# Patient Record
Sex: Female | Born: 1935 | Race: Black or African American | Hispanic: No | Smoking: Never smoker
Health system: Southern US, Community
[De-identification: ages and names within clinical notes are randomized; demographics above are authoritative.]

## PROBLEM LIST (undated history)

## (undated) DIAGNOSIS — E119 Type 2 diabetes mellitus without complications: Secondary | ICD-10-CM

## (undated) DIAGNOSIS — F0390 Unspecified dementia without behavioral disturbance: Secondary | ICD-10-CM

## (undated) DIAGNOSIS — I10 Essential (primary) hypertension: Secondary | ICD-10-CM

## (undated) DIAGNOSIS — E785 Hyperlipidemia, unspecified: Secondary | ICD-10-CM

## (undated) DIAGNOSIS — I509 Heart failure, unspecified: Secondary | ICD-10-CM

## (undated) DIAGNOSIS — J449 Chronic obstructive pulmonary disease, unspecified: Secondary | ICD-10-CM

## (undated) DIAGNOSIS — I2510 Atherosclerotic heart disease of native coronary artery without angina pectoris: Secondary | ICD-10-CM

## (undated) HISTORY — PX: AORTIC VALVE REPLACEMENT (AVR)/CORONARY ARTERY BYPASS GRAFTING (CABG): SHX5725

## (undated) HISTORY — DX: Hyperlipidemia, unspecified: E78.5

## (undated) HISTORY — PX: TONSILLECTOMY: SUR1361

## (undated) HISTORY — DX: Unspecified dementia, unspecified severity, without behavioral disturbance, psychotic disturbance, mood disturbance, and anxiety: F03.90

---

## 2004-05-01 ENCOUNTER — Ambulatory Visit: Payer: Self-pay | Admitting: Internal Medicine

## 2005-05-30 ENCOUNTER — Ambulatory Visit: Payer: Self-pay | Admitting: Internal Medicine

## 2007-02-21 ENCOUNTER — Emergency Department: Payer: Self-pay | Admitting: Emergency Medicine

## 2007-06-11 ENCOUNTER — Ambulatory Visit: Payer: Self-pay | Admitting: Internal Medicine

## 2007-08-04 ENCOUNTER — Ambulatory Visit: Payer: Self-pay | Admitting: Family Medicine

## 2008-06-14 ENCOUNTER — Ambulatory Visit: Payer: Self-pay | Admitting: Internal Medicine

## 2010-03-21 ENCOUNTER — Ambulatory Visit: Payer: Self-pay | Admitting: Internal Medicine

## 2011-06-01 ENCOUNTER — Emergency Department: Payer: Self-pay | Admitting: Emergency Medicine

## 2011-11-26 DIAGNOSIS — I2510 Atherosclerotic heart disease of native coronary artery without angina pectoris: Secondary | ICD-10-CM | POA: Insufficient documentation

## 2011-11-26 DIAGNOSIS — Z952 Presence of prosthetic heart valve: Secondary | ICD-10-CM | POA: Insufficient documentation

## 2011-11-26 DIAGNOSIS — E785 Hyperlipidemia, unspecified: Secondary | ICD-10-CM | POA: Insufficient documentation

## 2011-11-26 DIAGNOSIS — H9191 Unspecified hearing loss, right ear: Secondary | ICD-10-CM | POA: Insufficient documentation

## 2011-11-26 DIAGNOSIS — I10 Essential (primary) hypertension: Secondary | ICD-10-CM | POA: Insufficient documentation

## 2012-04-22 DIAGNOSIS — Z7901 Long term (current) use of anticoagulants: Secondary | ICD-10-CM | POA: Insufficient documentation

## 2012-06-16 ENCOUNTER — Ambulatory Visit: Payer: Self-pay

## 2012-12-19 ENCOUNTER — Emergency Department: Payer: Self-pay | Admitting: Emergency Medicine

## 2014-04-21 DIAGNOSIS — I5032 Chronic diastolic (congestive) heart failure: Secondary | ICD-10-CM | POA: Insufficient documentation

## 2015-10-06 DIAGNOSIS — M545 Low back pain, unspecified: Secondary | ICD-10-CM | POA: Insufficient documentation

## 2015-10-06 DIAGNOSIS — E119 Type 2 diabetes mellitus without complications: Secondary | ICD-10-CM | POA: Insufficient documentation

## 2015-10-06 DIAGNOSIS — G8929 Other chronic pain: Secondary | ICD-10-CM | POA: Insufficient documentation

## 2016-01-04 DIAGNOSIS — Z6835 Body mass index (BMI) 35.0-35.9, adult: Secondary | ICD-10-CM | POA: Insufficient documentation

## 2016-01-04 DIAGNOSIS — Z6841 Body Mass Index (BMI) 40.0 and over, adult: Secondary | ICD-10-CM | POA: Insufficient documentation

## 2016-11-16 ENCOUNTER — Emergency Department: Payer: Medicare Other

## 2016-11-16 ENCOUNTER — Emergency Department
Admission: EM | Admit: 2016-11-16 | Discharge: 2016-11-17 | Disposition: A | Discharge status: Home / Self Care | Payer: Medicare Other | Attending: Emergency Medicine | Admitting: Emergency Medicine

## 2016-11-16 ENCOUNTER — Encounter: Payer: Self-pay | Admitting: Emergency Medicine

## 2016-11-16 DIAGNOSIS — Y9301 Activity, walking, marching and hiking: Secondary | ICD-10-CM | POA: Insufficient documentation

## 2016-11-16 DIAGNOSIS — W01198A Fall on same level from slipping, tripping and stumbling with subsequent striking against other object, initial encounter: Secondary | ICD-10-CM | POA: Diagnosis not present

## 2016-11-16 DIAGNOSIS — I110 Hypertensive heart disease with heart failure: Secondary | ICD-10-CM | POA: Insufficient documentation

## 2016-11-16 DIAGNOSIS — J449 Chronic obstructive pulmonary disease, unspecified: Secondary | ICD-10-CM | POA: Insufficient documentation

## 2016-11-16 DIAGNOSIS — S52501A Unspecified fracture of the lower end of right radius, initial encounter for closed fracture: Secondary | ICD-10-CM | POA: Diagnosis not present

## 2016-11-16 DIAGNOSIS — Z7901 Long term (current) use of anticoagulants: Secondary | ICD-10-CM | POA: Diagnosis not present

## 2016-11-16 DIAGNOSIS — Y92002 Bathroom of unspecified non-institutional (private) residence single-family (private) house as the place of occurrence of the external cause: Secondary | ICD-10-CM | POA: Insufficient documentation

## 2016-11-16 DIAGNOSIS — I2510 Atherosclerotic heart disease of native coronary artery without angina pectoris: Secondary | ICD-10-CM | POA: Diagnosis not present

## 2016-11-16 DIAGNOSIS — Z79899 Other long term (current) drug therapy: Secondary | ICD-10-CM | POA: Insufficient documentation

## 2016-11-16 DIAGNOSIS — I509 Heart failure, unspecified: Secondary | ICD-10-CM | POA: Diagnosis not present

## 2016-11-16 DIAGNOSIS — S40021A Contusion of right upper arm, initial encounter: Secondary | ICD-10-CM | POA: Diagnosis not present

## 2016-11-16 DIAGNOSIS — S8011XA Contusion of right lower leg, initial encounter: Secondary | ICD-10-CM | POA: Diagnosis not present

## 2016-11-16 DIAGNOSIS — Y999 Unspecified external cause status: Secondary | ICD-10-CM | POA: Insufficient documentation

## 2016-11-16 DIAGNOSIS — Z7982 Long term (current) use of aspirin: Secondary | ICD-10-CM | POA: Insufficient documentation

## 2016-11-16 DIAGNOSIS — S0003XA Contusion of scalp, initial encounter: Secondary | ICD-10-CM | POA: Diagnosis not present

## 2016-11-16 DIAGNOSIS — S59911A Unspecified injury of right forearm, initial encounter: Secondary | ICD-10-CM | POA: Diagnosis present

## 2016-11-16 DIAGNOSIS — R5381 Other malaise: Secondary | ICD-10-CM

## 2016-11-16 DIAGNOSIS — S60211A Contusion of right wrist, initial encounter: Secondary | ICD-10-CM

## 2016-11-16 DIAGNOSIS — E119 Type 2 diabetes mellitus without complications: Secondary | ICD-10-CM | POA: Insufficient documentation

## 2016-11-16 DIAGNOSIS — R262 Difficulty in walking, not elsewhere classified: Secondary | ICD-10-CM

## 2016-11-16 HISTORY — DX: Essential (primary) hypertension: I10

## 2016-11-16 HISTORY — DX: Chronic obstructive pulmonary disease, unspecified: J44.9

## 2016-11-16 HISTORY — DX: Type 2 diabetes mellitus without complications: E11.9

## 2016-11-16 HISTORY — DX: Heart failure, unspecified: I50.9

## 2016-11-16 HISTORY — DX: Atherosclerotic heart disease of native coronary artery without angina pectoris: I25.10

## 2016-11-16 LAB — CBC WITH DIFFERENTIAL/PLATELET
BASOS ABS: 0.1 10*3/uL (ref 0–0.1)
Basophils Relative: 1 %
Eosinophils Absolute: 0.7 10*3/uL (ref 0–0.7)
Eosinophils Relative: 6 %
HEMATOCRIT: 33.6 % — AB (ref 35.0–47.0)
Hemoglobin: 11 g/dL — ABNORMAL LOW (ref 12.0–16.0)
LYMPHS ABS: 1.1 10*3/uL (ref 1.0–3.6)
Lymphocytes Relative: 9 %
MCH: 29.3 pg (ref 26.0–34.0)
MCHC: 32.8 g/dL (ref 32.0–36.0)
MCV: 89.3 fL (ref 80.0–100.0)
MONO ABS: 0.7 10*3/uL (ref 0.2–0.9)
Monocytes Relative: 6 %
Neutro Abs: 9.3 10*3/uL — ABNORMAL HIGH (ref 1.4–6.5)
Neutrophils Relative %: 78 %
Platelets: 173 10*3/uL (ref 150–440)
RBC: 3.77 MIL/uL — AB (ref 3.80–5.20)
RDW: 18.8 % — AB (ref 11.5–14.5)
WBC: 11.9 10*3/uL — ABNORMAL HIGH (ref 3.6–11.0)

## 2016-11-16 LAB — BASIC METABOLIC PANEL
Anion gap: 6 (ref 5–15)
BUN: 27 mg/dL — AB (ref 6–20)
CHLORIDE: 108 mmol/L (ref 101–111)
CO2: 25 mmol/L (ref 22–32)
CREATININE: 1.01 mg/dL — AB (ref 0.44–1.00)
Calcium: 8.5 mg/dL — ABNORMAL LOW (ref 8.9–10.3)
GFR calc Af Amer: 58 mL/min — ABNORMAL LOW (ref >60)
GFR calc non Af Amer: 50 mL/min — ABNORMAL LOW (ref >60)
Glucose, Bld: 122 mg/dL — ABNORMAL HIGH (ref 65–99)
Potassium: 4.6 mmol/L (ref 3.5–5.1)
Sodium: 139 mmol/L (ref 135–145)

## 2016-11-16 LAB — PROTIME-INR
INR: 3.13
Prothrombin Time: 32.9 seconds — ABNORMAL HIGH (ref 11.4–15.2)

## 2016-11-16 NOTE — ED Triage Notes (Signed)
Pt presents to ED via [facility] from home c/o mechanical fall with head involvement. Pt states she was walking in the bathroom with her knee high stockings on, slipped, and struck head against wall. Denies LOC. Abrasion noted to top of head, bleeding controlled. Hematomas noted to R upper arm and R lower leg. On coumadin.

## 2016-11-16 NOTE — Discharge Instructions (Signed)
Your wrist xray shows a small area of possible fracture at the end of the forearm.  Keep the splint on to protect this area.  Your xrays of the upper arm, hip, and lower leg are unremarkable. Your CT scan of the head is unremarkable.

## 2016-11-16 NOTE — ED Provider Notes (Signed)
[facility] Emergency Department Provider Note  ____________________________________________  Time seen: Approximately 9:49 PM  I have reviewed the triage vital signs and the nursing notes.   HISTORY  Chief Complaint Fall    HPI Rhonda Saunders is a 83 y.o. female reports that about 7 PM tonight she was getting up from the toilet when she slipped on a bathroom rug. She fell onto her right side and hit the top of her head on the wall. Denies loss of consciousness or vomiting. Denies neck pain. She has pain in the right arm hip and shin. Also pain in the right wrist. She is on Coumadin for mechanical heart valve.  Pain has been constant since the fall, worse with movement no alleviating factors. Moderate intensity. Described as Aching.    Past Medical History:  Diagnosis Date  . CHF (congestive heart failure) ([place])   . COPD (chronic obstructive pulmonary disease) ([place])   . Coronary artery disease   . Diabetes mellitus without complication ([place])   . Hypertension      There are no active problems to display for this patient.    Past Surgical History:  Procedure Laterality Date  . AORTIC VALVE REPLACEMENT (AVR)/CORONARY ARTERY BYPASS GRAFTING (CABG)    . TONSILLECTOMY       Prior to Admission medications   Not on File   Coumadin  Allergies Pentazocine lactate [pentazocine]   History reviewed. No pertinent family history.  Social History Social History  Substance Use Topics  . Smoking status: Never Smoker  . Smokeless tobacco: Never Used  . Alcohol use No    Review of Systems  Constitutional:   No fever or chills.  ENT:   No sore throat. No rhinorrhea. Lymphatic: No swollen glands, No extremity swelling Endocrine: No hot/cold flashes. No significant weight change. No neck swelling. Cardiovascular:   No chest pain or syncope. Respiratory:   No dyspnea or cough. Gastrointestinal:   Negative for abdominal pain, vomiting and diarrhea.   Genitourinary:   Negative for dysuria or difficulty urinating. Musculoskeletal:  Diffuse right-sided pain as above. No neck pain. Neurological:   Generalized headache. No numbness tingling weakness or paresthesia.. All other systems reviewed and are negative except as documented above in ROS and HPI.  ____________________________________________   PHYSICAL EXAM:  VITAL SIGNS: ED Triage Vitals  Enc Vitals Group     BP 11/16/16 2100 (!) 148/40     Pulse Rate 11/16/16 2100 66     Resp 11/16/16 2100 18     Temp 11/16/16 2100 97.5 F (36.4 C)     Temp Source 11/16/16 2100 Oral     SpO2 11/16/16 2100 99 %     Weight 11/16/16 2100 260 lb (117.9 kg)     Height 11/16/16 2100 5\' 2"  (1.575 m)     Head Circumference --      Peak Flow --      Pain Score 11/16/16 2059 8     Pain Loc --      Pain Edu? --      Excl. in [place]? --     Vital signs reviewed, nursing assessments reviewed.   Constitutional:   Alert and oriented. Well appearing and in no distress. Eyes:   No scleral icterus. No conjunctival pallor. PERRL. EOMI.  No nystagmus. ENT   Head:   Normocephalic With contusion and skin abrasion at the apex. No laceration. No hemotympanum.. .   Nose:   No congestion/rhinnorhea. No septal hematoma  Mouth/Throat:   MMM, no pharyngeal erythema. No peritonsillar mass.    Neck:   No stridor. No SubQ emphysema. No meningismus. Hematological/Lymphatic/Immunilogical:   No cervical lymphadenopathy. Cardiovascular:   RRR. Symmetric bilateral radial and DP pulses.  No murmurs.  Respiratory:   Normal respiratory effort without tachypnea nor retractions. Breath sounds are clear and equal bilaterally. No wheezes/rales/rhonchi. Gastrointestinal:   Soft and nontender. Non distended. There is no CVA tenderness.  No rebound, rigidity, or guarding. Genitourinary:   deferred Musculoskeletal:   No midline spinal tenderness. Ecchymosis and tenderness diffusely along the humerus. Ecchymosis  tenderness and swelling at the right wrist. Ecchymosis and tenderness along the right tibia. Tenderness at the right hip as well without ecchymosis.. Neurologic:   Normal speech and language.  CN 2-10 normal. Motor grossly intact. No gross focal neurologic deficits are appreciated.  Skin:    Skin is warm, dry and intact. Multiple areas of ecchymosis as above. No rash noted.  No petechiae, purpura, or bullae.  ____________________________________________    LABS (pertinent positives/negatives) (all labs ordered are listed, but only abnormal results are displayed) Labs Reviewed  PROTIME-INR - Abnormal; Notable for the following:       Result Value   Prothrombin Time 32.9 (*)    All other components within normal limits  BASIC METABOLIC PANEL - Abnormal; Notable for the following:    Glucose, Bld 122 (*)    BUN 27 (*)    Creatinine, Ser 1.01 (*)    Calcium 8.5 (*)    GFR calc non Af Amer 50 (*)    GFR calc Af Amer 58 (*)    All other components within normal limits  CBC WITH DIFFERENTIAL/PLATELET - Abnormal; Notable for the following:    WBC 11.9 (*)    RBC 3.77 (*)    Hemoglobin 11.0 (*)    HCT 33.6 (*)    RDW 18.8 (*)    Neutro Abs 9.3 (*)    All other components within normal limits   ____________________________________________   EKG    ____________________________________________    RADIOLOGY  Dg Wrist Complete Right  Result Date: 11/16/2016 CLINICAL DATA:  Pain after fall. EXAM: RIGHT WRIST - COMPLETE 3+ VIEW COMPARISON:  None. FINDINGS: Vascular calcifications are identified. Soft tissue swelling is identified along the radial aspect of the wrist. Mild irregularity of the distal radius. No carpal bone fractures identified. IMPRESSION: Mild irregularity along the radial aspect of the distal radius with overlying soft tissue swelling could represent a very subtle nondisplaced fracture. Electronically Signed   By: Dorise Bullion III M.D   On: 11/16/2016 22:28   Dg  Tibia/fibula Right  Result Date: 11/16/2016 CLINICAL DATA:  Pain after fall EXAM: RIGHT TIBIA AND FIBULA - 2 VIEW COMPARISON:  None. FINDINGS: There is no evidence of fracture or other focal bone lesions. Soft tissues are unremarkable. IMPRESSION: Negative. Electronically Signed   By: Dorise Bullion III M.D   On: 11/16/2016 22:30   Ct Head Wo Contrast  Result Date: 11/16/2016 CLINICAL DATA:  83 year old female with fall and head injury. Patient is on Coumadin. EXAM: CT HEAD WITHOUT CONTRAST TECHNIQUE: Contiguous axial images were obtained from the base of the skull through the vertex without intravenous contrast. COMPARISON:  None. FINDINGS: Brain: The ventricles and sulci appropriate size for patient's age. Mild periventricular and deep white matter chronic microvascular ischemic changes noted. There is no acute intracranial hemorrhage. No mass effect or midline shift noted. No extra-axial fluid collections. Vascular: No hyperdense  vessel or unexpected calcification. Skull: Osteopenia with the mineralization.  No acute fracture. Sinuses/Orbits: Mild mucoperiosteal thickening of paranasal sinuses. No air-fluid levels. The mastoid air cells are clear. Other: None IMPRESSION: 1. No acute intracranial hemorrhage. 2. Mild age-related atrophy and chronic microvascular ischemic changes. Electronically Signed   By: Anner Crete M.D.   On: 11/16/2016 21:50   Dg Humerus Right  Result Date: 11/16/2016 CLINICAL DATA:  Mechanical fall at home. EXAM: RIGHT HUMERUS - 2+ VIEW COMPARISON:  None. FINDINGS: There is no evidence of fracture or other focal bone lesions. Osteopenia. Soft tissues are nonsuspicious. Coarse calcifications project in RIGHT breast, previously described on mammogram March 21, 2010 though images are not available for direct comparison. IMPRESSION: Negative. Electronically Signed   By: Elon Alas M.D.   On: 11/16/2016 22:25   Dg Hip Unilat W Or Wo Pelvis 2-3 Views Right  Result Date:  11/16/2016 CLINICAL DATA:  Pain after fall EXAM: DG HIP (WITH OR WITHOUT PELVIS) 2-3V RIGHT COMPARISON:  None. FINDINGS: There is no evidence of hip fracture or dislocation. There is no evidence of arthropathy or other focal bone abnormality. IMPRESSION: Negative. Electronically Signed   By: Dorise Bullion III M.D   On: 11/16/2016 22:29    ____________________________________________   PROCEDURES Procedures SPLINT APPLICATION Date/Time: 32:44 PM Authorized by: Carrie Mew Consent: Verbal consent obtained. Risks and benefits: risks, benefits and alternatives were discussed Consent given by: patient Splint applied by: orthopedic technician Location details: Right wrist  Splint type: Thumb spica  Supplies used: Ortho-Glass  Post-procedure: The splinted body part was neurovascularly unchanged following the procedure. Patient tolerance: Patient tolerated the procedure well with no immediate complications.    ____________________________________________   INITIAL IMPRESSION / ASSESSMENT AND PLAN / ED COURSE  Pertinent labs & imaging results that were available during my care of the patient were reviewed by me and considered in my medical decision making (see chart for details).    Clinical Course as of Nov 17 2338  Sat Nov 16, 2016  2111 Pt p/w mech. Fall. On coumadin. Multiple areas of pain, ecchymosis. Will get XR. Check CT head. No lacerations.   [PS]    Clinical Course User Index [PS] Carrie Mew, MD    ----------------------------------------- 11:40 PM on 11/16/2016 -----------------------------------------  Workup negative except for a small possible fracture at the distal radius. We'll splint this area with a thumb spica for protection, follow up with primary care and orthopedics. Otherwise medically stable without any significant traumatic injuries. INR is at goal and not supratherapeutic. Suitable for outpatient follow-up. Low suspicion of significant  hemorrhage into the soft tissues or occult traumatic injury.   ____________________________________________   FINAL CLINICAL IMPRESSION(S) / ED DIAGNOSES  Final diagnoses:  Contusion of scalp, initial encounter  Closed fracture of distal end of right radius, unspecified fracture morphology, initial encounter  Contusion of right wrist, initial encounter  Contusion of right upper arm, initial encounter  Contusion of right lower leg, initial encounter      New Prescriptions   No medications on file     Portions of this note were generated with dragon dictation software. Dictation errors may occur despite best attempts at proofreading.    Carrie Mew, MD 11/16/16 2340

## 2016-11-17 DIAGNOSIS — S52501A Unspecified fracture of the lower end of right radius, initial encounter for closed fracture: Secondary | ICD-10-CM | POA: Diagnosis not present

## 2016-11-17 MED ORDER — WARFARIN SODIUM 5 MG PO TABS
5.0000 mg | ORAL_TABLET | Freq: Every day | ORAL | Status: DC
  Filled 2016-11-17: qty 1

## 2016-11-17 MED ORDER — METOPROLOL SUCCINATE ER 50 MG PO TB24
50.0000 mg | ORAL_TABLET | Freq: Every day | ORAL | Status: DC
  Filled 2016-11-17: qty 1

## 2016-11-17 MED ORDER — GABAPENTIN 600 MG PO TABS
600.0000 mg | ORAL_TABLET | Freq: Two times a day (BID) | ORAL | Status: DC
  Filled 2016-11-17: qty 1

## 2016-11-17 MED ORDER — WARFARIN - PHARMACIST DOSING INPATIENT
Freq: Every day | Status: DC
Start: 2016-11-17 — End: 2016-11-17
  Filled 2016-11-17 (×4): qty 1

## 2016-11-17 MED ORDER — OXYCODONE HCL ER 10 MG PO T12A
10.0000 mg | EXTENDED_RELEASE_TABLET | Freq: Two times a day (BID) | ORAL | Status: DC
  Filled 2016-11-17: qty 1

## 2016-11-17 MED ORDER — POTASSIUM CHLORIDE CRYS ER 20 MEQ PO TBCR: 20.0000 meq | EXTENDED_RELEASE_TABLET | Freq: Two times a day (BID) | ORAL | Status: DC

## 2016-11-17 MED ORDER — WARFARIN - PHARMACIST DOSING INPATIENT: Freq: Every day | Status: DC

## 2016-11-17 MED ORDER — ATORVASTATIN CALCIUM 20 MG PO TABS
80.0000 mg | ORAL_TABLET | Freq: Every day | ORAL | Status: DC
  Filled 2016-11-17: qty 4

## 2016-11-17 MED ORDER — RAMIPRIL 10 MG PO CAPS
10.0000 mg | ORAL_CAPSULE | Freq: Every day | ORAL | Status: DC
  Filled 2016-11-17: qty 1

## 2016-11-17 MED ORDER — FUROSEMIDE 40 MG PO TABS
80.0000 mg | ORAL_TABLET | Freq: Every day | ORAL | Status: DC
  Filled 2016-11-17: qty 2

## 2016-11-17 MED ORDER — ASPIRIN EC 81 MG PO TBEC
81.0000 mg | DELAYED_RELEASE_TABLET | Freq: Every day | ORAL | Status: DC
  Filled 2016-11-17: qty 1

## 2016-11-17 MED ORDER — MOMETASONE FURO-FORMOTEROL FUM 200-5 MCG/ACT IN AERO
2.0000 | INHALATION_SPRAY | Freq: Two times a day (BID) | RESPIRATORY_TRACT | Status: DC
  Filled 2016-11-17: qty 8.8

## 2016-11-17 NOTE — ED Notes (Signed)
Ambulated in room with PT using walker with arm shelf.  Patient tolerated well.   See PT note for details.  Patient's daughter at bedside.

## 2016-11-17 NOTE — ED Notes (Signed)
[place] daughter in [employer]

## 2016-11-17 NOTE — ED Notes (Signed)
Verified home medications with Pt's Daughter who had brought in the medications with her. Upon trying to update patients profile, the computer glitched and thinks that Dr. Karma Greaser is in the profile. We attempted several times to fix that but it still is the same. I added the medications the best I could a separate way from the outside reconcile tab and gave the list of meds to Dr Karma Greaser so he could place the orders. The med reconciliation is complete, it just cant be marked complete due to the glitch in the system.

## 2016-11-17 NOTE — ED Provider Notes (Signed)
-----------------------------------------   1:15 AM on 11/17/2016 -----------------------------------------  I have had 2 separate conversations with the patient and her son and daughter who are both present at bedside.  The son agrees with the plan to take the patient home.  The daughter is extremely agitated and angry with our plan for discharge.  She explains that she has been caring for the patient for years and she is the only adult child who has been able and willing to care for her.  She says the patient has a hard enough time getting up and around now, and after her fall and with a splint on her right hand she cannot care for herself.  Nursing confirms that the patient was not able to get up to her feet even with the assistance of a person on either side of her.  The daughter is extremely focal at the nursing station about how we cannot kick her mother out, how she is "done", she cannot provide any more care, and she is going to [place] on Tuesday for her daughter's wedding and she has "no more to give."  She wants her mother placed in a facility that can provide the level of care she believes her mother requires.  Again, nursing confirms that the patient cannot stand on her own and it seems that the patient will have tremendous difficulties conducting her activities of daily living.  We have no choice at this point but to keep her in boarding status in the emergency department.  I have ordered physical therapy/occupational therapy, case management, and social work consults.  I have ordered a hospital bed for her.  Once her medication regimen has been verified, I will order her home meds.  The son is not completely in agreement with this plan but at this point this is most likely the safest solution particularly given the daughter's vociferous objections to discharge.  The patient and family understand that she will likely be in the emergency department for well more than 24 hours until placement can  be found.   Hinda Kehr, MD 11/17/16 [phone]

## 2016-11-17 NOTE — ED Provider Notes (Signed)
Patient signed out to me by Dr. Karma Greaser who is pending evaluation by social work as well as physical therapy for placement. Patient was recommended for rehabilitation by physical therapy. However, the family says that this is cost prohibitive and is therefore asking that the patient be sent home.  Physical Exam  BP 134/64 (BP Location: Left Arm)   Pulse 68   Temp 97.5 F (36.4 C) (Oral)   Resp 16   Ht 5\' 2"  (1.575 m)   Wt 260 lb (117.9 kg)   SpO2 97%   BMI 47.55 kg/m  ----------------------------------------- 1:47 PM on 11/17/2016 -----------------------------------------   Physical Exam No objective signs of distress this time. Wearing the right upper extremity splint. ED Course  Procedures  MDM Patient evaluated by physical therapy and recommended for a dilatation but the family is wishing to go home. Patient will go home with a rolling walker which is supposed to be delivered in several hours. Patient will also have PT and OT at home as well as a home health aide. The patient is accompanied by her son at this time. They're understanding of this plan and willing to comply.       Rhonda Pyo, MD 11/17/16 [phone]

## 2016-11-17 NOTE — Care Management Note (Signed)
Case Management Note  Patient Details  Name: Rhonda Saunders MRN: [id number] Date of Birth: [birth year]  Subjective/Objective:                    Action/Plan:   Expected Discharge Date:                  Expected Discharge Plan:     In-House Referral:     Discharge planning Services     Post Acute Care Choice:    Choice offered to:     DME Arranged:    DME Agency:     HH Arranged:    HH Agency:     Status of Service:     If discussed at [employer] of Stay Meetings, dates discussed:    Additional Comments:Spoke with Tobin Chad [phone] at [place] will deliver walker today to patient's residence. Family imformed. Case closed  Ival Bible, RN 11/17/2016, 2:10 PM

## 2016-11-17 NOTE — Clinical Social Work Note (Signed)
Clinical Social Work Assessment  Patient Details  Name: Rhonda Saunders MRN: [id number] Date of Birth: Jan 13, 1932  Date of referral:  11/17/16               Reason for consult:  Facility Placement                Permission sought to share information with:  Family Supports, Customer service manager Permission granted to share information::  Yes, Verbal Permission Granted  Name::     Rhonda Saunders [phone]  Agency::  All facilities  Relationship::     Contact Information:     Housing/Transportation Living arrangements for the past 2 months:  [place] of Information:  Adult Children Patient Interpreter Needed:  None Criminal Activity/Legal Involvement Pertinent to Current Situation/Hospitalization:  No - Comment as needed Significant Relationships:  Adult Children Lives with:  Self Do you feel safe going back to the place where you live?  No Need for family participation in patient care:  Yes (Comment)  Care giving concerns: Family reports ( daughter) unable to provide any further care ( son) not in agreement   Social Worker assessment / plan:   LCSW introduced myself to patient and she agreed I could call her family to help with her assessments. Pt was encouraged to return to rest.  Rhonda Saunders is a 83 y.o. female reports  she slipped on a bathroom rug. She fell onto her right side and hit the top of her head on the wall. Denies loss of consciousness or vomiting. Denies neck pain. She has pain in the right arm hip and shin. Also pain in the right wrist. She is on Coumadin for mechanical heart valve.Daughter reports she is a diabetic however all medications do not reflect this diagnosis. Family was asked and they reported she was diagnosed diabetic but has never received any medication for this. Patient up to 3 days lives on her own and very independent/ambulatory and continent. She does have some family members follow up and help her. Patient is alert  and oriented x4. She has good vision, wears glasses, speaks well but is hard of hearing. It was explained at length to patient son that his mother does not meet in patient 3 night hospital stay as per medicare therefore if they would like her in a SNF- they will have to have private pay plan. LCSW will provide several resources for this family ( private pay and homemakers list.) Family is looking for short term re-hab. LCSW awaiting PT and OT consult and will advise family of results. Currently patient is unable to ambulate or dress or feed herself because of bruising and sprain on arm.  Employment status:  Retired Forensic scientist:  [employer]) PT Recommendations:   (PT and OT consult Pending) Information / Referral to community resources:  [place], Other (Comment Required) (Elder Editor, commissioning created for family)  Patient/Family's Response to care:  Patient and family understand she needs more help at this time  Patient/Family's Understanding of and Emotional Response to Diagnosis, Current Treatment, and Prognosis: All parties have a good understanding of process and supports to assist their mother.  Emotional Assessment Appearance:  Appears stated age Attitude/Demeanor/Rapport:   (Polite) Affect (typically observed):  Accepting, Calm, Adaptable Orientation:  Oriented to Self, Oriented to Place, Oriented to  Time, Oriented to Situation Alcohol / Substance use:  Not Applicable Psych involvement (Current and /or in the community):  No (Comment)  Discharge Needs  Concerns to be addressed:  Discharge Planning Concerns Readmission within the last 30 days:    Current discharge risk:    Barriers to Discharge:  Family Issues, Continued Medical Work up   [place], [place] 11/17/2016, 8:12 AM

## 2016-11-17 NOTE — Progress Notes (Signed)
LCSW consulted with EDP and OT and PT consult pending.  LCSW consulted with ED RN and obtained information on patient and family issues. Patient was sleeping soundly so LCSW called family to complete patients assessment. She does not have a HCPOA.LCSW spoke to [facility] and Mickie Hillier and collected information to complete assessment Fl2 and passr number request.  It was explained to family that patient does not have a 3 night qualifying stay as required by Medicare/BCBS therefore they could have a SNF- Private pay for rehab, it was explained most facilities require 30 up front. LCSW agreed to put together resources that can assist family with future care concerns.  Completed assessment, Fl2 and Passr and awaiting PT OT consults.   [employer] LCSW [phone]

## 2016-11-17 NOTE — NC FL2 (Cosign Needed)
[place] LEVEL OF CARE SCREENING TOOL     IDENTIFICATION  Patient Name: Rhonda Saunders Birthdate: May 26, 1932 Sex: female Admission Date (Current Location): 11/16/2016  [place] and [place] Number:  Engineering geologist and Address:  [facility], [address] [place], [place] [address]      Provider Number: [id number]  Attending Physician Name and Address:  No att. providers found  Relative Name and Phone Number:       Current Level of Care: Hospital Recommended Level of Care: [place] Prior Approval Number:    Date Approved/Denied:   PASRR Number: [id number]  Discharge Plan: SNF    Current Diagnoses: There are no active problems to display for this patient.   Orientation RESPIRATION BLADDER Height & Weight     Self, Time, Situation, Place  Normal Continent Weight: 260 lb (117.9 kg) Height:  5\' 2"  (157.5 cm)  BEHAVIORAL SYMPTOMS/MOOD NEUROLOGICAL BOWEL NUTRITION STATUS      Continent Diet (Normal/diebetic)  AMBULATORY STATUS COMMUNICATION OF NEEDS Skin   Limited Assist Verbally Bruising                       Personal Care Assistance Level of Assistance  Bathing, Feeding, Dressing, Total care Bathing Assistance: Limited assistance Feeding assistance: Limited assistance Dressing Assistance: Limited assistance Total Care Assistance: Limited assistance   Functional Limitations Info  Sight, Hearing, Speech Sight Info: Adequate Hearing Info: Impaired Speech Info: Adequate    SPECIAL CARE FACTORS FREQUENCY  PT (By licensed PT), OT (By licensed OT)     PT Frequency: x5 OT Frequency: x5            Contractures Contractures Info: Not present    Additional Factors Info  Code Status, Allergies Code Status Info: full code Allergies Info: pentazocine lactate           Current Medications (11/17/2016):  This is the current hospital active medication list Current Facility-Administered  Medications  Medication Dose Route Frequency Provider Last Rate Last Dose  . aspirin EC tablet 81 mg  81 mg Oral Daily Hinda Kehr, MD      . atorvastatin (LIPITOR) tablet 80 mg  80 mg Oral Daily Hinda Kehr, MD      . furosemide (LASIX) tablet 80 mg  80 mg Oral Daily Hinda Kehr, MD      . gabapentin (NEURONTIN) tablet 600 mg  600 mg Oral BID Hinda Kehr, MD      . metoprolol succinate (TOPROL-XL) 24 hr tablet 50 mg  50 mg Oral Daily Hinda Kehr, MD      . mometasone-formoterol [employer]) 200-5 MCG/ACT inhaler 2 puff  2 puff Inhalation BID Hinda Kehr, MD      . oxyCODONE (OXYCONTIN) 12 hr tablet 10 mg  10 mg Oral Q12H Hinda Kehr, MD      . potassium chloride SA (K-DUR,KLOR-CON) CR tablet 20 mEq  20 mEq Oral BID Hinda Kehr, MD      . ramipril (ALTACE) capsule 10 mg  10 mg Oral Daily Hinda Kehr, MD      . warfarin (COUMADIN) tablet 5 mg  5 mg Oral q1800 Hinda Kehr, MD       Current Outpatient Prescriptions  Medication Sig Dispense Refill  . aspirin 81 MG tablet Take by mouth.    Marland Kitchen atorvastatin (LIPITOR) 80 MG tablet Take 1 tablet by mouth daily.    . Fluticasone-Salmeterol (ADVAIR DISKUS) 250-50 MCG/DOSE AEPB INHALE 1 PUFF INTO  THE LUNGS ONCE DAILY AS DIRECTED    . furosemide (LASIX) 40 MG tablet TAKE 2 TABLETS BY MOUTH EVERY MORNING AND TAKE 1 TABLET BY MOUTH EVERY EVENING    . gabapentin (NEURONTIN) 600 MG tablet Take by mouth.    . metoprolol succinate (TOPROL XL) 50 MG 24 hr tablet TAKE 1 TABLET BY MOUTH EVERY DAY.    Marland Kitchen potassium chloride SA (K-DUR,KLOR-CON) 20 MEQ tablet TAKE 1 TABLET BY MOUTH TWICE A DAY.    . ramipril (ALTACE) 10 MG capsule TAKE 1 CAPSULE BY MOUTH EVERY DAY.    Marland Kitchen warfarin (COUMADIN) 5 MG tablet Take by mouth.    . oxyCODONE (OXYCONTIN) 10 mg 12 hr tablet Take by mouth.       Discharge Medications: Please see discharge summary for a list of discharge medications.  Relevant Imaging Results:  Relevant Lab Results:   Additional Information SSN  [id number]  Joana Reamer, [place]

## 2016-11-17 NOTE — Evaluation (Signed)
Physical Therapy Evaluation Patient Details Name: Rhonda Saunders MRN: [id number] DOB: 05-May-1932 Today's Date: 11/17/2016   History of Present Illness  Pt is an 83 y.o. female s/p fall (getting up from toilet and slipped on bathroom rug) onto R side and hit head on wall sustaining pain to R arm, hip, shin, and wrist.  Imaging of R wrist showing mild irregularity along radial aspect of distal radius with overlying soft tissue swelling could represent a very subtle nondisplaced fx.  PMH includes CHF, COPD, CAD, DM, htn, AVR, CABG.  Clinical Impression  Prior to hospital admission, pt was modified independent ambulating with SPC.  Pt lives alone but has support from her granddaughter (that lives 3 minutes away) and her aunt; pt's granddaughter reporting they could provide 24/7 assist if needed/pt doesn't go to STR.  Pt has 12-15 steps to enter home to main floor with railing.  Currently pt is CGA supine to/from sit; varying between mod assist, min assist, and CGA with transfers during session; and CGA to min assist with ambulation 20 feet x2 (to bathroom and back) utilizing bariatric youth sized RW with R platform (pt required bariatric youth sized d/t pt's height and hip width).  Pt would benefit from skilled PT to address noted impairments and functional limitations.  Pt would benefit from discharge to STR when medically appropriate.    Follow Up Recommendations SNF    Equipment Recommendations  3in1 (PT) (Youth sized bariatric rolling walker with R platform)    Recommendations for Other Services OT consult     Precautions / Restrictions Precautions Precautions: Fall Required Braces or Orthoses: Other Brace/Splint Other Brace/Splint: R wrist thumb spica splint Restrictions Weight Bearing Restrictions:  (None specified in chart) Other Position/Activity Restrictions: Nursing reporting Dr. Clearnce Hasten clearing pt to use R platform walker.      Mobility  Bed Mobility Overal bed mobility: Needs  Assistance Bed Mobility: Supine to Sit; Sit to supine     Supine to sit: Min guard;HOB elevated  Sit to supine: Min guard; HOB elevated   General bed mobility comments: increased effort and time to perform (sat up on L side of bed); vc's to use side rail with L UE; vc's for R UE precautions  Transfers Overall transfer level: Needs assistance Equipment used: Right platform walker Transfers: Sit to/from Stand;Stand Pivot Transfers Sit to Stand: Mod assist;Min assist;Min guard Stand pivot transfers: Min assist (transfer to toilet)       General transfer comment: initially mod assist to stand from bed with vc's for UE positioning for safety; 2nd trial standing from bed pt required 4 attempts but eventually able to stand with CGA; pt requiring use of R grab bar next to toilet (using L UE reaching across her body) for sit to/from stand from low toilet (min assist to control descent and min assist to stand)  Ambulation/Gait Ambulation/Gait assistance: Min guard;Min assist Ambulation Distance (Feet):  (20 feet x2) Assistive device: Right platform walker   Gait velocity: decreased   General Gait Details: decreased B step length; vc's to stay closer to walker; mildly antalgic on R LE  Stairs            Wheelchair Mobility    Modified Rankin (Stroke Patients Only)       Balance Overall balance assessment: Needs assistance Sitting-balance support: No upper extremity supported;Feet supported Sitting balance-Leahy Scale: Good Sitting balance - Comments: reaching within BOS   Standing balance support: Single extremity supported Standing balance-Leahy Scale: Fair Standing balance comment:  standing for clothing management                             Pertinent Vitals/Pain Pain Assessment: 0-10 Pain Score: 4  Pain Location: R wrist; just distal to R knee Pain Descriptors / Indicators: Sore;Tender;Grimacing;Guarding Pain Intervention(s): Limited activity within  patient's tolerance;Monitored during session;Repositioned  Vitals (HR and O2 on room air) stable and WFL throughout treatment session.    Home Living Family/patient expects to be discharged to:: Private residence Living Arrangements: Alone Available Help at Discharge: Family;Available 24 hours/day Type of Home: House Home Access: Stairs to enter   [employer] of Steps: 12-15 (L rail from front of house and R rail from basement to main floor) Home Layout: Two level;Able to live on main level with bedroom/bathroom Home Equipment: Kasandra Knudsen - single point;Bedside commode (Unsure if has grab bars by toilet)      Prior Function Level of Independence: Needs assistance   Gait / Transfers Assistance Needed: Modified independent with ambulation using SPC.  Denies any other falls in past 6 months.  ADL's / Homemaking Assistance Needed: Assist getting in/out of tub occasionally for a "good cleaning"; (otherwise does sponge baths on her own).  Assist for cleaning and cooking.  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     Hand Dominance        Extremity/Trunk Assessment   Upper Extremity Assessment Upper Extremity Assessment:  (L UE generalized weakness; R UE at least 3/5 shoulder flexion and elbow flexion/extension (deferred R wrist/hand d/t splint and injury))    Lower Extremity Assessment Lower Extremity Assessment: Generalized weakness (at least 3/5 R knee flexion/extension (deferred MMT d/t lower leg pain where bruising was noted))       Communication   Communication: No difficulties  Cognition Arousal/Alertness: Awake/alert Behavior During Therapy: WFL for tasks assessed/performed Overall Cognitive Status: Within Functional Limits for tasks assessed                                        General Comments General comments (skin integrity, edema, etc.): Bruising noted superior R head and R lower leg; R wrist thumb spica splint in place.  Pt's  granddaughter present with pt end of session and discussed assist levels if pt did not discharge to STR (discussed R UE precautions;  24/7 assist needs; toileting/ADL and functional mobility needs/assist levels; DME needs including BSC, bariatric youth sized RW with R platform; and how to enter pt's home safely d/t significant number of stairs: pt's granddaughter said she is a CNA and would look into finding a w/c in order to bump pt up/down stairs for home accessibility).    Exercises  Transfer training   Assessment/Plan    PT Assessment Patient needs continued PT services  PT Problem List Decreased strength;Decreased activity tolerance;Decreased balance;Decreased mobility;Decreased knowledge of use of DME;Pain       PT Treatment Interventions DME instruction;Gait training;Stair training;Functional mobility training;Therapeutic activities;Therapeutic exercise;Balance training;Patient/family education    PT Goals (Current goals can be found in the Care Plan section)  Acute Rehab PT Goals Patient Stated Goal: to regain independence PT Goal Formulation: With patient Time For Goal Achievement: 12/01/16 Potential to Achieve Goals: Good    Frequency 7X/week   Barriers to discharge        Co-evaluation  End of Session Equipment Utilized During Treatment: Gait belt Activity Tolerance: Patient tolerated treatment well Patient left: in bed;with call bell/phone within reach;with bed alarm set;with family/visitor present Nurse Communication: Mobility status;Precautions;Weight bearing status PT Visit Diagnosis: Muscle weakness (generalized) (M62.81);History of falling (Z91.81);Difficulty in walking, not elsewhere classified (R26.2);Pain Pain - Right/Left: Right Pain - part of body: Leg;Hand    Time: 2707-8675 PT Time Calculation (min) (ACUTE ONLY): 50 min   Charges:   PT Evaluation $PT Eval Low Complexity: 1 Procedure PT Treatments $Therapeutic Activity: 23-37  mins   PT G Codes:   PT G-Codes **NOT FOR INPATIENT CLASS** Functional Assessment Tool Used: AM-PAC 6 Clicks Basic Mobility Functional Limitation: Mobility: Walking and moving around Mobility: Walking and Moving Around Current Status (Q4920): At least 40 percent but less than 60 percent impaired, limited or restricted Mobility: Walking and Moving Around Goal Status [id number]): At least 1 percent but less than 20 percent impaired, limited or restricted     Leitha Bleak, PT 11/17/16, 12:34 PM [phone]

## 2016-11-17 NOTE — ED Notes (Signed)
AAOx3.  Skin warm and dry.  NAD 

## 2016-11-17 NOTE — Progress Notes (Signed)
LCSW completed assessment started Fl2 and sent out information via the [facility] awaiting PT OT consults. Resource information placed on patient chart  [employer] LCSW [phone]

## 2016-11-17 NOTE — Care Management Note (Signed)
Case Management Note  Patient Details  Name: Rhonda Saunders MRN: [id number] Date of Birth: 1931/09/30  Subjective/Objective:  Home health services                   Action/Plan:PHysical therapy/ Occupational Therapy/ Home health aide / DME walker   Expected Discharge Date:                  Expected Discharge Plan:     In-House Referral:     Discharge planning Services     Post Acute Care Choice:    Choice offered to: [name] and daughter in law Rhonda Saunders [phone] choose Rhonda Saunders    DME Arranged: yes   DME [place] Arranged:yes    [place] [facility]:   [facility]  Status of Service:Delivery today Bariatric youth sized rolling walker with right platform     If discussed at Long Length of Stay Meetings, dates discussed:    Additional Comments:  Ival Bible, RN 11/17/2016, 1:21 PM

## 2016-11-22 NOTE — Progress Notes (Signed)
[facility]  Patient Status: Not taken under care, unable to locate patient for [facility] services. Notified Marshell Garfinkel, [place] 11/22/2016, 3:50 PM

## 2016-11-29 ENCOUNTER — Encounter: Payer: Self-pay | Admitting: Emergency Medicine

## 2016-11-29 ENCOUNTER — Emergency Department: Payer: Medicare Other

## 2016-11-29 ENCOUNTER — Emergency Department
Admission: EM | Admit: 2016-11-29 | Discharge: 2016-11-29 | Disposition: A | Discharge status: Home / Self Care | Payer: Medicare Other | Attending: Emergency Medicine | Admitting: Emergency Medicine

## 2016-11-29 DIAGNOSIS — Z7982 Long term (current) use of aspirin: Secondary | ICD-10-CM | POA: Insufficient documentation

## 2016-11-29 DIAGNOSIS — Z7951 Long term (current) use of inhaled steroids: Secondary | ICD-10-CM | POA: Diagnosis not present

## 2016-11-29 DIAGNOSIS — J449 Chronic obstructive pulmonary disease, unspecified: Secondary | ICD-10-CM | POA: Insufficient documentation

## 2016-11-29 DIAGNOSIS — Z79899 Other long term (current) drug therapy: Secondary | ICD-10-CM | POA: Diagnosis not present

## 2016-11-29 DIAGNOSIS — Z951 Presence of aortocoronary bypass graft: Secondary | ICD-10-CM | POA: Insufficient documentation

## 2016-11-29 DIAGNOSIS — R2231 Localized swelling, mass and lump, right upper limb: Secondary | ICD-10-CM | POA: Diagnosis present

## 2016-11-29 DIAGNOSIS — R609 Edema, unspecified: Secondary | ICD-10-CM

## 2016-11-29 DIAGNOSIS — I110 Hypertensive heart disease with heart failure: Secondary | ICD-10-CM | POA: Diagnosis not present

## 2016-11-29 DIAGNOSIS — E119 Type 2 diabetes mellitus without complications: Secondary | ICD-10-CM | POA: Insufficient documentation

## 2016-11-29 DIAGNOSIS — Z7901 Long term (current) use of anticoagulants: Secondary | ICD-10-CM | POA: Insufficient documentation

## 2016-11-29 DIAGNOSIS — M7989 Other specified soft tissue disorders: Secondary | ICD-10-CM

## 2016-11-29 DIAGNOSIS — I509 Heart failure, unspecified: Secondary | ICD-10-CM | POA: Insufficient documentation

## 2016-11-29 NOTE — ED Provider Notes (Signed)
[facility] Emergency Department Provider Note  ____________________________________________  Time seen: Approximately 3:15 PM  I have reviewed the triage vital signs and the nursing notes.   HISTORY  Chief Complaint Fall    HPI Rhonda Saunders is a 83 y.o. female who had a slip and fall at home about 2 weeks ago sustaining a nondisplaced fracture to the distal right radius. Patient was eventually discharged home from the emergency Department after appropriate care and evaluations had been performed. Today she was following up at the [facility] clinic walk-in clinic, and they noted that she has swelling of the right arm, so sent her to the ED for evaluation for possible DVT.  In discussing with the patient and her daughter at bedside, they report that the swelling has been there since the injury and is getting better day by day. They report they're not concerned about her symptoms but orally following up because they were scheduled to do so today. Denies any chest pain shortness of breath or worsening musculoskeletal pain. She followed up with orthopedics who removed the Ortho-Glass splint and gave her a prefabricated wrist brace..Overall she feels like all her symptoms are improving. The swelling in the arm and leg improved when she elevates them. She has chronic lower extremity edema bilaterally.     Past Medical History:  Diagnosis Date  . CHF (congestive heart failure) ([place])   . COPD (chronic obstructive pulmonary disease) ([place])   . Coronary artery disease   . Diabetes mellitus without complication ([place])   . Hypertension      There are no active problems to display for this patient.    Past Surgical History:  Procedure Laterality Date  . AORTIC VALVE REPLACEMENT (AVR)/CORONARY ARTERY BYPASS GRAFTING (CABG)    . TONSILLECTOMY       Prior to Admission medications   Medication Sig Start Date End Date Taking? Authorizing Provider  aspirin 81 MG tablet  Take by mouth. 05/19/08   [provider]  atorvastatin (LIPITOR) 80 MG tablet Take 1 tablet by mouth daily. 09/11/16   [provider]  Fluticasone-Salmeterol (ADVAIR DISKUS) 250-50 MCG/DOSE AEPB INHALE 1 PUFF INTO THE LUNGS ONCE DAILY AS DIRECTED 11/13/15   [provider]  furosemide (LASIX) 40 MG tablet TAKE 2 TABLETS BY MOUTH EVERY MORNING AND TAKE 1 TABLET BY MOUTH EVERY EVENING 11/05/16   [provider]  gabapentin (NEURONTIN) 600 MG tablet Take by mouth. 10/06/15   [provider]  metoprolol succinate (TOPROL XL) 50 MG 24 hr tablet TAKE 1 TABLET BY MOUTH EVERY DAY. 06/03/16   [provider]  oxyCODONE (OXYCONTIN) 10 mg 12 hr tablet Take by mouth.    [provider]  potassium chloride SA (K-DUR,KLOR-CON) 20 MEQ tablet TAKE 1 TABLET BY MOUTH TWICE A DAY. 03/11/16   [provider]  ramipril (ALTACE) 10 MG capsule TAKE 1 CAPSULE BY MOUTH EVERY DAY. 09/10/16   [provider]  warfarin (COUMADIN) 5 MG tablet Take by mouth. 10/10/16   [provider]     Allergies Pentazocine lactate [pentazocine]   History reviewed. No pertinent family history.  Social History Social History  Substance Use Topics  . Smoking status: Never Smoker  . Smokeless tobacco: Never Used  . Alcohol use No    Review of Systems  Constitutional:   No fever or chills.  ENT:   No sore throat. No rhinorrhea. Lymphatic: No swollen glands, No extremity swelling Endocrine: No hot/cold flashes. No significant weight  change. No neck swelling. Cardiovascular:   No chest pain or syncope. Respiratory:   No dyspnea or cough. Gastrointestinal:   Negative for abdominal pain, vomiting and diarrhea.  Genitourinary:   Negative for dysuria or difficulty urinating. Musculoskeletal:   Subacute right wrist pain. Chronic peripheral edema Neurological:   Negative for headaches or weakness. All other systems reviewed and are negative except  as documented above in ROS and HPI.  ____________________________________________   PHYSICAL EXAM:  VITAL SIGNS: ED Triage Vitals  Enc Vitals Group     BP 11/29/16 1219 (!) 124/54     Pulse Rate 11/29/16 1219 64     Resp 11/29/16 1219 20     Temp 11/29/16 1221 98.3 F (36.8 C)     Temp Source 11/29/16 1221 Oral     SpO2 11/29/16 1219 98 %     Weight 11/29/16 1219 234 lb (106.1 kg)     Height 11/29/16 1219 5\' 2"  (1.575 m)     Head Circumference --      Peak Flow --      Pain Score 11/29/16 1421 0     Pain Loc --      Pain Edu? --      Excl. in [place]? --     Vital signs reviewed, nursing assessments reviewed.   Constitutional:   Alert and oriented. Well appearing and in no distress. Eyes:   No scleral icterus. No conjunctival pallor. PERRL. EOMI.  No nystagmus. ENT   Head:   Normocephalic and atraumatic.   Nose:   No congestion/rhinnorhea. No septal hematoma   Mouth/Throat:   MMM, no pharyngeal erythema. No peritonsillar mass.    Neck:   No stridor. No SubQ emphysema. No meningismus. Hematological/Lymphatic/Immunilogical:   No cervical lymphadenopathy. Cardiovascular:   RRR. Symmetric bilateral radial and DP pulses.  No murmurs.  Respiratory:   Normal respiratory effort without tachypnea nor retractions. Breath sounds are clear and equal bilaterally. No wheezes/rales/rhonchi. Gastrointestinal:   Soft and nontender. Non distended. There is no CVA tenderness.  No rebound, rigidity, or guarding. Genitourinary:   deferred Musculoskeletal:   Slight tenderness at distal radius without deformity. There is soft swelling of the right forearm and hand without ecchymosis or inflammatory changes. Nontender except at the area of identified injury in the past. Both legs are edematous, 2+ pitting edema, symmetric. Some color changes consistent with chronic venous stasis. Extremities are warm with good pulses. No acute findings.. Neurologic:   Normal speech and language.  CN 2-10  normal. Motor grossly intact. No gross focal neurologic deficits are appreciated.  Skin:    Skin is warm, dry with swelling and skin findings as above..  ____________________________________________    LABS (pertinent positives/negatives) (all labs ordered are listed, but only abnormal results are displayed) Labs Reviewed - No data to display ____________________________________________   EKG    ____________________________________________    RADIOLOGY  US Venous Img Lower Unilateral Right  Result Date: 11/29/2016 CLINICAL DATA:  Right leg swelling for 2 days. EXAM: RIGHT LOWER EXTREMITY VENOUS DOPPLER ULTRASOUND TECHNIQUE: Gray-scale sonography with graded compression, as well as color Doppler and duplex ultrasound were performed to evaluate the lower extremity deep venous systems from the level of the common femoral vein and including the common femoral, femoral, profunda femoral, popliteal and calf veins including the posterior tibial, peroneal and gastrocnemius veins when visible. The superficial great saphenous vein was also interrogated. Spectral Doppler was utilized to evaluate flow at rest and with distal augmentation maneuvers in  the common femoral, femoral and popliteal veins. COMPARISON:  None. FINDINGS: Contralateral Common Femoral Vein: Respiratory phasicity is normal and symmetric with the symptomatic side. No evidence of thrombus. Normal compressibility. Common Femoral Vein: No evidence of thrombus. Normal compressibility, respiratory phasicity and response to augmentation. Saphenofemoral Junction: No evidence of thrombus. Normal compressibility and flow on color Doppler imaging. Profunda Femoral Vein: No evidence of thrombus. Normal compressibility and flow on color Doppler imaging. Femoral Vein: No evidence of thrombus. Normal compressibility, respiratory phasicity and response to augmentation. Popliteal Vein: No evidence of thrombus. Normal compressibility, respiratory  phasicity and response to augmentation. Calf Veins: No evidence of thrombus. Normal compressibility and flow on color Doppler imaging. Superficial Great Saphenous Vein: No evidence of thrombus. Normal compressibility and flow on color Doppler imaging. Venous Reflux:  None. Other Findings:  None. IMPRESSION: No evidence of DVT within the right lower extremity. Electronically Signed   By: Lajean Manes M.D.   On: 11/29/2016 13:50   US Venous Img Upper Uni Right  Result Date: 11/29/2016 CLINICAL DATA:  Right arm swelling for 2 days EXAM: RIGHT UPPER EXTREMITY VENOUS DOPPLER ULTRASOUND TECHNIQUE: Gray-scale sonography with graded compression, as well as color Doppler and duplex ultrasound were performed to evaluate the upper extremity deep venous system from the level of the subclavian vein and including the jugular, axillary, basilic, radial, ulnar and upper cephalic vein. Spectral Doppler was utilized to evaluate flow at rest and with distal augmentation maneuvers. COMPARISON:  None. FINDINGS: Contralateral Subclavian Vein: Respiratory phasicity is normal and symmetric with the symptomatic side. No evidence of thrombus. Normal compressibility. Internal Jugular Vein: No evidence of thrombus. Normal compressibility, respiratory phasicity and response to augmentation. Subclavian Vein: No evidence of thrombus. Normal compressibility, respiratory phasicity and response to augmentation. Axillary Vein: No evidence of thrombus. Normal compressibility, respiratory phasicity and response to augmentation. Cephalic Vein: No evidence of thrombus. Normal compressibility, respiratory phasicity and response to augmentation. Basilic Vein: No evidence of thrombus. Normal compressibility, respiratory phasicity and response to augmentation. Brachial Veins: No evidence of thrombus. Normal compressibility, respiratory phasicity and response to augmentation. Radial Veins: No evidence of thrombus. Normal compressibility, respiratory  phasicity and response to augmentation. Ulnar Veins: No evidence of thrombus. Normal compressibility, respiratory phasicity and response to augmentation. Venous Reflux:  None visualized. Other Findings: Small fluid collections in the area of bruising in the mid upper arm, likely hematoma. IMPRESSION: No evidence of DVT within the right upper extremity. Probable hematoma of within the mid upper arm in the area of bruising. Electronically Signed   By: Rolm Baptise M.D.   On: 11/29/2016 13:48    ____________________________________________   PROCEDURES Procedures  ____________________________________________   INITIAL IMPRESSION / ASSESSMENT AND PLAN / ED COURSE  Pertinent labs & imaging results that were available during my care of the patient were reviewed by me and considered in my medical decision making (see chart for details).  Patient well appearing no acute distress, presents for routine follow-up of prior injuries without acute complaints. She feels like her symptoms overall are improving and it seems like this is consistent with routine healing as expected. Ultrasounds are negative for DVT. I encouraged her to continue following up with her primary care doctor and orthopedics and follow their recommendations. Return precautions given.         ____________________________________________   FINAL CLINICAL IMPRESSION(S) / ED DIAGNOSES  Final diagnoses:  Arm swelling  Peripheral edema      New Prescriptions   No medications on file  Portions of this note were generated with dragon dictation software. Dictation errors may occur despite best attempts at proofreading.    Carrie Mew, MD 11/29/16 1520

## 2016-11-29 NOTE — ED Triage Notes (Signed)
Pt fell 4/27 and had hairline fracture per family.  Has seen ortho but starting having swelling to right arm and leg over last 2 days. Denies SHOB and CP. Sent from [facility] walk in for r/o DVT.  Ok to order [place] per dr Jacqualine Code

## 2016-12-17 DIAGNOSIS — E7800 Pure hypercholesterolemia, unspecified: Secondary | ICD-10-CM | POA: Insufficient documentation

## 2016-12-17 DIAGNOSIS — J4540 Moderate persistent asthma, uncomplicated: Secondary | ICD-10-CM | POA: Insufficient documentation

## 2017-07-11 ENCOUNTER — Other Ambulatory Visit: Payer: Self-pay | Admitting: Physician Assistant

## 2017-07-11 ENCOUNTER — Ambulatory Visit
Admission: RE | Admit: 2017-07-11 | Discharge: 2017-07-11 | Disposition: A | Discharge status: Home / Self Care | Payer: Medicare Other | Source: Ambulatory Visit | Attending: Physician Assistant | Admitting: Physician Assistant

## 2017-07-11 DIAGNOSIS — R220 Localized swelling, mass and lump, head: Secondary | ICD-10-CM | POA: Diagnosis not present

## 2017-07-11 DIAGNOSIS — T148XXA Other injury of unspecified body region, initial encounter: Secondary | ICD-10-CM

## 2017-07-11 DIAGNOSIS — W19XXXA Unspecified fall, initial encounter: Secondary | ICD-10-CM | POA: Diagnosis not present

## 2017-07-11 DIAGNOSIS — R4182 Altered mental status, unspecified: Secondary | ICD-10-CM | POA: Diagnosis present

## 2018-01-29 DIAGNOSIS — F0390 Unspecified dementia without behavioral disturbance: Secondary | ICD-10-CM | POA: Insufficient documentation

## 2018-02-20 DIAGNOSIS — D497 Neoplasm of unspecified behavior of endocrine glands and other parts of nervous system: Secondary | ICD-10-CM | POA: Insufficient documentation

## 2018-02-24 ENCOUNTER — Other Ambulatory Visit: Payer: Self-pay | Admitting: Internal Medicine

## 2018-02-24 DIAGNOSIS — D497 Neoplasm of unspecified behavior of endocrine glands and other parts of nervous system: Secondary | ICD-10-CM

## 2018-03-30 ENCOUNTER — Ambulatory Visit
Admission: RE | Admit: 2018-03-30 | Discharge: 2018-03-30 | Disposition: A | Discharge status: Home / Self Care | Payer: Medicare Other | Source: Ambulatory Visit | Attending: Internal Medicine | Admitting: Internal Medicine

## 2018-03-30 DIAGNOSIS — I6782 Cerebral ischemia: Secondary | ICD-10-CM | POA: Diagnosis not present

## 2018-03-30 DIAGNOSIS — D497 Neoplasm of unspecified behavior of endocrine glands and other parts of nervous system: Secondary | ICD-10-CM | POA: Diagnosis present

## 2018-03-30 DIAGNOSIS — D352 Benign neoplasm of pituitary gland: Secondary | ICD-10-CM | POA: Diagnosis not present

## 2018-03-30 DIAGNOSIS — G319 Degenerative disease of nervous system, unspecified: Secondary | ICD-10-CM | POA: Diagnosis not present

## 2018-03-30 MED ORDER — GADOBENATE DIMEGLUMINE 529 MG/ML IV SOLN
10.0000 mL | Freq: Once | INTRAVENOUS | Status: AC | PRN
  Administered 2018-03-30: 10 mL via INTRAVENOUS

## 2018-05-13 ENCOUNTER — Emergency Department: Payer: Medicare Other

## 2018-05-13 ENCOUNTER — Other Ambulatory Visit: Payer: Self-pay

## 2018-05-13 ENCOUNTER — Encounter: Payer: Self-pay | Admitting: Emergency Medicine

## 2018-05-13 ENCOUNTER — Inpatient Hospital Stay
Admission: EM | Admit: 2018-05-13 | Discharge: 2018-05-19 | DRG: 683 | Disposition: A | Discharge status: Skilled Nursing Facility | Payer: Medicare Other | Attending: Internal Medicine | Admitting: Internal Medicine

## 2018-05-13 DIAGNOSIS — Z7951 Long term (current) use of inhaled steroids: Secondary | ICD-10-CM

## 2018-05-13 DIAGNOSIS — I2510 Atherosclerotic heart disease of native coronary artery without angina pectoris: Secondary | ICD-10-CM | POA: Diagnosis present

## 2018-05-13 DIAGNOSIS — E1122 Type 2 diabetes mellitus with diabetic chronic kidney disease: Secondary | ICD-10-CM | POA: Diagnosis present

## 2018-05-13 DIAGNOSIS — B958 Unspecified staphylococcus as the cause of diseases classified elsewhere: Secondary | ICD-10-CM | POA: Diagnosis present

## 2018-05-13 DIAGNOSIS — R7881 Bacteremia: Secondary | ICD-10-CM | POA: Diagnosis present

## 2018-05-13 DIAGNOSIS — R262 Difficulty in walking, not elsewhere classified: Secondary | ICD-10-CM

## 2018-05-13 DIAGNOSIS — Z515 Encounter for palliative care: Secondary | ICD-10-CM | POA: Diagnosis not present

## 2018-05-13 DIAGNOSIS — Z79899 Other long term (current) drug therapy: Secondary | ICD-10-CM

## 2018-05-13 DIAGNOSIS — J449 Chronic obstructive pulmonary disease, unspecified: Secondary | ICD-10-CM | POA: Diagnosis present

## 2018-05-13 DIAGNOSIS — F0390 Unspecified dementia without behavioral disturbance: Secondary | ICD-10-CM | POA: Diagnosis present

## 2018-05-13 DIAGNOSIS — R531 Weakness: Secondary | ICD-10-CM | POA: Diagnosis present

## 2018-05-13 DIAGNOSIS — Z888 Allergy status to other drugs, medicaments and biological substances status: Secondary | ICD-10-CM | POA: Diagnosis not present

## 2018-05-13 DIAGNOSIS — Z951 Presence of aortocoronary bypass graft: Secondary | ICD-10-CM

## 2018-05-13 DIAGNOSIS — R4182 Altered mental status, unspecified: Secondary | ICD-10-CM

## 2018-05-13 DIAGNOSIS — I130 Hypertensive heart and chronic kidney disease with heart failure and stage 1 through stage 4 chronic kidney disease, or unspecified chronic kidney disease: Secondary | ICD-10-CM | POA: Diagnosis present

## 2018-05-13 DIAGNOSIS — Z23 Encounter for immunization: Secondary | ICD-10-CM

## 2018-05-13 DIAGNOSIS — R001 Bradycardia, unspecified: Secondary | ICD-10-CM | POA: Diagnosis present

## 2018-05-13 DIAGNOSIS — I5032 Chronic diastolic (congestive) heart failure: Secondary | ICD-10-CM | POA: Diagnosis present

## 2018-05-13 DIAGNOSIS — L89302 Pressure ulcer of unspecified buttock, stage 2: Secondary | ICD-10-CM

## 2018-05-13 DIAGNOSIS — N183 Chronic kidney disease, stage 3 (moderate): Secondary | ICD-10-CM | POA: Diagnosis present

## 2018-05-13 DIAGNOSIS — Z7901 Long term (current) use of anticoagulants: Secondary | ICD-10-CM

## 2018-05-13 DIAGNOSIS — D352 Benign neoplasm of pituitary gland: Secondary | ICD-10-CM | POA: Diagnosis present

## 2018-05-13 DIAGNOSIS — Z79891 Long term (current) use of opiate analgesic: Secondary | ICD-10-CM

## 2018-05-13 DIAGNOSIS — R05 Cough: Secondary | ICD-10-CM

## 2018-05-13 DIAGNOSIS — N179 Acute kidney failure, unspecified: Secondary | ICD-10-CM | POA: Diagnosis present

## 2018-05-13 DIAGNOSIS — Z952 Presence of prosthetic heart valve: Secondary | ICD-10-CM | POA: Diagnosis not present

## 2018-05-13 DIAGNOSIS — L8990 Pressure ulcer of unspecified site, unspecified stage: Secondary | ICD-10-CM

## 2018-05-13 DIAGNOSIS — Z7189 Other specified counseling: Secondary | ICD-10-CM | POA: Diagnosis not present

## 2018-05-13 DIAGNOSIS — R059 Cough, unspecified: Secondary | ICD-10-CM

## 2018-05-13 LAB — COMPREHENSIVE METABOLIC PANEL
ALBUMIN: 3.2 g/dL — AB (ref 3.5–5.0)
ALT: 17 U/L (ref 0–44)
AST: 18 U/L (ref 15–41)
Alkaline Phosphatase: 59 U/L (ref 38–126)
Anion gap: 9 (ref 5–15)
BILIRUBIN TOTAL: 1.1 mg/dL (ref 0.3–1.2)
BUN: 73 mg/dL — AB (ref 8–23)
CO2: 30 mmol/L (ref 22–32)
Calcium: 8.6 mg/dL — ABNORMAL LOW (ref 8.9–10.3)
Chloride: 105 mmol/L (ref 98–111)
Creatinine, Ser: 2.18 mg/dL — ABNORMAL HIGH (ref 0.44–1.00)
GFR calc Af Amer: 23 mL/min — ABNORMAL LOW (ref >60)
GFR calc non Af Amer: 20 mL/min — ABNORMAL LOW (ref >60)
GLUCOSE: 156 mg/dL — AB (ref 70–99)
POTASSIUM: 4.4 mmol/L (ref 3.5–5.1)
Sodium: 144 mmol/L (ref 135–145)
TOTAL PROTEIN: 7.4 g/dL (ref 6.5–8.1)

## 2018-05-13 LAB — CBC WITH DIFFERENTIAL/PLATELET
ABS IMMATURE GRANULOCYTES: 0.04 10*3/uL (ref 0.00–0.07)
BASOS PCT: 0 %
Basophils Absolute: 0 10*3/uL (ref 0.0–0.1)
Eosinophils Absolute: 0.4 10*3/uL (ref 0.0–0.5)
Eosinophils Relative: 4 %
HCT: 30.6 % — ABNORMAL LOW (ref 36.0–46.0)
Hemoglobin: 9.6 g/dL — ABNORMAL LOW (ref 12.0–15.0)
IMMATURE GRANULOCYTES: 0 %
Lymphocytes Relative: 11 %
Lymphs Abs: 1.1 10*3/uL (ref 0.7–4.0)
MCH: 29.3 pg (ref 26.0–34.0)
MCHC: 31.4 g/dL (ref 30.0–36.0)
MCV: 93.3 fL (ref 80.0–100.0)
MONOS PCT: 7 %
Monocytes Absolute: 0.8 10*3/uL (ref 0.1–1.0)
Neutro Abs: 8.3 10*3/uL — ABNORMAL HIGH (ref 1.7–7.7)
Neutrophils Relative %: 78 %
PLATELETS: 165 10*3/uL (ref 150–400)
RBC: 3.28 MIL/uL — AB (ref 3.87–5.11)
RDW: 18.5 % — ABNORMAL HIGH (ref 11.5–15.5)
WBC: 10.7 10*3/uL — ABNORMAL HIGH (ref 4.0–10.5)
nRBC: 0 % (ref 0.0–0.2)

## 2018-05-13 LAB — TROPONIN I: Troponin I: <0.03 ng/mL (ref <0.03)

## 2018-05-13 LAB — PROTIME-INR
INR: 2.68
Prothrombin Time: 28.1 seconds — ABNORMAL HIGH (ref 11.4–15.2)

## 2018-05-13 LAB — LACTIC ACID, PLASMA
LACTIC ACID, VENOUS: 1 mmol/L (ref 0.5–1.9)
Lactic Acid, Venous: 1.2 mmol/L (ref 0.5–1.9)

## 2018-05-13 LAB — GLUCOSE, CAPILLARY: GLUCOSE-CAPILLARY: 112 mg/dL — AB (ref 70–99)

## 2018-05-13 LAB — BRAIN NATRIURETIC PEPTIDE: B Natriuretic Peptide: 100 pg/mL (ref 0.0–100.0)

## 2018-05-13 MED ORDER — ACETAMINOPHEN 325 MG PO TABS
650.0000 mg | ORAL_TABLET | Freq: Four times a day (QID) | ORAL | Status: DC | PRN
  Administered 2018-05-17 – 2018-05-18 (×3): 650 mg via ORAL
  Filled 2018-05-13 (×4): qty 2

## 2018-05-13 MED ORDER — SODIUM CHLORIDE 0.45 % IV SOLN
INTRAVENOUS | Status: DC
  Administered 2018-05-13 – 2018-05-14 (×2): via INTRAVENOUS

## 2018-05-13 MED ORDER — TIZANIDINE HCL 2 MG PO TABS
2.0000 mg | ORAL_TABLET | Freq: Every day | ORAL | Status: DC
  Administered 2018-05-14 – 2018-05-18 (×5): 2 mg via ORAL
  Filled 2018-05-13 (×7): qty 1

## 2018-05-13 MED ORDER — GABAPENTIN 600 MG PO TABS
600.0000 mg | ORAL_TABLET | Freq: Two times a day (BID) | ORAL | Status: DC
  Administered 2018-05-13 – 2018-05-14 (×2): 600 mg via ORAL
  Filled 2018-05-13 (×2): qty 1

## 2018-05-13 MED ORDER — ONDANSETRON HCL 4 MG PO TABS: 4.0000 mg | ORAL_TABLET | Freq: Four times a day (QID) | ORAL | Status: DC | PRN

## 2018-05-13 MED ORDER — POLYETHYLENE GLYCOL 3350 17 G PO PACK: 17.0000 g | PACK | Freq: Every day | ORAL | Status: DC | PRN

## 2018-05-13 MED ORDER — SODIUM CHLORIDE 0.9 % IV BOLUS
200.0000 mL | Freq: Once | INTRAVENOUS | Status: AC
  Administered 2018-05-13: 200 mL via INTRAVENOUS

## 2018-05-13 MED ORDER — MOMETASONE FURO-FORMOTEROL FUM 200-5 MCG/ACT IN AERO
2.0000 | INHALATION_SPRAY | Freq: Two times a day (BID) | RESPIRATORY_TRACT | Status: DC
  Administered 2018-05-13 – 2018-05-19 (×12): 2 via RESPIRATORY_TRACT
  Filled 2018-05-13: qty 8.8

## 2018-05-13 MED ORDER — ATORVASTATIN CALCIUM 20 MG PO TABS
80.0000 mg | ORAL_TABLET | Freq: Every day | ORAL | Status: DC
  Administered 2018-05-14 – 2018-05-19 (×6): 80 mg via ORAL
  Filled 2018-05-13 (×6): qty 4

## 2018-05-13 MED ORDER — OXYCODONE HCL ER 10 MG PO T12A
10.0000 mg | EXTENDED_RELEASE_TABLET | Freq: Two times a day (BID) | ORAL | Status: DC
  Administered 2018-05-13 – 2018-05-19 (×12): 10 mg via ORAL
  Filled 2018-05-13 (×12): qty 1

## 2018-05-13 MED ORDER — INFLUENZA VAC SPLIT HIGH-DOSE 0.5 ML IM SUSY
0.5000 mL | PREFILLED_SYRINGE | INTRAMUSCULAR | Status: AC
  Administered 2018-05-15: 0.5 mL via INTRAMUSCULAR
  Filled 2018-05-13 (×2): qty 0.5

## 2018-05-13 MED ORDER — ACETAMINOPHEN 650 MG RE SUPP: 650.0000 mg | Freq: Four times a day (QID) | RECTAL | Status: DC | PRN

## 2018-05-13 MED ORDER — ONDANSETRON HCL 4 MG/2ML IJ SOLN: 4.0000 mg | Freq: Four times a day (QID) | INTRAMUSCULAR | Status: DC | PRN

## 2018-05-13 MED ORDER — INSULIN ASPART 100 UNIT/ML ~~LOC~~ SOLN: 0.0000 [IU] | Freq: Every day | SUBCUTANEOUS | Status: DC

## 2018-05-13 MED ORDER — WARFARIN SODIUM 2.5 MG PO TABS: 2.5000 mg | ORAL_TABLET | Freq: Once | ORAL | Status: DC

## 2018-05-13 MED ORDER — POTASSIUM CHLORIDE CRYS ER 20 MEQ PO TBCR
20.0000 meq | EXTENDED_RELEASE_TABLET | Freq: Every day | ORAL | Status: DC
  Administered 2018-05-14 – 2018-05-19 (×6): 20 meq via ORAL
  Filled 2018-05-13 (×6): qty 1

## 2018-05-13 MED ORDER — DONEPEZIL HCL 5 MG PO TABS
5.0000 mg | ORAL_TABLET | Freq: Every day | ORAL | Status: DC
  Administered 2018-05-13 – 2018-05-18 (×6): 5 mg via ORAL
  Filled 2018-05-13 (×7): qty 1

## 2018-05-13 MED ORDER — INSULIN ASPART 100 UNIT/ML ~~LOC~~ SOLN
0.0000 [IU] | Freq: Three times a day (TID) | SUBCUTANEOUS | Status: DC
  Administered 2018-05-14: 1 [IU] via SUBCUTANEOUS
  Administered 2018-05-14: 2 [IU] via SUBCUTANEOUS
  Administered 2018-05-15: 3 [IU] via SUBCUTANEOUS
  Administered 2018-05-15: 1 [IU] via SUBCUTANEOUS
  Administered 2018-05-16: 2 [IU] via SUBCUTANEOUS
  Administered 2018-05-16: 1 [IU] via SUBCUTANEOUS
  Administered 2018-05-16 – 2018-05-17 (×2): 2 [IU] via SUBCUTANEOUS
  Administered 2018-05-17 – 2018-05-18 (×2): 1 [IU] via SUBCUTANEOUS
  Filled 2018-05-13 (×10): qty 1

## 2018-05-13 MED ORDER — ALBUTEROL SULFATE (2.5 MG/3ML) 0.083% IN NEBU: 2.5000 mg | INHALATION_SOLUTION | RESPIRATORY_TRACT | Status: DC | PRN

## 2018-05-13 NOTE — ED Notes (Signed)
MD Malinda notified that pts BP is 98/42 after 200 mL of NS

## 2018-05-13 NOTE — Progress Notes (Signed)
ANTICOAGULATION CONSULT NOTE - Initial Consult  Pharmacy Consult for warfarin Indication: atrial fibrillation  Allergies  Allergen Reactions  . Pentazocine Lactate [Pentazocine]     Patient Measurements: Weight: 107 lb 6.4 oz (48.7 kg)  Vital Signs: Temp: 97.4 F (36.3 C) (10/23 2218) Temp Source: Oral (10/23 2218) BP: 136/87 (10/23 2218) Pulse Rate: 60 (10/23 2218)  Labs: Recent Labs    05/13/18 1816 05/13/18 2257  HGB 9.6*  --   HCT 30.6*  --   PLT 165  --   LABPROT  --  28.1*  INR  --  2.68  CREATININE 2.18*  --   TROPONINI <0.03  --     CrCl cannot be calculated (Unknown ideal weight.).   Medical History: Past Medical History:  Diagnosis Date  . CHF (congestive heart failure) ([place])   . COPD (chronic obstructive pulmonary disease) ([place])   . Coronary artery disease   . Diabetes mellitus without complication ([place])   . Hypertension     Medications:  Scheduled:  . [START ON 05/14/2018] atorvastatin  80 mg Oral Daily  . donepezil  5 mg Oral QHS  . gabapentin  600 mg Oral BID  . [START ON 05/14/2018] Influenza vac split quadrivalent PF  0.5 mL Intramuscular Tomorrow-1000  . insulin aspart  0-5 Units Subcutaneous QHS  . [START ON 05/14/2018] insulin aspart  0-9 Units Subcutaneous TID WC  . mometasone-formoterol  2 puff Inhalation BID  . oxyCODONE  10 mg Oral Q12H  . [START ON 05/14/2018] potassium chloride SA  20 mEq Oral Daily  . tiZANidine  2 mg Oral QHS  . [START ON 05/14/2018] warfarin  2.5 mg Oral ONCE-1800    Assessment: Patient admitted for leg pain w/ h/o dementia and mechanical aortic valve replacement being anticoagulated w/ warfarin PTA. Warfarin PTA regimen: Warfarin 5 mg Tues, Wed, Thurs, Sat, Sun Warfarin 2.5 mg Mon, Fri  10/23 INR 2.68 currently therapeutic. Goal range for mechanical aortic (2 - 3)  Goal of Therapy:  INR 2-3 Monitor platelets by anticoagulation protocol: Yes   Plan:  Will give patient warfarin 2.5 mg PO x 1 Will  monitor daily INR's and adjust dose based on INR trend. Will monitor daily CBC's and monitor for s/sx of bleeding.  Tobie Lords, PharmD, BCPS Clinical Pharmacist 05/13/2018

## 2018-05-13 NOTE — Progress Notes (Signed)
Advance care planning  Purpose of Encounter CHF, acute kidney injury, dementia  Parties in Attendance Patient's [name] and daughter-in-law  Patients Decisional capacity Patient with dementia and unable to make medical decisions.  Has documented healthcare power of attorney her son.  [name].  Discussed with son regarding patient's congestive heart failure and lower committee edema but at this point needing admission for acute kidney injury and stopping Lasix and starting on IV fluids.  We also discussed about chronic lower committee edema not improving and will try and manage symptoms.  I explained the dementia as it worsens would also cause problems with appetite and nutrition.  Discussed regarding CODE STATUS and son wants patient to be a full code with aggressive treatment.  Orders entered  Time spent - 17 minutes

## 2018-05-13 NOTE — H&P (Addendum)
[facility] [place] NAME: Rhonda Saunders    MR#:  [id number]  DATE OF BIRTH:  02/13/1932  DATE OF ADMISSION:  05/13/2018  PRIMARY CARE PHYSICIAN: Katheren Shams   REQUESTING/REFERRING PHYSICIAN: Dr. Cinda Quest  CHIEF COMPLAINT:   Chief Complaint  Patient presents with  . Leg Pain    HISTORY OF PRESENT ILLNESS:  Rhonda Saunders  is a 84 y.o. female with a known history of dementia, mechanical aortic valve on Coumadin, diastolic CHF chronic lower extremity edema, hypertension, diabetes mellitus, CKD stage III presents to the emergency room due to weakness, poor oral intake.  She was recently put on increased dose of Lasix due to worsening lower committee swelling.  Her swelling has improved some but patient has grown weaker.  Poor oral intake.  Today in the emergency room she was found to have acute kidney injury with creatinine at 2 with baseline being 1.4-1.  No signs of infection.  PAST MEDICAL HISTORY:   Past Medical History:  Diagnosis Date  . CHF (congestive heart failure) ([place])   . COPD (chronic obstructive pulmonary disease) ([place])   . Coronary artery disease   . Diabetes mellitus without complication ([place])   . Hypertension     PAST SURGICAL HISTORY:   Past Surgical History:  Procedure Laterality Date  . AORTIC VALVE REPLACEMENT (AVR)/CORONARY ARTERY BYPASS GRAFTING (CABG)    . TONSILLECTOMY      SOCIAL HISTORY:   Social History   Tobacco Use  . Smoking status: Never Smoker  . Smokeless tobacco: Never Used  Substance Use Topics  . Alcohol use: No    FAMILY HISTORY:  History reviewed. No pertinent family history.  DRUG ALLERGIES:   Allergies  Allergen Reactions  . Pentazocine Lactate [Pentazocine]     REVIEW OF SYSTEMS:   Review of Systems  Unable to perform ROS: Dementia    MEDICATIONS AT HOME:   Prior to Admission medications   Medication Sig Start Date End Date Taking? Authorizing Provider  albuterol  (PROVENTIL HFA;VENTOLIN HFA) 108 (90 Base) MCG/ACT inhaler Inhale 2 puffs into the lungs every 6 (six) hours as needed. 01/02/18 07/01/18 Yes [provider]  atorvastatin (LIPITOR) 80 MG tablet Take 1 tablet by mouth daily. 09/11/16  Yes [provider]  donepezil (ARICEPT) 5 MG tablet Take 5 mg by mouth at bedtime. 03/31/18  Yes [provider]  Fluticasone-Salmeterol (ADVAIR DISKUS) 250-50 MCG/DOSE AEPB INHALE 1 PUFF INTO THE LUNGS ONCE DAILY AS DIRECTED 11/13/15  Yes [provider]  furosemide (LASIX) 40 MG tablet TAKE 2 TABLETS BY MOUTH EVERY MORNING AND TAKE 1 TABLET BY MOUTH EVERY EVENING 11/05/16  Yes [provider]  gabapentin (NEURONTIN) 600 MG tablet Take 600 mg by mouth 2 (two) times daily.  10/06/15  Yes [provider]  metoprolol succinate (TOPROL XL) 50 MG 24 hr tablet TAKE 1 TABLET BY MOUTH EVERY DAY. 06/03/16  Yes [provider]  oxyCODONE (OXYCONTIN) 10 mg 12 hr tablet Take 10 mg by mouth every 12 (twelve) hours.    Yes [provider]  potassium chloride SA (K-DUR,KLOR-CON) 20 MEQ tablet TAKE 1 TABLET BY MOUTH TWICE A DAY. 03/11/16  Yes [provider]  ramipril (ALTACE) 10 MG capsule TAKE 1 CAPSULE BY MOUTH EVERY DAY. 09/10/16  Yes [provider]  tiZANidine (ZANAFLEX) 2 MG tablet Take 2 mg by mouth at bedtime. 01/20/18  Yes [provider]  warfarin (COUMADIN) 5 MG tablet Take 2.5-5  mg by mouth.  10/10/16  Yes [provider]     VITAL SIGNS:  Blood pressure (!) 117/47, pulse (!) 53, temperature (!) 97.3 F (36.3 C), temperature source Oral, resp. rate 13, weight 48.7 kg, SpO2 94 %.  PHYSICAL EXAMINATION:  Physical Exam  GENERAL:  84 y.o.-year-old patient lying in the bed with no acute distress.  EYES: Pupils equal, round, reactive to light and accommodation. No scleral icterus. Extraocular muscles intact.  HEENT: Head atraumatic, normocephalic. Oropharynx and nasopharynx  clear. No oropharyngeal erythema, moist oral mucosa  NECK:  Supple, no jugular venous distention. No thyroid enlargement, no tenderness.  LUNGS: Normal breath sounds bilaterally, no wheezing, rales, rhonchi. No use of accessory muscles of respiration.  CARDIOVASCULAR: S1, S2 normal. No murmurs, rubs, or gallops.  ABDOMEN: Soft, nontender, nondistended. Bowel sounds present. No organomegaly or mass.  EXTREMITIES: No cyanosis, or clubbing. + 2 pedal & radial pulses b/l.  Bilateral lower extremity edema  EUROLOGIC: Cranial nerves II through XII are intact. No focal Motor or sensory deficits appreciated b/l PSYCHIATRIC: The patient is alert and awake. SKIN: Chronic lower committee changes from edema  LABORATORY PANEL:   CBC Recent Labs  Lab 05/13/18 1816  WBC 10.7*  HGB 9.6*  HCT 30.6*  PLT 165   ------------------------------------------------------------------------------------------------------------------  Chemistries  Recent Labs  Lab 05/13/18 1816  NA 144  K 4.4  CL 105  CO2 30  GLUCOSE 156*  BUN 73*  CREATININE 2.18*  CALCIUM 8.6*  AST 18  ALT 17  ALKPHOS 59  BILITOT 1.1   ------------------------------------------------------------------------------------------------------------------  Cardiac Enzymes Recent Labs  Lab 05/13/18 1816  TROPONINI <0.03   ------------------------------------------------------------------------------------------------------------------  RADIOLOGY:  Ct Head Wo Contrast  Result Date: 05/13/2018 CLINICAL DATA:  Altered level of consciousness, leg pain and swelling for 2 weeks EXAM: CT HEAD WITHOUT CONTRAST TECHNIQUE: Contiguous axial images were obtained from the base of the skull through the vertex without intravenous contrast. COMPARISON:  05/11/2017 FINDINGS: Brain: Similar brain atrophy pattern and chronic white matter microvascular changes throughout both cerebral hemispheres. No acute hemorrhage, definite new infarction, mass  lesion, midline shift, herniation, hydrocephalus or extra-axial fluid collection. No focal mass effect or edema. Cisterns are patent. Cerebellar atrophy as well. 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 Vascular: Intracranial atherosclerosis.  No hyperdense vessel. Skull: No acute osseous finding or fracture. Sinuses/Orbits: No acute finding. Other: None. IMPRESSION: Stable atrophy and chronic white matter microvascular changes No acute intracranial abnormality by noncontrast CT. 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 Electronically Signed   By: Jerilynn Mages.  Shick M.D.   On: 05/13/2018 19:47   Dg Chest Portable 1 View  Result Date: 05/13/2018 CLINICAL DATA:  Altered mental status EXAM: PORTABLE CHEST 1 VIEW COMPARISON:  None. FINDINGS: Prior CABG. Cardiomegaly. No confluent opacities, effusions or edema. No acute bony abnormality. IMPRESSION: Cardiomegaly.  No active disease. Electronically Signed   By: Rolm Baptise M.D.   On: 05/13/2018 19:35     IMPRESSION AND PLAN:   *Acute kidney injury over CKD stage III secondary to poor oral intake and being on Lasix.  At this point hold Lasix.  Start gentle IV hydration with half-normal saline at 50 mL/h.  Monitor input and output.  Repeat labs in the morning.  Hold ACE inhibitor.  *Hypertension. Blood pressure in low normal range.  Hold metoprolol and ramipril  *Sinus bradycardia in the 50s.  Hold metoprolol  *Chronic diastolic CHF with chronic lower committee edema.  At this point  due to worsening creatinine hold Lasix and start IV fluids.  No signs of pulmonary edema or shortness of breath.  *Mechanical aortic valve with anticoagulation with Coumadin.  Pharmacy to dose.  *Dementia.  Monitor for inpatient delirium.  *Pituitary adenoma Patient had MRI of the brain September 2019.  Per family this is being monitored as outpatient  All the records are reviewed and case discussed with ED  provider. Management plans discussed with the patient, family and they are in agreement.  CODE STATUS: FULL CODE  TOTAL TIME TAKING CARE OF THIS PATIENT: 40 minutes.   Rhonda Saunders M.D on 05/13/2018 at 8:52 PM  Between 7am to 6pm - Pager - [phone]  After 6pm go to [email or web address] - password [email or web address] [facility]  Office  [phone]  CC: Primary care physician; Katheren Shams  Note: This dictation was prepared with Dragon dictation along with smaller phrase technology. Any transcriptional errors that result from this process are unintentional.

## 2018-05-13 NOTE — ED Notes (Signed)
MD Malinda notified that pts BP is 84/60

## 2018-05-13 NOTE — ED Notes (Signed)
Healing wound to left lower leg with small amount of clear drainage

## 2018-05-13 NOTE — ED Provider Notes (Addendum)
[facility] Emergency Department Provider Note   ____________________________________________   First MD Initiated Contact with Patient 05/13/18 1805     (approximate)  I have reviewed the triage vital signs and the nursing notes.   HISTORY  Chief Complaint Leg Pain   HPI GOWRI SUCHAN is a 84 y.o. female patient says she is been feeling tired and her legs been swelling.  She seems a little bit slow in her responses.  She says nothing else is really bothering her much.  Family comes in slightly later and says it for the last several days she is been very slow in her responses having some trouble walking.  Try to get her to the doctor for follow-up appointment but were unable to do so because she ate with had a lot of trouble walking down the stairs and then be could not get into the car.  After that she is been sleeping most of the time.  She had seen primary care initially for the leg swelling and some blisters that began draining.  They have been dressed.  Family reports she had a pituitary tumor.  Repeat view of the MRI done early last month shows that there is a pituitary macroadenoma lifting the optic chiasm and invading the venous sinuses.  We will repeat CT of the head to check and see what is going on with that now.  Family also reports she has a ulcer between her buttocks cheeks.  Her BUN and creatinine are significantly elevated compared to the last one available which is from last year.  His blood pressure is low.  We will give her small fluid boluses and see if we can get it up above 100.   Past Medical History:  Diagnosis Date  . CHF (congestive heart failure) ([place])   . COPD (chronic obstructive pulmonary disease) ([place])   . Coronary artery disease   . Diabetes mellitus without complication ([place])   . Hypertension     There are no active problems to display for this patient.   Past Surgical History:  Procedure Laterality Date  . AORTIC VALVE  REPLACEMENT (AVR)/CORONARY ARTERY BYPASS GRAFTING (CABG)    . TONSILLECTOMY      Prior to Admission medications   Medication Sig Start Date End Date Taking? Authorizing Provider  albuterol (PROVENTIL HFA;VENTOLIN HFA) 108 (90 Base) MCG/ACT inhaler Inhale 2 puffs into the lungs every 6 (six) hours as needed. 01/02/18 07/01/18 Yes [provider]  atorvastatin (LIPITOR) 80 MG tablet Take 1 tablet by mouth daily. 09/11/16  Yes [provider]  donepezil (ARICEPT) 5 MG tablet Take 5 mg by mouth at bedtime. 03/31/18  Yes [provider]  Fluticasone-Salmeterol (ADVAIR DISKUS) 250-50 MCG/DOSE AEPB INHALE 1 PUFF INTO THE LUNGS ONCE DAILY AS DIRECTED 11/13/15  Yes [provider]  furosemide (LASIX) 40 MG tablet TAKE 2 TABLETS BY MOUTH EVERY MORNING AND TAKE 1 TABLET BY MOUTH EVERY EVENING 11/05/16  Yes [provider]  gabapentin (NEURONTIN) 600 MG tablet Take 600 mg by mouth 2 (two) times daily.  10/06/15  Yes [provider]  metoprolol succinate (TOPROL XL) 50 MG 24 hr tablet TAKE 1 TABLET BY MOUTH EVERY DAY. 06/03/16  Yes [provider]  oxyCODONE (OXYCONTIN) 10 mg 12 hr tablet Take 10 mg by mouth every 12 (twelve) hours.    Yes [provider]  potassium chloride SA (K-DUR,KLOR-CON) 20 MEQ tablet TAKE 1 TABLET BY MOUTH TWICE A DAY. 03/11/16  Yes  [provider]  ramipril (ALTACE) 10 MG capsule TAKE 1 CAPSULE BY MOUTH EVERY DAY. 09/10/16  Yes [provider]  tiZANidine (ZANAFLEX) 2 MG tablet Take 2 mg by mouth at bedtime. 01/20/18  Yes [provider]  warfarin (COUMADIN) 5 MG tablet Take 2.5-5 mg by mouth.  10/10/16  Yes [provider]    Allergies Pentazocine lactate [pentazocine]  History reviewed. No pertinent family history.  Social History Social History   Tobacco Use  . Smoking status: Never Smoker  . Smokeless tobacco: Never Used  Substance Use Topics  . Alcohol use: No  . Drug  use: Not on file    Review of Systems  Constitutional: No fever/chills ENT: No sore throat.\ Eyes: Patient denies any visual problems Cardiovascular: Denies chest pain. Respiratory: Denies shortness of breath. Gastrointestinal: No abdominal pain.  No nausea, no vomiting.  No diarrhea.  No constipation. Genitourinary: Negative for dysuria. Musculoskeletal: Negative for back pain. Skin: Negative for rash. Neurological: Negative for headaches, focal weakness    ____________________________________________   PHYSICAL EXAM:  VITAL SIGNS: ED Triage Vitals  Enc Vitals Group     BP 05/13/18 1808 (!) 84/60     Pulse Rate 05/13/18 1808 62     Resp 05/13/18 1808 16     Temp 05/13/18 1808 (!) 97.3 F (36.3 C)     Temp Source 05/13/18 1808 Oral     SpO2 05/13/18 1805 99 %     Weight 05/13/18 1812 107 lb 6.4 oz (48.7 kg)     Height --      Head Circumference --      Peak Flow --      Pain Score 05/13/18 1811 0     Pain Loc --      Pain Edu? --      Excl. in [place]? --     Constitutional: Awake but somewhat slow in her responses. Eyes: Conjunctivae are normal. PERRL. EOMI. Head: Atraumatic. Nose: No congestion/rhinnorhea. Mouth/Throat: Mucous membranes are moist.  Oropharynx non-erythematous. Neck: No stridor.   Cardiovascular: Normal rate, regular rhythm. Grossly normal heart sounds.  Good peripheral circulation. Respiratory: Normal respiratory effort.  No retractions. Lungs CTAB. Gastrointestinal: Soft and nontender. No distention. No abdominal bruits. No CVA tenderness. Musculoskeletal: No lower extremity tenderness 1+ bilateral edema.  Left ankle has a dressing on it this is removed there is a very small area that looks like it is a healing blister. Neurologic:  Normal speech and language. No gross focal neurologic deficits are appreciated.  Although he did not try to walk the patient Skin:  Skin is warm, dry and intact. No rash noted. Psychiatric: Mood and affect are normal.   But the patient overall appears to be slightly slower than I would expect  ____________________________________________   LABS (all labs ordered are listed, but only abnormal results are displayed)  Labs Reviewed  COMPREHENSIVE METABOLIC PANEL - Abnormal; Notable for the following components:      Result Value   Glucose, Bld 156 (*)    BUN 73 (*)    Creatinine, Ser 2.18 (*)    Calcium 8.6 (*)    Albumin 3.2 (*)    GFR calc non Af Amer 20 (*)    GFR calc Af Amer 23 (*)    All other components within normal limits  CBC WITH DIFFERENTIAL/PLATELET - Abnormal; Notable for the following components:   WBC 10.7 (*)    RBC 3.28 (*)    Hemoglobin 9.6 (*)  HCT 30.6 (*)    RDW 18.5 (*)    Neutro Abs 8.3 (*)    All other components within normal limits  TROPONIN I  BRAIN NATRIURETIC PEPTIDE  LACTIC ACID, PLASMA  LACTIC ACID, PLASMA  URINALYSIS, COMPLETE (UACMP) WITH MICROSCOPIC   ____________________________________________  EKG  EKG read and interpreted by me shows sinus bradycardia rate of 56 normal axis left bundle branch block no acute ST-T wave changes left bundle branch block is new since 2012 ____________________________________________  RADIOLOGY  ED MD interpretation:    Official radiology report(s): Ct Head Wo Contrast  Result Date: 05/13/2018 CLINICAL DATA:  Altered level of consciousness, leg pain and swelling for 2 weeks EXAM: CT HEAD WITHOUT CONTRAST TECHNIQUE: Contiguous axial images were obtained from the base of the skull through the vertex without intravenous contrast. COMPARISON:  05/11/2017 FINDINGS: Brain: Similar brain atrophy pattern and chronic white matter microvascular changes throughout both cerebral hemispheres. No acute hemorrhage, definite new infarction, mass lesion, midline shift, herniation, hydrocephalus or extra-axial fluid collection. No focal mass effect or edema. Cisterns are patent. Cerebellar atrophy as well. Pituitary heterogeneous density  mass appears stable compatible with known macroadenoma. Vascular: Intracranial atherosclerosis.  No hyperdense vessel. Skull: No acute osseous finding or fracture. Sinuses/Orbits: No acute finding. Other: None. IMPRESSION: Stable atrophy and chronic white matter microvascular changes No acute intracranial abnormality by noncontrast CT. 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 Electronically Signed   By: Jerilynn Mages.  Shick M.D.   On: 05/13/2018 19:47   Dg Chest Portable 1 View  Result Date: 05/13/2018 CLINICAL DATA:  Altered mental status EXAM: PORTABLE CHEST 1 VIEW COMPARISON:  None. FINDINGS: Prior CABG. Cardiomegaly. No confluent opacities, effusions or edema. No acute bony abnormality. IMPRESSION: Cardiomegaly.  No active disease. Electronically Signed   By: Rolm Baptise M.D.   On: 05/13/2018 19:35    ____________________________________________   PROCEDURES  Procedure(s) performed:   Procedures  Critical Care performed:   ____________________________________________   INITIAL IMPRESSION / ASSESSMENT AND PLAN / ED COURSE   CT the patient's head is stable.  There is no hyperdense lesions reported.  Patient is however altered in that she is much more sleepy than usual.  She has acute kidney injury compared to last year.  And she cannot walk at all when we try to get her up. Additionally patient has hypotension which is improved with some fluids although she also has a lot of edema.  And she has what appears to be stage II bedsore which is probably new on her left buttocks with some surrounding bruising.       ____________________________________________   FINAL CLINICAL IMPRESSION(S) / ED DIAGNOSES  Final diagnoses:  AKI (acute kidney injury) ([place])  Altered mental status, unspecified altered mental status type  Unable to walk     ED Discharge Orders    None       Note:  This document was prepared using Dragon voice recognition software and may include  unintentional dictation errors.    Nena Polio, MD 05/13/18 Patrecia Pour    Nena Polio, MD 05/13/18 2004

## 2018-05-13 NOTE — ED Triage Notes (Signed)
Pt arrived via EMS from home c/o leg swelling/pain x 2 weeks. Family states that she has been unable to ambulate.

## 2018-05-14 DIAGNOSIS — Z515 Encounter for palliative care: Secondary | ICD-10-CM

## 2018-05-14 DIAGNOSIS — N179 Acute kidney failure, unspecified: Principal | ICD-10-CM

## 2018-05-14 DIAGNOSIS — Z7189 Other specified counseling: Secondary | ICD-10-CM

## 2018-05-14 DIAGNOSIS — R4182 Altered mental status, unspecified: Secondary | ICD-10-CM

## 2018-05-14 LAB — BASIC METABOLIC PANEL
Anion gap: 7 (ref 5–15)
BUN: 65 mg/dL — ABNORMAL HIGH (ref 8–23)
CALCIUM: 8.7 mg/dL — AB (ref 8.9–10.3)
CO2: 33 mmol/L — ABNORMAL HIGH (ref 22–32)
CREATININE: 1.87 mg/dL — AB (ref 0.44–1.00)
Chloride: 106 mmol/L (ref 98–111)
GFR calc non Af Amer: 24 mL/min — ABNORMAL LOW (ref >60)
GFR, EST AFRICAN AMERICAN: 27 mL/min — AB (ref >60)
Glucose, Bld: 103 mg/dL — ABNORMAL HIGH (ref 70–99)
Potassium: 4 mmol/L (ref 3.5–5.1)
SODIUM: 146 mmol/L — AB (ref 135–145)

## 2018-05-14 LAB — URINALYSIS, COMPLETE (UACMP) WITH MICROSCOPIC
BILIRUBIN URINE: NEGATIVE
GLUCOSE, UA: NEGATIVE mg/dL
HGB URINE DIPSTICK: NEGATIVE
Ketones, ur: NEGATIVE mg/dL
LEUKOCYTES UA: NEGATIVE
NITRITE: NEGATIVE
PROTEIN: NEGATIVE mg/dL
Specific Gravity, Urine: 1.01 (ref 1.005–1.030)
pH: 7 (ref 5.0–8.0)

## 2018-05-14 LAB — GLUCOSE, CAPILLARY
GLUCOSE-CAPILLARY: 86 mg/dL (ref 70–99)
GLUCOSE-CAPILLARY: 176 mg/dL — AB (ref 70–99)
Glucose-Capillary: 157 mg/dL — ABNORMAL HIGH (ref 70–99)
Glucose-Capillary: 148 mg/dL — ABNORMAL HIGH (ref 70–99)

## 2018-05-14 LAB — CBC
HCT: 30.5 % — ABNORMAL LOW (ref 36.0–46.0)
Hemoglobin: 9.3 g/dL — ABNORMAL LOW (ref 12.0–15.0)
MCH: 28.5 pg (ref 26.0–34.0)
MCHC: 30.5 g/dL (ref 30.0–36.0)
MCV: 93.6 fL (ref 80.0–100.0)
NRBC: 0 % (ref 0.0–0.2)
PLATELETS: 155 10*3/uL (ref 150–400)
RBC: 3.26 MIL/uL — ABNORMAL LOW (ref 3.87–5.11)
RDW: 18.2 % — AB (ref 11.5–15.5)
WBC: 8.2 10*3/uL (ref 4.0–10.5)

## 2018-05-14 LAB — HEMOGLOBIN A1C
HEMOGLOBIN A1C: 6.5 % — AB (ref 4.8–5.6)
Mean Plasma Glucose: 139.85 mg/dL

## 2018-05-14 LAB — PROTIME-INR
INR: 2.85
Prothrombin Time: 29.5 seconds — ABNORMAL HIGH (ref 11.4–15.2)

## 2018-05-14 MED ORDER — WARFARIN SODIUM 2.5 MG PO TABS
2.5000 mg | ORAL_TABLET | ORAL | Status: DC
  Administered 2018-05-15 – 2018-05-18 (×2): 2.5 mg via ORAL
  Filled 2018-05-14 (×3): qty 1

## 2018-05-14 MED ORDER — WARFARIN - PHARMACIST DOSING INPATIENT
Freq: Every day | Status: DC
  Administered 2018-05-18: 21:00:00

## 2018-05-14 MED ORDER — SENNOSIDES-DOCUSATE SODIUM 8.6-50 MG PO TABS
2.0000 | ORAL_TABLET | Freq: Every day | ORAL | Status: DC
  Administered 2018-05-14 – 2018-05-18 (×5): 2 via ORAL
  Filled 2018-05-14 (×5): qty 2

## 2018-05-14 MED ORDER — WARFARIN SODIUM 5 MG PO TABS
5.0000 mg | ORAL_TABLET | ORAL | Status: DC
  Administered 2018-05-14 – 2018-05-17 (×3): 5 mg via ORAL
  Filled 2018-05-14 (×3): qty 1

## 2018-05-14 MED ORDER — GABAPENTIN 600 MG PO TABS
300.0000 mg | ORAL_TABLET | Freq: Two times a day (BID) | ORAL | Status: DC
  Administered 2018-05-14 – 2018-05-19 (×10): 300 mg via ORAL
  Filled 2018-05-14 (×10): qty 1

## 2018-05-14 MED ORDER — BISACODYL 5 MG PO TBEC: 5.0000 mg | DELAYED_RELEASE_TABLET | Freq: Every day | ORAL | Status: DC | PRN

## 2018-05-14 MED ORDER — SENNOSIDES-DOCUSATE SODIUM 8.6-50 MG PO TABS: 1.0000 | ORAL_TABLET | Freq: Every evening | ORAL | Status: DC | PRN

## 2018-05-14 NOTE — Consult Note (Signed)
Consultation Note Date: 05/14/2018   Patient Name: Rhonda Saunders  DOB: [birth year]  MRN: [id number]  Age / Sex: 84 y.o., female  PCP: Katheren Shams Referring Physician: Demetrios Loll, MD  Reason for Consultation: Establishing goals of care  HPI/Patient Profile: 84 y.o. female  with past medical history of dementia, mechanical aortic valve on coumadin, diastolic HF, chronic lower extremity edema, HTN, T2DM, COPD, pituitary adenoma, and CKD 3 admitted on 05/13/2018 with weakness and poor oral intake. Patient has become progressively weaker and is no longer able to ambulate. She lives at home with a caregiver at night and family that checks on her throughout the day. In ED, she was found to have AKI with creatinine at 2 (baseline 1-1.4). CXR clear. Lasix has been held d/t rising creatinine. PMT consulted by Dr. Darvin Neighbours d/t declining health and dementia.   Clinical Assessment and Goals of Care: I have reviewed medical records including EPIC notes, labs and imaging, received report from RN, assessed the patient and then met with patient's son, Rhonda Saunders,  to discuss diagnosis prognosis, [place], EOL wishes, disposition and options.  I introduced Palliative Medicine as specialized medical care for people living with serious illness. It focuses on providing relief from the symptoms and stress of a serious illness. The goal is to improve quality of life for both the patient and the family.  We discussed a brief life review of the patient. 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 They tell me she is a strong lady. Patient has 3 children and they are all local; however, two of them are not as involved in her care. Her [name] is HCPOA. Patient lives alone but has caregivers that help her. She ends up being alone about 4 hours of the day.  As far as functional and nutritional status, family tells me she needs extensive assistance for  ambulation.  She only a short distance from her chair the the bathroom and for this requires a walker and other assistance. They tell me she has a decent appetite - eats about half of her meals. Family does report that they have noticed a change in her cognitive status - more confused and sleeping more. We talked about progression of dementia.    We discussed her current illness and what it means in the larger context of her on-going co-morbidities.  Natural disease trajectory and expectations at EOL were discussed. Daughter in law seems to understand how ill patient is; however, her son does not.   I attempted to elicit values and goals of care important to the patient.  He tells me the patient would want aggressive measures in order to prolong her life.   The difference between aggressive medical intervention and comfort care was considered in light of the patient's goals of care. Patient's son is interested in continuing aggressive measures.   Advance directives, concepts specific to code status, artifical feeding and hydration, and rehospitalization were considered and discussed. Patient's son would like to continue FULL code even stating that he would not be opposed to long-term ventilation. We discussed continuing to evaluate these decisions as patient's disease progresses.   Hospice and Palliative Care services outpatient were explained and offered. They agree to OP palliative care.   Family reports concern about infrequent bowel movements. Will schedule senna. Also concerned about pressure ulcer. Will order pressure redistribution chair pad.   Family's ultimate goal is for patient to return home after rehab. We discussed available resources  Questions and concerns were addressed. The family was encouraged to call with questions or concerns.   Primary Decision Maker NEXT OF KIN - patient's son, Rhonda Saunders -says he is HCPOA    SUMMARY OF RECOMMENDATIONS   OP palliative to see at  rehab Information and education given to family about patient's illness and available resources Scheduled senna Ordered pressure redistribution chair pad Please call if further assistance is needed from PMT   Code Status/Advance Care Planning:  Full code   Symptom Management:   Senna scheduled - infrequent BMs  Palliative Prophylaxis:   Aspiration, Bowel Regimen, Delirium Protocol, Frequent Pain Assessment, Oral Care and Turn Reposition  Additional Recommendations (Limitations, Scope, Preferences):  Full Scope Treatment  Prognosis:   Unable to determine  Discharge Planning: [place] for rehab with Palliative care service follow-up      Primary Diagnoses: Present on Admission: . AKI (acute kidney injury) ([place])   I have reviewed the medical record, interviewed the patient and family, and examined the patient. The following aspects are pertinent.  Past Medical History:  Diagnosis Date  . CHF (congestive heart failure) ([place])   . COPD (chronic obstructive pulmonary disease) ([place])   . Coronary artery disease   . Diabetes mellitus without complication ([place])   . Hypertension    Social History   Socioeconomic History  . Marital status: Widowed    Spouse name: Not on file  . Number of children: Not on file  . Years of education: Not on file  . Highest education level: Not on file  Occupational History  . Not on file  Social Needs  . Financial resource strain: Not on file  . Food insecurity:    Worry: Not on file    Inability: Not on file  . Transportation needs:    Medical: Not on file    Non-medical: Not on file  Tobacco Use  . Smoking status: Never Smoker  . Smokeless tobacco: Never Used  Substance and Sexual Activity  . Alcohol use: No  . Drug use: Not on file  . Sexual activity: Not on file  Lifestyle  . Physical activity:    Days per week: Not on file    Minutes per session: Not on file  . Stress: Not on file  Relationships  .  Social connections:    Talks on phone: Not on file    Gets together: Not on file    Attends religious service: Not on file    Active member of club or organization: Not on file    Attends meetings of clubs or organizations: Not on file    Relationship status: Not on file  Other Topics Concern  . Not on file  Social History Narrative  . Not on file   History reviewed. No pertinent family history. Scheduled Meds: . atorvastatin  80 mg Oral Daily  . donepezil  5 mg Oral QHS  . gabapentin  600 mg Oral BID  . Influenza vac split quadrivalent PF  0.5 mL Intramuscular Tomorrow-1000  . insulin aspart  0-5 Units Subcutaneous QHS  . insulin aspart  0-9 Units Subcutaneous TID WC  . mometasone-formoterol  2 puff Inhalation BID  . oxyCODONE  10 mg Oral Q12H  . potassium chloride SA  20 mEq Oral Daily  . tiZANidine  2 mg Oral QHS  . [START ON 05/15/2018] warfarin  2.5 mg Oral Once per day on Mon Fri  . warfarin  5 mg Oral Once per day on  Nancy Fetter Tue Wed Thu Sat  . Warfarin - Pharmacist Dosing Inpatient   Does not apply q1800   Continuous Infusions: . sodium chloride 50 mL/hr at 05/14/18 0300   PRN Meds:.acetaminophen **OR** acetaminophen, albuterol, ondansetron **OR** ondansetron (ZOFRAN) IV, polyethylene glycol Allergies  Allergen Reactions  . Pentazocine Lactate [Pentazocine]    Review of Systems  Unable to perform ROS: Dementia    Physical Exam  Constitutional: She appears lethargic. No distress.  HENT:  Head: Normocephalic and atraumatic.  Cardiovascular: Normal rate and regular rhythm.  Pulmonary/Chest: Effort normal and breath sounds normal. No respiratory distress.  Abdominal: Soft.  Musculoskeletal: She exhibits edema.  Neurological: She appears lethargic. She is disoriented.  Skin: Skin is warm and dry. She is not diaphoretic.  Psychiatric: Cognition and memory are impaired.    Vital Signs: BP (!) 150/62 (BP Location: Left Arm)   Pulse (!) 52   Temp (!) 97.5 F (36.4 C)  (Oral)   Resp 19   Ht 5' 2.01" (1.575 m)   Wt 48.7 kg   SpO2 98%   BMI 19.64 kg/m  Pain Scale: 0-10   Pain Score: 0-No pain   SpO2: SpO2: 98 % O2 Device:SpO2: 98 % O2 Flow Rate: .   IO: Intake/output summary:   Intake/Output Summary (Last 24 hours) at 05/14/2018 1051 Last data filed at 05/14/2018 1041 Gross per 24 hour  Intake 654.31 ml  Output 2350 ml  Net -1695.69 ml    LBM: Last BM Date: 05/11/18 Baseline Weight: Weight: 48.7 kg Most recent weight: Weight: 48.7 kg     Palliative Assessment/Data: PPS 40%    Time Total: 70 minutes Greater than 50%  of this time was spent counseling and coordinating care related to the above assessment and plan.  Juel Burrow, DNP, AGNP-C Palliative Medicine Team [phone] Pager: [phone]

## 2018-05-14 NOTE — Evaluation (Signed)
Physical Therapy Evaluation Patient Details Name: Rhonda Saunders MRN: [id number] DOB: [birth year] Today's Date: 05/14/2018   History of Present Illness  presented to ER secondary to progressive weakness, decreased PO intake; admitted for management of AKI.  Clinical Impression  Upon evaluation, patient alert and oriented to self only; follows simple commands with increased time for processing.  Generally weak and deconditioned throughout all extremities; significant ROM limitations to bilat hips/knees due to pain.  Currently requiring max assist for bed mobility; mod/max assist +2 for sit/stand, basic transfers and gait (5') with RW.  Generally unsteady; unable to attempt without RW and +2 at this time. Unable to demonstrate mobility at at level safe to manage in home environment at this time. Would benefit from skilled PT to address above deficits and promote optimal return to PLOF; recommend transition to STR upon discharge from acute hospitalization.     Follow Up Recommendations SNF    Equipment Recommendations       Recommendations for Other Services       Precautions / Restrictions Precautions Precautions: Fall Restrictions Weight Bearing Restrictions: No      Mobility  Bed Mobility Overal bed mobility: Needs Assistance Bed Mobility: Supine to Sit     Supine to sit: Max assist        Transfers Overall transfer level: Needs assistance Equipment used: Rolling walker (2 wheeled) Transfers: Sit to/from Stand Sit to Stand: Mod assist;Max assist;+2 physical assistance         General transfer comment: assist for UE/LE placement, lift off, standing balance  Ambulation/Gait Ambulation/Gait assistance: Mod assist;+2 physical assistance Gait Distance (Feet): 5 Feet Assistive device: Rolling walker (2 wheeled)       General Gait Details: broad BOS, flat foot contact; poor balance reactions  Stairs            Wheelchair Mobility    Modified Rankin (Stroke  Patients Only)       Balance Overall balance assessment: Needs assistance Sitting-balance support: No upper extremity supported;Feet supported Sitting balance-Leahy Scale: Good     Standing balance support: Bilateral upper extremity supported Standing balance-Leahy Scale: Poor                               Pertinent Vitals/Pain Pain Assessment: Faces Faces Pain Scale: Hurts even more Pain Location: bilat knees, R > L Pain Descriptors / Indicators: Aching;Grimacing;Guarding Pain Intervention(s): Limited activity within patient's tolerance;Monitored during session;Repositioned    Home Living Family/patient expects to be discharged to:: Private residence Living Arrangements: Alone Available Help at Discharge: Family;Personal care attendant Family provides intermittent check in during day; caregiver present overnight.  Patient alone for periods of time during the day. Type of Home: House Home Access: Stairs to enter   [employer] of Steps: 12-14 from entrance to upper level (where bed/bathroom are located) Home Layout: Two level Home Equipment: Walker - 2 wheels;Bedside commode Additional Comments: Must negotiate full flight of steps for entry to home, access to bed/bath; once up main living level, does not leave that level except for MD appointments    Prior Function Level of Independence: Needs assistance         Comments: Limited household ambulation with RW, +1 assist (bed to/from bathroom only); assist from family/caregiver for ADLs as needed.       Hand Dominance        Extremity/Trunk Assessment   Upper Extremity Assessment Upper Extremity Assessment: Generalized weakness  Lower Extremity Assessment Lower Extremity Assessment: Generalized weakness(grossly 3-/5, limited by pain)       Communication   Communication: No difficulties  Cognition Arousal/Alertness: Awake/alert Behavior During Therapy: WFL for tasks  assessed/performed Overall Cognitive Status: History of cognitive impairments - at baseline                                        General Comments      Exercises     Assessment/Plan    PT Assessment Patient needs continued PT services  PT Problem List Decreased strength;Decreased range of motion;Decreased activity tolerance;Decreased balance;Decreased mobility;Decreased coordination;Decreased cognition;Decreased knowledge of use of DME;Decreased safety awareness;Decreased knowledge of precautions;Pain       PT Treatment Interventions DME instruction;Gait training;Stair training;Functional mobility training;Therapeutic activities;Therapeutic exercise;Balance training;Patient/family education    PT Goals (Current goals can be found in the Care Plan section)  Acute Rehab PT Goals Patient Stated Goal: per son, to get stronger PT Goal Formulation: With patient/family Time For Goal Achievement: 05/28/18 Potential to Achieve Goals: Fair    Frequency Min 2X/week   Barriers to discharge        Co-evaluation               AM-PAC PT "6 Clicks" Daily Activity  Outcome Measure Difficulty turning over in bed (including adjusting bedclothes, sheets and blankets)?: Unable Difficulty moving from lying on back to sitting on the side of the bed? : Unable Difficulty sitting down on and standing up from a chair with arms (e.g., wheelchair, bedside commode, etc,.)?: Unable Help needed moving to and from a bed to chair (including a wheelchair)?: A Lot Help needed walking in hospital room?: A Lot Help needed climbing 3-5 steps with a railing? : Total 6 Click Score: 8    End of Session Equipment Utilized During Treatment: Gait belt Activity Tolerance: Patient limited by pain;Patient tolerated treatment well Patient left: in chair;with call bell/phone within reach;with chair alarm set;with family/visitor present Nurse Communication: Mobility status PT Visit Diagnosis:  Unsteadiness on feet (R26.81);Muscle weakness (generalized) (M62.81);Difficulty in walking, not elsewhere classified (R26.2);Pain Pain - Right/Left: Right Pain - part of body: Knee    Time: 2336-1224 PT Time Calculation (min) (ACUTE ONLY): 35 min   Charges:   PT Evaluation $PT Eval Moderate Complexity: 1 Mod PT Treatments $Therapeutic Activity: 8-22 mins        Aseneth Hack H. Owens Shark, PT, DPT, NCS 05/14/18, 1:55 PM [phone]

## 2018-05-14 NOTE — Progress Notes (Addendum)
ANTICOAGULATION CONSULT NOTE - Initial Consult  Pharmacy Consult for warfarin Indication: aortic valve replacement  Allergies  Allergen Reactions  . Pentazocine Lactate [Pentazocine]     Patient Measurements: Height: 5' 2.01" (157.5 cm) Weight: 107 lb 6.4 oz (48.7 kg) IBW/kg (Calculated) : 50.12  Vital Signs: Temp: 97.5 F (36.4 C) (10/24 0606) Temp Source: Oral (10/24 0606) BP: 150/62 (10/24 0606) Pulse Rate: 52 (10/24 0606)  Labs: Recent Labs    05/13/18 1816 05/13/18 2257 05/14/18 0535  HGB 9.6*  --  9.3*  HCT 30.6*  --  30.5*  PLT 165  --  155  LABPROT  --  28.1* 29.5*  INR  --  2.68 2.85  CREATININE 2.18*  --  1.87*  TROPONINI <0.03  --   --     Estimated Creatinine Clearance: 17.2 mL/min (A) (by C-G formula based on SCr of 1.87 mg/dL (H)).   Medical History: Past Medical History:  Diagnosis Date  . CHF (congestive heart failure) ([place])   . COPD (chronic obstructive pulmonary disease) ([place])   . Coronary artery disease   . Diabetes mellitus without complication ([place])   . Hypertension     Medications:  Scheduled:  . atorvastatin  80 mg Oral Daily  . donepezil  5 mg Oral QHS  . gabapentin  600 mg Oral BID  . Influenza vac split quadrivalent PF  0.5 mL Intramuscular Tomorrow-1000  . insulin aspart  0-5 Units Subcutaneous QHS  . insulin aspart  0-9 Units Subcutaneous TID WC  . mometasone-formoterol  2 puff Inhalation BID  . oxyCODONE  10 mg Oral Q12H  . potassium chloride SA  20 mEq Oral Daily  . tiZANidine  2 mg Oral QHS  . warfarin  2.5 mg Oral ONCE-1800    Assessment: Patient admitted for leg pain w/ h/o dementia and mechanical aortic valve replacement being anticoagulated w/ warfarin PTA. Warfarin PTA regimen: Warfarin 5 mg Tues, Wed, Thurs, Sat, Sun Warfarin 2.5 mg Mon, Fri  10/23 INR 2.68   10/24 INR 2.85 currently therapeutic  Goal range for mechanical aortic (2.5 - 3.5)  Goal of Therapy:  INR 2.5-3.5 Monitor platelets by  anticoagulation protocol: Yes   Plan:  Will give patient warfarin 5 mg PO x 1 Will monitor daily INR's and adjust dose based on INR trend. Will monitor CBC's every 3 days and monitor for s/sx of bleeding.  Lu Duffel, PharmD Clinical Pharmacist 05/14/2018 9:23 AM

## 2018-05-14 NOTE — Progress Notes (Signed)
PT Cancellation Note  Patient Details Name: Rhonda Saunders MRN: [id number] DOB: Jan 25, 1932   Cancelled Treatment:    Reason Eval/Treat Not Completed: (Consult received and chart reviewed.  Patient currently eating breakfast.  Will re-attempt at later time/date as medically appropriate and available.)   Manning Luna H. Owens Shark, PT, DPT, NCS 05/14/18, 8:57 AM [phone]

## 2018-05-14 NOTE — Progress Notes (Signed)
[facility] [place] NAME: Rhonda Saunders    MR#:  [id number]  DATE OF BIRTH:  Dec 18, 1931  SUBJECTIVE:  CHIEF COMPLAINT:   Chief Complaint  Patient presents with  . Leg Pain   Patient is drowsy and sleepy.  She is demented. REVIEW OF SYSTEMS:  Review of Systems  Unable to perform ROS: Dementia    DRUG ALLERGIES:   Allergies  Allergen Reactions  . Pentazocine Lactate [Pentazocine]    VITALS:  Blood pressure (!) 150/62, pulse (!) 52, temperature (!) 97.5 F (36.4 C), temperature source Oral, resp. rate 19, height 5' 2.01" (1.575 m), weight 48.7 kg, SpO2 98 %. PHYSICAL EXAMINATION:  Physical Exam  Constitutional: No distress.  HENT:  Head: Normocephalic.  Mouth/Throat: Oropharynx is clear and moist.  Eyes: Conjunctivae and EOM are normal. No scleral icterus.  Neck: Neck supple. No JVD present. No tracheal deviation present.  Cardiovascular: Normal rate, regular rhythm and normal heart sounds. Exam reveals no gallop.  No murmur heard. Pulmonary/Chest: Effort normal and breath sounds normal. No respiratory distress. She has no wheezes. She has no rales.  Abdominal: Soft. Bowel sounds are normal. She exhibits no distension. There is no tenderness. There is no rebound.  Musculoskeletal: She exhibits no edema or tenderness.  Neurological:  Drowsy and sleepy, unable to exam.  Skin: No rash noted. No erythema.   LABORATORY PANEL:  Female CBC Recent Labs  Lab 05/14/18 0535  WBC 8.2  HGB 9.3*  HCT 30.5*  PLT 155   ------------------------------------------------------------------------------------------------------------------ Chemistries  Recent Labs  Lab 05/13/18 1816 05/14/18 0535  NA 144 146*  K 4.4 4.0  CL 105 106  CO2 30 33*  GLUCOSE 156* 103*  BUN 73* 65*  CREATININE 2.18* 1.87*  CALCIUM 8.6* 8.7*  AST 18  --   ALT 17  --   ALKPHOS 59  --   BILITOT 1.1  --    RADIOLOGY:  Ct Head Wo Contrast  Result Date:  05/13/2018 CLINICAL DATA:  Altered level of consciousness, leg pain and swelling for 2 weeks EXAM: CT HEAD WITHOUT CONTRAST TECHNIQUE: Contiguous axial images were obtained from the base of the skull through the vertex without intravenous contrast. COMPARISON:  05/11/2017 FINDINGS: Brain: Similar brain atrophy pattern and chronic white matter microvascular changes throughout both cerebral hemispheres. No acute hemorrhage, definite new infarction, mass lesion, midline shift, herniation, hydrocephalus or extra-axial fluid collection. No focal mass effect or edema. Cisterns are patent. Cerebellar atrophy as well. 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 Vascular: Intracranial atherosclerosis.  No hyperdense vessel. Skull: No acute osseous finding or fracture. Sinuses/Orbits: No acute finding. Other: None. IMPRESSION: Stable atrophy and chronic white matter microvascular changes No acute intracranial abnormality by noncontrast CT. 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 Electronically Signed   By: Jerilynn Mages.  Shick M.D.   On: 05/13/2018 19:47   Dg Chest Portable 1 View  Result Date: 05/13/2018 CLINICAL DATA:  Altered mental status EXAM: PORTABLE CHEST 1 VIEW COMPARISON:  None. FINDINGS: Prior CABG. Cardiomegaly. No confluent opacities, effusions or edema. No acute bony abnormality. IMPRESSION: Cardiomegaly.  No active disease. Electronically Signed   By: Rolm Baptise M.D.   On: 05/13/2018 19:35   ASSESSMENT AND PLAN:   *Acute kidney injury over CKD stage III secondary to poor oral intake and being on Lasix.  Continue IV hydration with half-normal saline at 50 mL/h.  Monitor input and output. Hold Lasix and ACE  inhibitor.  Follow-up BMP.  *Hypertension. Blood pressure in low normal range.  Hold metoprolol and ramipril  *Sinus bradycardia in the 50s.  Hold metoprolol  *Chronic diastolic CHF with chronic lower committee edema.  At this point due  to worsening creatinine hold Lasix.  stable.  *Mechanical aortic valve with anticoagulation with Coumadin.  Pharmacy to dose.  *Dementia.  Monitor for inpatient delirium.  *Pituitary adenoma Patient had MRI of the brain September 2019.  Per family this is being monitored as outpatient  Generalized weakness.  HHPT.  All the records are reviewed and case discussed with Care Management/Social Worker. Management plans discussed with the patient, her son and they are in agreement.  CODE STATUS: Full Code  TOTAL TIME TAKING CARE OF THIS PATIENT: 32 minutes.   More than 50% of the time was spent in counseling/coordination of care: YES  POSSIBLE D/C IN 1-2 DAYS, DEPENDING ON CLINICAL CONDITION.   Demetrios Loll M.D on 05/14/2018 at 12:47 PM  Between 7am to 6pm - Pager - [phone]  After 6pm go to [email or web address] - [facility]

## 2018-05-15 LAB — BASIC METABOLIC PANEL
Anion gap: 9 (ref 5–15)
BUN: 50 mg/dL — ABNORMAL HIGH (ref 8–23)
CALCIUM: 8.3 mg/dL — AB (ref 8.9–10.3)
CO2: 29 mmol/L (ref 22–32)
Chloride: 107 mmol/L (ref 98–111)
Creatinine, Ser: 1.51 mg/dL — ABNORMAL HIGH (ref 0.44–1.00)
GFR calc Af Amer: 35 mL/min — ABNORMAL LOW (ref >60)
GFR, EST NON AFRICAN AMERICAN: 31 mL/min — AB (ref >60)
Glucose, Bld: 123 mg/dL — ABNORMAL HIGH (ref 70–99)
Potassium: 3.8 mmol/L (ref 3.5–5.1)
Sodium: 145 mmol/L (ref 135–145)

## 2018-05-15 LAB — CBC
HEMATOCRIT: 30.5 % — AB (ref 36.0–46.0)
Hemoglobin: 9.5 g/dL — ABNORMAL LOW (ref 12.0–15.0)
MCH: 29.1 pg (ref 26.0–34.0)
MCHC: 31.1 g/dL (ref 30.0–36.0)
MCV: 93.6 fL (ref 80.0–100.0)
NRBC: 0 % (ref 0.0–0.2)
PLATELETS: 160 10*3/uL (ref 150–400)
RBC: 3.26 MIL/uL — ABNORMAL LOW (ref 3.87–5.11)
RDW: 18.2 % — AB (ref 11.5–15.5)
WBC: 9.6 10*3/uL (ref 4.0–10.5)

## 2018-05-15 LAB — GLUCOSE, CAPILLARY
GLUCOSE-CAPILLARY: 109 mg/dL — AB (ref 70–99)
GLUCOSE-CAPILLARY: 134 mg/dL — AB (ref 70–99)
GLUCOSE-CAPILLARY: 151 mg/dL — AB (ref 70–99)
Glucose-Capillary: 165 mg/dL — ABNORMAL HIGH (ref 70–99)
Glucose-Capillary: 201 mg/dL — ABNORMAL HIGH (ref 70–99)

## 2018-05-15 LAB — PROTIME-INR
INR: 3.34
PROTHROMBIN TIME: 33.4 s — AB (ref 11.4–15.2)

## 2018-05-15 LAB — MAGNESIUM: Magnesium: 2.5 mg/dL — ABNORMAL HIGH (ref 1.7–2.4)

## 2018-05-15 NOTE — Clinical Social Work Note (Signed)
Clinical Social Work Assessment  Patient Details  Name: Rhonda Saunders MRN: [id number] Date of Birth: 11/21/1931  Date of referral:  05/15/18               Reason for consult:  Facility Placement                Permission sought to share information with:    Permission granted to share information::     Name::        Agency::     Relationship::     Contact Information:     Housing/Transportation Living arrangements for the past 2 months:  Single Family Home Source of Information:  Adult Children Patient Interpreter Needed:  None Criminal Activity/Legal Involvement Pertinent to Current Situation/Hospitalization:  No - Comment as needed Significant Relationships:  Adult Children Lives with:  Adult Children Do you feel safe going back to the place where you live?  Yes Need for family participation in patient care:  Yes (Comment)  Care giving concerns:  Patient resides at home with her son: Rhonda Saunders Stay: [phone].     Social Worker assessment / plan:  Patient has been seen by PT and they are recommending short term rehab. Patient is with baseline dementia. CSW contacted her son and discussed the recommendations. He state she has not been to rehab prior. Patient's son is in agreement with rehab. Bed search to be initiated today.   Employment status:    Insurance information:    PT Recommendations:  [place] / Referral to community resources:     Patient/Family's Response to care:  Patient's son expressed appreciation for CSW assistance.  Patient/Family's Understanding of and Emotional Response to Diagnosis, Current Treatment, and Prognosis:  Patient's son believes rehab would be in patient's best interest.   Emotional Assessment Appearance:    Attitude/Demeanor/Rapport:    Affect (typically observed):    Orientation:    Alcohol / Substance use:  Not Applicable Psych involvement (Current and /or in the community):  No (Comment)  Discharge Needs   Concerns to be addressed:  Care Coordination Readmission within the last 30 days:  No Current discharge risk:  None Barriers to Discharge:  No Barriers Identified   Shela Leff, LCSW 05/15/2018, 3:50 PM

## 2018-05-15 NOTE — Consult Note (Signed)
[facility] Nurse wound consult note Reason for Consult:Chronic venous stasis and edema. Will apply Unna boots today.  Wound type:scattered nonintact partial thickness abrasions to left anterior lower leg Pressure Injury POA: NA Measurement: 0.3 cm diameter Wound SHF:WYOVZCH Drainage (amount, consistency, odor) minimal weeping Periwound:edema and erythema.  Chronic skin changes Dressing procedure/placement/frequency:Cleanse legs with soap and water.  ZInc layer from below toes to below knee.  Secure with self adherent coban.  Change weekly on Friday.  Will not follow at this time.  Please re-consult if needed.  Domenic Moras MSN, RN, FNP-BC CWON Wound, Ostomy, Continence Nurse Pager [phone]

## 2018-05-15 NOTE — Plan of Care (Signed)
WOCN has seen the patient and has cover the left and right lower extremities. The patient has been stable. Oriented x3 person, time and place. No falls. Incontinent off urine.  Problem: Education: Goal: Knowledge of General Education information will improve Description Including pain rating scale, medication(s)/side effects and non-pharmacologic comfort measures Outcome: Progressing   Problem: Health Behavior/Discharge Planning: Goal: Ability to manage health-related needs will improve Outcome: Progressing   Problem: Clinical Measurements: Goal: Ability to maintain clinical measurements within normal limits will improve Outcome: Progressing Goal: Will remain free from infection Outcome: Progressing Goal: Diagnostic test results will improve Outcome: Progressing Goal: Respiratory complications will improve Outcome: Progressing Goal: Cardiovascular complication will be avoided Outcome: Progressing   Problem: Activity: Goal: Risk for activity intolerance will decrease Outcome: Progressing   Problem: Nutrition: Goal: Adequate nutrition will be maintained Outcome: Progressing   Problem: Coping: Goal: Level of anxiety will decrease Outcome: Progressing   Problem: Elimination: Goal: Will not experience complications related to bowel motility Outcome: Progressing Goal: Will not experience complications related to urinary retention Outcome: Progressing   Problem: Pain Managment: Goal: General experience of comfort will improve Outcome: Progressing   Problem: Safety: Goal: Ability to remain free from injury will improve Outcome: Progressing   Problem: Skin Integrity: Goal: Risk for impaired skin integrity will decrease Outcome: Progressing

## 2018-05-15 NOTE — Progress Notes (Signed)
[facility] at [place] NAME: Rhonda Saunders    MR#:  [id number]  DATE OF BIRTH:  October 13, 1931  SUBJECTIVE:  CHIEF COMPLAINT:   Chief Complaint  Patient presents with  . Leg Pain   Patient is more awake.  She is demented and denies any symptoms. REVIEW OF SYSTEMS:  Review of Systems  Unable to perform ROS: Dementia    DRUG ALLERGIES:   Allergies  Allergen Reactions  . Pentazocine Lactate [Pentazocine]    VITALS:  Blood pressure (!) 137/57, pulse 70, temperature 98.4 F (36.9 C), temperature source Oral, resp. rate 16, height 5' 2.01" (1.575 m), weight 106.8 kg, SpO2 97 %. PHYSICAL EXAMINATION:  Physical Exam  Constitutional: No distress.  HENT:  Head: Normocephalic.  Mouth/Throat: Oropharynx is clear and moist.  Eyes: Conjunctivae and EOM are normal. No scleral icterus.  Neck: Neck supple. No JVD present. No tracheal deviation present.  Cardiovascular: Normal rate, regular rhythm and normal heart sounds. Exam reveals no gallop.  No murmur heard. Pulmonary/Chest: Effort normal and breath sounds normal. No respiratory distress. She has no wheezes. She has no rales.  Abdominal: Soft. Bowel sounds are normal. She exhibits no distension. There is no tenderness. There is no rebound.  Musculoskeletal: She exhibits no edema or tenderness.  Neurological: No cranial nerve deficit.  Demented.  Skin: No rash noted. No erythema.   LABORATORY PANEL:  Female CBC Recent Labs  Lab 05/15/18 0617  WBC 9.6  HGB 9.5*  HCT 30.5*  PLT 160   ------------------------------------------------------------------------------------------------------------------ Chemistries  Recent Labs  Lab 05/13/18 1816  05/15/18 0617  NA 144   < > 145  K 4.4   < > 3.8  CL 105   < > 107  CO2 30   < > 29  GLUCOSE 156*   < > 123*  BUN 73*   < > 50*  CREATININE 2.18*   < > 1.51*  CALCIUM 8.6*   < > 8.3*  MG  --   --  2.5*  AST 18  --   --   ALT 17  --   --     ALKPHOS 59  --   --   BILITOT 1.1  --   --    < > = values in this interval not displayed.   RADIOLOGY:  No results found. ASSESSMENT AND PLAN:   *Acute kidney injury over CKD stage III secondary to poor oral intake and being on Lasix.   Improved with IV hydration with half-normal saline at 50 mL/h.  Monitor input and output. Hold Lasix and ACE inhibitor.  *Hypertension. Blood pressure in low normal range.  Hold metoprolol and ramipril  *Sinus bradycardia in the 50s.  Hold metoprolol.  Bradycardia improved.  *Chronic diastolic CHF with chronic lower committee edema.  At this point due to worsening creatinine hold Lasix.  stable.  *Mechanical aortic valve with anticoagulation with Coumadin.  Pharmacy to dose. INR is therapeutic.  *Dementia.    Aspiration the fall precaution.  *Pituitary adenoma Patient had MRI of the brain September 2019.  Per family this is being monitored as outpatient  Generalized weakness.  PT evaluation suggest skilled nursing facility. The patient want to go home.  Her son prefers nursing facility.  All the records are reviewed and case discussed with Care Management/Social Worker. Management plans discussed with the patient, her son and they are in agreement.  CODE STATUS: Full Code  TOTAL TIME TAKING CARE OF  THIS PATIENT: 32 minutes.   More than 50% of the time was spent in counseling/coordination of care: YES  POSSIBLE D/C IN 1-2 DAYS, DEPENDING ON CLINICAL CONDITION.   Demetrios Loll M.D on 05/15/2018 at 2:45 PM  Between 7am to 6pm - Pager - [phone]  After 6pm go to [email or web address] - [facility]

## 2018-05-15 NOTE — Progress Notes (Addendum)
ANTICOAGULATION CONSULT NOTE - Initial Consult  Pharmacy Consult for warfarin Indication: aortic valve replacement  Allergies  Allergen Reactions  . Pentazocine Lactate [Pentazocine]     Patient Measurements: Height: 5' 2.01" (157.5 cm) Weight: 235 lb 7.2 oz (106.8 kg) IBW/kg (Calculated) : 50.12  Vital Signs: Temp: 98.7 F (37.1 C) (10/25 0525) Temp Source: Axillary (10/25 0525) BP: 118/49 (10/25 0525) Pulse Rate: 68 (10/25 0525)  Labs: Recent Labs    05/13/18 1816 05/13/18 2257 05/14/18 0535 05/15/18 0617  HGB 9.6*  --  9.3* 9.5*  HCT 30.6*  --  30.5* 30.5*  PLT 165  --  155 160  LABPROT  --  28.1* 29.5* 33.4*  INR  --  2.68 2.85 3.34  CREATININE 2.18*  --  1.87* 1.51*  TROPONINI <0.03  --   --   --     Estimated Creatinine Clearance: 31.9 mL/min (A) (by C-G formula based on SCr of 1.51 mg/dL (H)).   Medical History: Past Medical History:  Diagnosis Date  . CHF (congestive heart failure) ([place])   . COPD (chronic obstructive pulmonary disease) ([place])   . Coronary artery disease   . Diabetes mellitus without complication ([place])   . Hypertension     Medications:  Scheduled:  . atorvastatin  80 mg Oral Daily  . donepezil  5 mg Oral QHS  . gabapentin  300 mg Oral BID  . Influenza vac split quadrivalent PF  0.5 mL Intramuscular Tomorrow-1000  . insulin aspart  0-5 Units Subcutaneous QHS  . insulin aspart  0-9 Units Subcutaneous TID WC  . mometasone-formoterol  2 puff Inhalation BID  . oxyCODONE  10 mg Oral Q12H  . potassium chloride SA  20 mEq Oral Daily  . senna-docusate  2 tablet Oral QHS  . tiZANidine  2 mg Oral QHS  . warfarin  2.5 mg Oral Once per day on Mon Fri  . warfarin  5 mg Oral Once per day on Sun Tue Wed Thu Sat  . Warfarin - Pharmacist Dosing Inpatient   Does not apply q1800    Assessment: Patient admitted for leg pain w/ h/o dementia and mechanical aortic valve replacement being anticoagulated w/ warfarin PTA. Warfarin PTA  regimen: Warfarin 5 mg Tues, Wed, Thurs, Sat, Sun Warfarin 2.5 mg Mon, Fri  10/23 INR 2.68  (5mg ) 10/24 INR 2.85  (5mg ) 10/25 INR 3.34    Goal range for mechanical aortic (2.5 - 3.5) Patient is currently in therapeutic INR range with stable HGB.   Goal of Therapy:  INR 2.5-3.5 Monitor platelets by anticoagulation protocol: Yes   Plan:  Will continue patient's PTA regimen (today's dose 2.5mg ) Will monitor daily INR's and adjust dose based on INR trend. Will monitor CBC's every 3 days and monitor for s/sx of bleeding.  Lu Duffel, PharmD Clinical Pharmacist 05/15/2018 7:12 AM

## 2018-05-15 NOTE — Care Management Important Message (Signed)
Copy of signed IM left with patient in room.  

## 2018-05-15 NOTE — NC FL2 (Signed)
[place] LEVEL OF CARE SCREENING TOOL     IDENTIFICATION  Patient Name: Rhonda Saunders Birthdate: [birth year] Sex: female Admission Date (Current Location): 05/13/2018  [place] and [place] Number:  Engineering geologist and Address:  [facility], [address], [place], [place] [address]      Provider Number: [id number]  Attending Physician Name and Address:  Demetrios Loll, MD  Relative Name and Phone Number:       Current Level of Care: Hospital Recommended Level of Care: [place] Prior Approval Number:    Date Approved/Denied:   PASRR Number:    Discharge Plan: SNF    Current Diagnoses: Patient Active Problem List   Diagnosis Date Noted  . Altered mental status   . Palliative care by specialist   . Goals of care, counseling/discussion   . AKI (acute kidney injury) ([place]) 05/13/2018    Orientation RESPIRATION BLADDER Height & Weight     Self, Place  Normal Continent Weight: 235 lb 7.2 oz (106.8 kg) Height:  5' 2.01" (157.5 cm)  BEHAVIORAL SYMPTOMS/MOOD NEUROLOGICAL BOWEL NUTRITION STATUS  (none) (none) Incontinent Diet(carb modified)  AMBULATORY STATUS COMMUNICATION OF NEEDS Skin   Extensive Assist Verbally PU Stage and Appropriate Care                       Personal Care Assistance Level of Assistance  Bathing, Feeding, Dressing Bathing Assistance: Limited assistance Feeding assistance: Limited assistance Dressing Assistance: Limited assistance     Functional Limitations Info  (none)          SPECIAL CARE FACTORS FREQUENCY  PT (By licensed PT)                    Contractures Contractures Info: Not present    Additional Factors Info  Code Status Code Status Info: full             Current Medications (05/15/2018):  This is the current hospital active medication list Current Facility-Administered Medications  Medication Dose Route Frequency Provider Last Rate Last Dose  .  acetaminophen (TYLENOL) tablet 650 mg  650 mg Oral Q6H PRN Hillary Bow, MD       Or  . acetaminophen (TYLENOL) suppository 650 mg  650 mg Rectal Q6H PRN Sudini, Srikar, MD      . albuterol (PROVENTIL) (2.5 MG/3ML) 0.083% nebulizer solution 2.5 mg  2.5 mg Nebulization Q2H PRN Sudini, Srikar, MD      . atorvastatin (LIPITOR) tablet 80 mg  80 mg Oral Daily Hillary Bow, MD   80 mg at 05/15/18 0854  . bisacodyl (DULCOLAX) EC tablet 5 mg  5 mg Oral Daily PRN Demetrios Loll, MD      . donepezil (ARICEPT) tablet 5 mg  5 mg Oral QHS Hillary Bow, MD   5 mg at 05/14/18 2222  . gabapentin (NEURONTIN) tablet 300 mg  300 mg Oral BID Demetrios Loll, MD   300 mg at 05/15/18 0854  . insulin aspart (novoLOG) injection 0-5 Units  0-5 Units Subcutaneous QHS Sudini, Srikar, MD      . insulin aspart (novoLOG) injection 0-9 Units  0-9 Units Subcutaneous TID WC Hillary Bow, MD   1 Units at 05/15/18 1213  . mometasone-formoterol (DULERA) 200-5 MCG/ACT inhaler 2 puff  2 puff Inhalation BID Hillary Bow, MD   2 puff at 05/15/18 0854  . ondansetron (ZOFRAN) tablet 4 mg  4 mg Oral Q6H PRN Hillary Bow, MD  Or  . ondansetron (ZOFRAN) injection 4 mg  4 mg Intravenous Q6H PRN Sudini, Alveta Heimlich, MD      . oxyCODONE (OXYCONTIN) 12 hr tablet 10 mg  10 mg Oral Q12H Hillary Bow, MD   10 mg at 05/15/18 0905  . polyethylene glycol (MIRALAX / GLYCOLAX) packet 17 g  17 g Oral Daily PRN Sudini, Srikar, MD      . potassium chloride SA (K-DUR,KLOR-CON) CR tablet 20 mEq  20 mEq Oral Daily Hillary Bow, MD   20 mEq at 05/15/18 0854  . senna-docusate (Senokot-S) tablet 2 tablet  2 tablet Oral QHS Philis Pique, NP   2 tablet at 05/14/18 2221  . tiZANidine (ZANAFLEX) tablet 2 mg  2 mg Oral QHS Hillary Bow, MD   2 mg at 05/14/18 2222  . warfarin (COUMADIN) tablet 2.5 mg  2.5 mg Oral Once per day on Mon Fri Lu Duffel, [facility]      . warfarin (COUMADIN) tablet 5 mg  5 mg Oral Once per day on Sun Tue Wed Thu Sat  Lu Duffel, [place]   5 mg at 05/14/18 1730  . Warfarin - Pharmacist Dosing Inpatient   Does not apply [place], [place]         Discharge Medications: Please see discharge summary for a list of discharge medications.  Relevant Imaging Results:  Relevant Lab Results:   Additional Information ss: [id number]  Shela Leff, LCSW

## 2018-05-16 DIAGNOSIS — L8990 Pressure ulcer of unspecified site, unspecified stage: Secondary | ICD-10-CM

## 2018-05-16 DIAGNOSIS — L89302 Pressure ulcer of unspecified buttock, stage 2: Secondary | ICD-10-CM

## 2018-05-16 LAB — GLUCOSE, CAPILLARY
GLUCOSE-CAPILLARY: 121 mg/dL — AB (ref 70–99)
GLUCOSE-CAPILLARY: 146 mg/dL — AB (ref 70–99)
Glucose-Capillary: 180 mg/dL — ABNORMAL HIGH (ref 70–99)
Glucose-Capillary: 164 mg/dL — ABNORMAL HIGH (ref 70–99)

## 2018-05-16 LAB — BASIC METABOLIC PANEL
Anion gap: 10 (ref 5–15)
BUN: 33 mg/dL — AB (ref 8–23)
CHLORIDE: 110 mmol/L (ref 98–111)
CO2: 25 mmol/L (ref 22–32)
Calcium: 8.4 mg/dL — ABNORMAL LOW (ref 8.9–10.3)
Creatinine, Ser: 1.31 mg/dL — ABNORMAL HIGH (ref 0.44–1.00)
GFR calc Af Amer: 42 mL/min — ABNORMAL LOW (ref >60)
GFR calc non Af Amer: 36 mL/min — ABNORMAL LOW (ref >60)
Glucose, Bld: 129 mg/dL — ABNORMAL HIGH (ref 70–99)
Potassium: 3.9 mmol/L (ref 3.5–5.1)
SODIUM: 145 mmol/L (ref 135–145)

## 2018-05-16 LAB — PROTIME-INR
INR: 3
Prothrombin Time: 30.7 seconds — ABNORMAL HIGH (ref 11.4–15.2)

## 2018-05-16 NOTE — Clinical Social Work Note (Signed)
The CSW has provided bed offers to the patient's son, Dellis Filbert. Dellis Filbert will contact the CSW with his choice most likely by the end of the day. The patient's PASRR has been received ([id number]). CSW is following.  Santiago Bumpers, MSW, Latanya Presser [phone]

## 2018-05-16 NOTE — Progress Notes (Signed)
[place] at [place] NAME: Rhonda Saunders    MR#:  [id number]  DATE OF BIRTH:  [birth year]  SUBJECTIVE:  CHIEF COMPLAINT:   Chief Complaint  Patient presents with  . Leg Pain   Patient is drowsy but arousable.  She is demented and denies any symptoms.  REVIEW OF SYSTEMS:  Review of Systems  Unable to perform ROS: Dementia    DRUG ALLERGIES:   Allergies  Allergen Reactions  . Pentazocine Lactate [Pentazocine]    VITALS:  Blood pressure (!) 147/63, pulse 83, temperature 98.1 F (36.7 C), resp. rate 20, height 5' 2.01" (1.575 m), weight 106.8 kg, SpO2 96 %. PHYSICAL EXAMINATION:  Physical Exam  Constitutional: No distress.  HENT:  Head: Normocephalic.  Mouth/Throat: Oropharynx is clear and moist.  Eyes: Conjunctivae and EOM are normal. No scleral icterus.  Neck: Neck supple. No JVD present. No tracheal deviation present.  Cardiovascular: Normal rate, regular rhythm and normal heart sounds. Exam reveals no gallop.  No murmur heard. Pulmonary/Chest: Effort normal and breath sounds normal. No respiratory distress. She has no wheezes. She has no rales.  Abdominal: Soft. Bowel sounds are normal. She exhibits no distension. There is no tenderness. There is no rebound.  Musculoskeletal: She exhibits no edema or tenderness.  Neurological: No cranial nerve deficit.  Demented.  Skin: No rash noted. No erythema.   LABORATORY PANEL:  Female CBC Recent Labs  Lab 05/15/18 0617  WBC 9.6  HGB 9.5*  HCT 30.5*  PLT 160   ------------------------------------------------------------------------------------------------------------------ Chemistries  Recent Labs  Lab 05/13/18 1816  05/15/18 0617 05/16/18 0615  NA 144   < > 145 145  K 4.4   < > 3.8 3.9  CL 105   < > 107 110  CO2 30   < > 29 25  GLUCOSE 156*   < > 123* 129*  BUN 73*   < > 50* 33*  CREATININE 2.18*   < > 1.51* 1.31*  CALCIUM 8.6*   < > 8.3* 8.4*  MG  --   --  2.5*  --    AST 18  --   --   --   ALT 17  --   --   --   ALKPHOS 59  --   --   --   BILITOT 1.1  --   --   --    < > = values in this interval not displayed.   RADIOLOGY:  No results found. ASSESSMENT AND PLAN:   *Acute kidney injury over CKD stage III secondary to poor oral intake and being on Lasix.   Improved with IV hydration with half-normal saline at 50 mL/h.  Monitor input and output. Hold Lasix and ACE inhibitor.  *Hypertension. Blood pressure in low normal range.  Hold metoprolol and ramipril Pressure improved now.  *Sinus bradycardia in the 50s.  Hold metoprolol.  Bradycardia improved.  *Chronic diastolic CHF with chronic lower committee edema.  At this point due to worsening creatinine hold Lasix.  stable.  *Mechanical aortic valve with anticoagulation with Coumadin.  Pharmacy to dose. INR is therapeutic.  *Dementia.    Aspiration the fall precaution.  *Pituitary adenoma. Patient had MRI of the brain September 2019.  Per family this is being monitored as outpatient.  Generalized weakness.  PT evaluation suggest skilled nursing facility. son prefers nursing facility. Social worker has given the choices to the son to choose from.  Likely discharge in 1 to 2  days.  All the records are reviewed and case discussed with Care Management/Social Worker. Management plans discussed with the patient, her son and they are in agreement.  CODE STATUS: Full Code  TOTAL TIME TAKING CARE OF THIS PATIENT: 32 minutes.   More than 50% of the time was spent in counseling/coordination of care: YES  POSSIBLE D/C IN 1-2 DAYS, DEPENDING ON CLINICAL CONDITION.   Vaughan Basta M.D on 05/16/2018 at 3:45 PM  Between 7am to 6pm - Pager - [phone]  After 6pm go to [email or web address] - [facility]

## 2018-05-16 NOTE — Progress Notes (Signed)
[place] for warfarin Indication: aortic valve replacement  Allergies  Allergen Reactions  . Pentazocine Lactate [Pentazocine]     Patient Measurements: Height: 5' 2.01" (157.5 cm) Weight: 235 lb 7.2 oz (106.8 kg) IBW/kg (Calculated) : 50.12  Vital Signs: Temp: 98.8 F (37.1 C) (10/26 0422) BP: 140/54 (10/26 0422) Pulse Rate: 82 (10/26 0422)  Labs: Recent Labs    05/13/18 1816  05/14/18 0535 05/15/18 0617 05/16/18 0615  HGB 9.6*  --  9.3* 9.5*  --   HCT 30.6*  --  30.5* 30.5*  --   PLT 165  --  155 160  --   LABPROT  --    < > 29.5* 33.4* 30.7*  INR  --    < > 2.85 3.34 3.00  CREATININE 2.18*  --  1.87* 1.51* 1.31*  TROPONINI <0.03  --   --   --   --    < > = values in this interval not displayed.    Estimated Creatinine Clearance: 36.7 mL/min (A) (by C-G formula based on SCr of 1.31 mg/dL (H)).   Medical History: Past Medical History:  Diagnosis Date  . CHF (congestive heart failure) ([place])   . COPD (chronic obstructive pulmonary disease) ([place])   . Coronary artery disease   . Diabetes mellitus without complication ([place])   . Hypertension     Medications:  Scheduled:  . atorvastatin  80 mg Oral Daily  . donepezil  5 mg Oral QHS  . gabapentin  300 mg Oral BID  . insulin aspart  0-5 Units Subcutaneous QHS  . insulin aspart  0-9 Units Subcutaneous TID WC  . mometasone-formoterol  2 puff Inhalation BID  . oxyCODONE  10 mg Oral Q12H  . potassium chloride SA  20 mEq Oral Daily  . senna-docusate  2 tablet Oral QHS  . tiZANidine  2 mg Oral QHS  . warfarin  2.5 mg Oral Once per day on Mon Fri  . warfarin  5 mg Oral Once per day on Sun Tue Wed Thu Sat  . Warfarin - Pharmacist Dosing Inpatient   Does not apply q1800    Assessment: Patient admitted for leg pain w/ h/o dementia and mechanical aortic valve replacement being anticoagulated w/ warfarin PTA. Warfarin PTA regimen: Warfarin 5 mg Tues, Wed, Thurs, Sat,  Sun Warfarin 2.5 mg Mon, Fri  10/23 INR 2.68  (5mg ) 10/24 INR 2.85  (5mg ) 10/25 INR 3.34  2.5 mg 10/26 INR 3.00  Goal range for mechanical aortic (2.5 - 3.5) Patient is currently in therapeutic INR range with stable HGB.   Goal of Therapy:  INR 2.5-3.5 Monitor platelets by anticoagulation protocol: Yes   Plan:  Will continue patient's PTA regimen. Will monitor daily INR's and adjust dose based on INR trend. Will monitor CBC's every 3 days and monitor for s/sx of bleeding.  Noralee Space, PharmD Clinical Pharmacist 05/16/2018 7:50 AM

## 2018-05-17 LAB — GLUCOSE, CAPILLARY
GLUCOSE-CAPILLARY: 146 mg/dL — AB (ref 70–99)
GLUCOSE-CAPILLARY: 111 mg/dL — AB (ref 70–99)
Glucose-Capillary: 161 mg/dL — ABNORMAL HIGH (ref 70–99)
Glucose-Capillary: 149 mg/dL — ABNORMAL HIGH (ref 70–99)

## 2018-05-17 LAB — PROTIME-INR
INR: 3.04
PROTHROMBIN TIME: 31 s — AB (ref 11.4–15.2)

## 2018-05-17 NOTE — Progress Notes (Signed)
[place] for warfarin Indication: aortic valve replacement  Allergies  Allergen Reactions  . Pentazocine Lactate [Pentazocine]     Patient Measurements: Height: 5' 2.01" (157.5 cm) Weight: 233 lb 4 oz (105.8 kg) IBW/kg (Calculated) : 50.12  Vital Signs: Temp: 98.5 F (36.9 C) (10/27 0509) BP: 148/66 (10/27 0509) Pulse Rate: 98 (10/27 0509)  Labs: Recent Labs    05/15/18 0617 05/16/18 0615 05/17/18 0448  HGB 9.5*  --   --   HCT 30.5*  --   --   PLT 160  --   --   LABPROT 33.4* 30.7* 31.0*  INR 3.34 3.00 3.04  CREATININE 1.51* 1.31*  --     Estimated Creatinine Clearance: 36.5 mL/min (A) (by C-G formula based on SCr of 1.31 mg/dL (H)).   Medical History: Past Medical History:  Diagnosis Date  . CHF (congestive heart failure) ([place])   . COPD (chronic obstructive pulmonary disease) ([place])   . Coronary artery disease   . Diabetes mellitus without complication ([place])   . Hypertension     Medications:  Scheduled:  . atorvastatin  80 mg Oral Daily  . donepezil  5 mg Oral QHS  . gabapentin  300 mg Oral BID  . insulin aspart  0-5 Units Subcutaneous QHS  . insulin aspart  0-9 Units Subcutaneous TID WC  . mometasone-formoterol  2 puff Inhalation BID  . oxyCODONE  10 mg Oral Q12H  . potassium chloride SA  20 mEq Oral Daily  . senna-docusate  2 tablet Oral QHS  . tiZANidine  2 mg Oral QHS  . warfarin  2.5 mg Oral Once per day on Mon Fri  . warfarin  5 mg Oral Once per day on Sun Tue Wed Thu Sat  . Warfarin - Pharmacist Dosing Inpatient   Does not apply q1800    Assessment: Patient admitted for leg pain w/ h/o dementia and mechanical aortic valve replacement being anticoagulated w/ warfarin PTA. Warfarin PTA regimen: Warfarin 5 mg Tues, Wed, Thurs, Sat, Sun Warfarin 2.5 mg Mon, Fri  10/23 INR 2.68  (5mg ) 10/24 INR 2.85  (5mg ) 10/25 INR 3.34  2.5 mg 10/26 INR 3.00  5 mg 10/27 INR 3.04  Goal range for mechanical aortic  (2.5 - 3.5) Patient is currently in therapeutic INR range with stable HGB.   Goal of Therapy:  INR 2.5-3.5 Monitor platelets by anticoagulation protocol: Yes   Plan:  Will continue patient's PTA regimen. Will monitor daily INR's and adjust dose based on INR trend. Will monitor CBC's every 3 days and monitor for s/sx of bleeding.  Noralee Space, PharmD Clinical Pharmacist 05/17/2018 10:09 AM

## 2018-05-17 NOTE — Progress Notes (Signed)
05/17/2018 6:59 PM  Left hand increased swelling. Armband indenting in skin. Cut armband and replaced on opposite wrist. Paged Dr. Anselm Jungling.   Rhonda Saunders D Kinsey Karch

## 2018-05-17 NOTE — Progress Notes (Signed)
Rhonda Saunders at [facility] NAME: Rhonda Saunders    MR#:  [id number]  DATE OF BIRTH:  02/01/1932  SUBJECTIVE:  CHIEF COMPLAINT:   Chief Complaint  Patient presents with  . Leg Pain   Patient is drowsy but arousable.  She is demented and denies any symptoms.  REVIEW OF SYSTEMS:  Review of Systems  Unable to perform ROS: Dementia    DRUG ALLERGIES:   Allergies  Allergen Reactions  . Pentazocine Lactate [Pentazocine]    VITALS:  Blood pressure (!) 138/59, pulse 93, temperature 98.3 F (36.8 C), temperature source Oral, resp. rate 16, height 5' 2.01" (1.575 m), weight 105.8 kg, SpO2 96 %. PHYSICAL EXAMINATION:  Physical Exam  Constitutional: No distress.  HENT:  Head: Normocephalic.  Mouth/Throat: Oropharynx is clear and moist.  Eyes: Conjunctivae and EOM are normal. No scleral icterus.  Neck: Neck supple. No JVD present. No tracheal deviation present.  Cardiovascular: Normal rate, regular rhythm and normal heart sounds. Exam reveals no gallop.  No murmur heard. Pulmonary/Chest: Effort normal and breath sounds normal. No respiratory distress. She has no wheezes. She has no rales.  Abdominal: Soft. Bowel sounds are normal. She exhibits no distension. There is no tenderness. There is no rebound.  Musculoskeletal: She exhibits no edema or tenderness.  Neurological: No cranial nerve deficit.  Demented.  Skin: No rash noted. No erythema.   LABORATORY PANEL:  Female CBC Recent Labs  Lab 05/15/18 0617  WBC 9.6  HGB 9.5*  HCT 30.5*  PLT 160   ------------------------------------------------------------------------------------------------------------------ Chemistries  Recent Labs  Lab 05/13/18 1816  05/15/18 0617 05/16/18 0615  NA 144   < > 145 145  K 4.4   < > 3.8 3.9  CL 105   < > 107 110  CO2 30   < > 29 25  GLUCOSE 156*   < > 123* 129*  BUN 73*   < > 50* 33*  CREATININE 2.18*   < > 1.51* 1.31*  CALCIUM 8.6*   < > 8.3*  8.4*  MG  --   --  2.5*  --   AST 18  --   --   --   ALT 17  --   --   --   ALKPHOS 59  --   --   --   BILITOT 1.1  --   --   --    < > = values in this interval not displayed.   RADIOLOGY:  No results found. ASSESSMENT AND PLAN:   *Acute kidney injury over CKD stage III secondary to poor oral intake and being on Lasix.   Improved with IV hydration with half-normal saline at 50 mL/h.  Monitor input and output. Hold Lasix and ACE inhibitor.  *Hypertension. Blood pressure in low normal range.  Hold metoprolol and ramipril Pressure improved now.  *Sinus bradycardia in the 50s.  Hold metoprolol.  Bradycardia improved.  *Chronic diastolic CHF with chronic lower committee edema.  At this point due to worsening creatinine hold Lasix.  stable.  *Mechanical aortic valve with anticoagulation with Coumadin.  Pharmacy to dose. INR is therapeutic.  *Dementia.    Aspiration the fall precaution.  *Pituitary adenoma. Patient had MRI of the brain September 2019.  Per family this is being monitored as outpatient.  Generalized weakness.  PT evaluation suggest skilled nursing facility. son prefers nursing facility. Social worker has given the choices to the son to choose from.  Likely discharge in  1 to 2 days.  All the records are reviewed and case discussed with Care Management/Social Worker. Management plans discussed with the patient, her son and they are in agreement.  CODE STATUS: Full Code  TOTAL TIME TAKING CARE OF THIS PATIENT: 32 minutes.   More than 50% of the time was spent in counseling/coordination of care: YES  POSSIBLE D/C IN 1-2 DAYS, DEPENDING ON CLINICAL CONDITION. Encourage son to meet with social worker to discuss her placement options for most likely discharge tomorrow.  Vaughan Basta M.D on 05/17/2018 at 4:31 PM  Between 7am to 6pm - Pager - [phone]  After 6pm go to [email or web address] - [facility] Hospital[facility]

## 2018-05-18 ENCOUNTER — Inpatient Hospital Stay: Payer: Medicare Other

## 2018-05-18 LAB — URINALYSIS, COMPLETE (UACMP) WITH MICROSCOPIC
BILIRUBIN URINE: NEGATIVE
Glucose, UA: NEGATIVE mg/dL
KETONES UR: NEGATIVE mg/dL
LEUKOCYTES UA: NEGATIVE
NITRITE: NEGATIVE
Protein, ur: 30 mg/dL — AB
SPECIFIC GRAVITY, URINE: 1.017 (ref 1.005–1.030)
pH: 6 (ref 5.0–8.0)

## 2018-05-18 LAB — CBC
HCT: 30.4 % — ABNORMAL LOW (ref 36.0–46.0)
Hemoglobin: 9.5 g/dL — ABNORMAL LOW (ref 12.0–15.0)
MCH: 29.2 pg (ref 26.0–34.0)
MCHC: 31.3 g/dL (ref 30.0–36.0)
MCV: 93.5 fL (ref 80.0–100.0)
NRBC: 0 % (ref 0.0–0.2)
Platelets: 151 10*3/uL (ref 150–400)
RBC: 3.25 MIL/uL — ABNORMAL LOW (ref 3.87–5.11)
RDW: 17.8 % — AB (ref 11.5–15.5)
WBC: 16.3 10*3/uL — ABNORMAL HIGH (ref 4.0–10.5)

## 2018-05-18 LAB — GLUCOSE, CAPILLARY
GLUCOSE-CAPILLARY: 107 mg/dL — AB (ref 70–99)
GLUCOSE-CAPILLARY: 98 mg/dL (ref 70–99)
Glucose-Capillary: 109 mg/dL — ABNORMAL HIGH (ref 70–99)
Glucose-Capillary: 130 mg/dL — ABNORMAL HIGH (ref 70–99)

## 2018-05-18 LAB — PROTIME-INR
INR: 3.24
Prothrombin Time: 32.6 seconds — ABNORMAL HIGH (ref 11.4–15.2)

## 2018-05-18 MED ORDER — METOPROLOL SUCCINATE ER 25 MG PO TB24
25.0000 mg | ORAL_TABLET | Freq: Every day | ORAL | Status: DC
  Administered 2018-05-18 – 2018-05-19 (×2): 25 mg via ORAL
  Filled 2018-05-18 (×2): qty 1

## 2018-05-18 NOTE — Progress Notes (Signed)
Physical Therapy Treatment Patient Details Name: Rhonda Saunders MRN: [id number] DOB: 10-18-1931 Today's Date: 05/18/2018    History of Present Illness presented to ER secondary to progressive weakness, decreased PO intake; admitted for management of AKI.    PT Comments    Pt in bed, awake, requesting to use bathroom.  +2 nursing students in room to assist with mobility.  While pt requesting to get up to bathroom, she resists mobility.  "Just wait a minute" was repeated frequently during session.  Attempted to assist pt to edge of bed by assisting LE's and she would scream loudly with just light touch to either leg at lower or at thigh region.  Resists movement of either arm.  Son in for session and assisted with encouraging her and at the end by moving LE's which she tolerated somewhat better but still only allowed a few inches of movement.  RN in to give pain medication during session.  While pain is a limiting factor, behaviors and screaming inconsistent with her complaints.  Unable to transition pt to edge of bed or commode. Nursing students and RN remained in room for bath and bedpan as needed.   Follow Up Recommendations  SNF     Equipment Recommendations       Recommendations for Other Services       Precautions / Restrictions Precautions Precautions: Fall Restrictions Weight Bearing Restrictions: No Other Position/Activity Restrictions: fearful    Mobility  Bed Mobility               General bed mobility comments: unable to move more than a few inches towards edge of bed with increased time and encouragement  Transfers                    Ambulation/Gait                 Stairs             Wheelchair Mobility    Modified Rankin (Stroke Patients Only)       Balance                                            Cognition Arousal/Alertness: Awake/alert Behavior During Therapy: Anxious Overall Cognitive Status: History  of cognitive impairments - at baseline                                        Exercises      General Comments        Pertinent Vitals/Pain Faces Pain Scale: Hurts whole lot Pain Location: B LE's - pt screaming to just light touch anywhere on BLE's or in attempts to move BUE to assist with bed mobility.   Pain Intervention(s): Limited activity within patient's tolerance;Patient requesting pain meds-RN notified;RN gave pain meds during session    Home Living                      Prior Function            PT Goals (current goals can now be found in the care plan section) Progress towards PT goals: Not progressing toward goals - comment    Frequency    Min 2X/week      PT Plan Current plan  remains appropriate    Co-evaluation              AM-PAC PT "6 Clicks" Daily Activity  Outcome Measure  Difficulty turning over in bed (including adjusting bedclothes, sheets and blankets)?: Unable Difficulty moving from lying on back to sitting on the side of the bed? : Unable Difficulty sitting down on and standing up from a chair with arms (e.g., wheelchair, bedside commode, etc,.)?: Unable Help needed moving to and from a bed to chair (including a wheelchair)?: Total Help needed walking in hospital room?: Total Help needed climbing 3-5 steps with a railing? : Total 6 Click Score: 6    End of Session   Activity Tolerance: Patient limited by pain;Other (comment) Patient left: in bed;with call bell/phone within reach;with bed alarm set;with nursing/sitter in room;with family/visitor present   Pain - Right/Left: Right Pain - part of body: Knee     Time: 7078-6754 PT Time Calculation (min) (ACUTE ONLY): 19 min  Charges:  $Therapeutic Activity: 8-22 mins                    Chesley Noon, PTA 05/18/18, 11:25 AM

## 2018-05-18 NOTE — Clinical Social Work Note (Signed)
CSW spoke with patient's son this morning at patient's bedside. Patient's son wanted CSW to speak with Lovena Le at [facility] regarding if they could make a bed offer. [facility] is currently unable to make a bed offer at this time. Patient's son has accepted [facility]. CSW has informed Tina at [facility].  Shela Leff MSW,LCSW [phone]

## 2018-05-18 NOTE — Care Management Important Message (Signed)
Copy of signed IM left with patient in room.  

## 2018-05-18 NOTE — Progress Notes (Signed)
[place] for warfarin Indication: aortic valve replacement  Allergies  Allergen Reactions  . Pentazocine Lactate [Pentazocine]     Patient Measurements: Height: 5' 2.01" (157.5 cm) Weight: 229 lb 4.5 oz (104 kg) IBW/kg (Calculated) : 50.12  Vital Signs: Temp: 99.4 F (37.4 C) (10/28 0616) Temp Source: Oral (10/28 0616) BP: 152/63 (10/28 0616) Pulse Rate: 90 (10/28 0616)  Labs: Recent Labs    05/16/18 0615 05/17/18 0448 05/18/18 0513  HGB  --   --  9.5*  HCT  --   --  30.4*  PLT  --   --  151  LABPROT 30.7* 31.0* 32.6*  INR 3.00 3.04 3.24  CREATININE 1.31*  --   --     Estimated Creatinine Clearance: 36.2 mL/min (A) (by C-G formula based on SCr of 1.31 mg/dL (H)).   Assessment: Patient admitted for leg pain w/ h/o dementia and mechanical aortic valve replacement being anticoagulated w/ warfarin PTA. Per outpt anticoagulation clinic INR goal 2.5-3.5.  Warfarin PTA regimen: Warfarin 5 mg Tues, Wed, Thurs, Sat, Sun Warfarin 2.5 mg Mon, Fri  10/23 INR 2.68  (5mg ) 10/24 INR 2.85  (5mg ) 10/25 INR 3.34  2.5 mg 10/26 INR 3.00  5 mg 10/27 INR 3.04  5 mg 10/28 INR 3.24   Goal range for mechanical aortic (2.5 - 3.5) Patient is currently in therapeutic INR range with stable HGB.   Goal of Therapy:  INR 2.5-3.5 Monitor platelets by anticoagulation protocol: Yes   Plan:  Will continue patient's PTA regimen, due for warfarin 2.5 mg tonight. Will monitor daily INR's and adjust dose based on INR trend. Will monitor CBC's every 3 days and monitor for s/sx of bleeding. Hgb stable from three days ago.   Pharmacy will continue to follow.   Rocky Morel, PharmD Clinical Pharmacist 05/18/2018 7:12 AM

## 2018-05-18 NOTE — Progress Notes (Signed)
[facility] NAME: Rhonda Saunders    MR#:  [id number]  DATE OF BIRTH:  06/29/1932  SUBJECTIVE:  CHIEF COMPLAINT:   Chief Complaint  Patient presents with  . Leg Pain   Patient is drowsy but arousable.  She is demented , c/o generalized pain and not feeling good today.  REVIEW OF SYSTEMS:  Review of Systems  Unable to perform ROS: Dementia    DRUG ALLERGIES:   Allergies  Allergen Reactions  . Pentazocine Lactate [Pentazocine]    VITALS:  Blood pressure (!) 137/49, pulse 96, temperature 98.6 F (37 C), temperature source Oral, resp. rate 20, height 5' 2.01" (1.575 m), weight 104 kg, SpO2 95 %. PHYSICAL EXAMINATION:  Physical Exam  Constitutional: No distress.  HENT:  Head: Normocephalic.  Mouth/Throat: Oropharynx is clear and moist.  Eyes: Conjunctivae and EOM are normal. No scleral icterus.  Neck: Neck supple. No JVD present. No tracheal deviation present.  Cardiovascular: Normal rate, regular rhythm and normal heart sounds. Exam reveals no gallop.  No murmur heard. Pulmonary/Chest: Effort normal and breath sounds normal. No respiratory distress. She has no wheezes. She has no rales.  Abdominal: Soft. Bowel sounds are normal. She exhibits no distension. There is no tenderness. There is no rebound.  Musculoskeletal: She exhibits no edema or tenderness.  Neurological: No cranial nerve deficit.  Demented.  Skin: No rash noted. No erythema.   LABORATORY PANEL:  Female CBC Recent Labs  Lab 05/18/18 0513  WBC 16.3*  HGB 9.5*  HCT 30.4*  PLT 151   ------------------------------------------------------------------------------------------------------------------ Chemistries  Recent Labs  Lab 05/13/18 1816  05/15/18 0617 05/16/18 0615  NA 144   < > 145 145  K 4.4   < > 3.8 3.9  CL 105   < > 107 110  CO2 30   < > 29 25  GLUCOSE 156*   < > 123* 129*  BUN 73*   < > 50* 33*  CREATININE 2.18*   < > 1.51* 1.31*    CALCIUM 8.6*   < > 8.3* 8.4*  MG  --   --  2.5*  --   AST 18  --   --   --   ALT 17  --   --   --   ALKPHOS 59  --   --   --   BILITOT 1.1  --   --   --    < > = values in this interval not displayed.   RADIOLOGY:  Dg Chest 2 View  Result Date: 05/18/2018 CLINICAL DATA:  Cough.  Altered mental status EXAM: CHEST - 2 VIEW COMPARISON:  May 13, 2018 FINDINGS: There is no edema or consolidation. Heart is mildly enlarged with pulmonary vascularity normal. There is aortic atherosclerosis. Patient is status post coronary artery bypass grafting. No adenopathy. No bone lesions. IMPRESSION: No edema or consolidation. Mild cardiac enlargement. Postoperative changes noted. There is aortic atherosclerosis. Aortic Atherosclerosis (ICD10-I70.0). Electronically Signed   By: Lowella Grip III M.D.   On: 05/18/2018 14:26   ASSESSMENT AND PLAN:   *Acute kidney injury over CKD stage III secondary to poor oral intake and being on Lasix.   Improved with IV hydration with half-normal saline at 50 mL/h.  Monitor input and output. Hold Lasix and ACE inhibitor.  *Hypertension. Blood pressure in low normal range.  Hold metoprolol and ramipril Pressure improved now. start metoprolol  *Sinus bradycardia in the 50s.  Hold metoprolol.  Bradycardia improved.  start at lower dose.  *Chronic diastolic CHF with chronic lower committee edema.  At this point due to worsening creatinine hold Lasix.  stable.  *Mechanical aortic valve with anticoagulation with Coumadin.  Pharmacy to dose. INR is therapeutic.  *Dementia.    Aspiration the fall precaution.  *Pituitary adenoma. Patient had MRI of the brain September 2019.  Per family this is being monitored as outpatient.  * elevated WBCs   Check for UA and Xray chest, no fever.  Generalized weakness.  PT evaluation suggest skilled nursing facility. son prefers nursing facility. Social worker has given the choices to the son to choose from.  Likely  discharge in 1 to 2 days.  All the records are reviewed and case discussed with Care Management/Social Worker. Management plans discussed with the patient, her son and they are in agreement.  CODE STATUS: Full Code  TOTAL TIME TAKING CARE OF THIS PATIENT: 32 minutes.   More than 50% of the time was spent in counseling/coordination of care: YES  POSSIBLE D/C IN 1-2 DAYS, DEPENDING ON CLINICAL CONDITION. Encourage son to meet with social worker to discuss her placement options for most likely discharge tomorrow.  Vaughan Basta M.D on 05/18/2018 at 6:47 PM  Between 7am to 6pm - Pager - [phone]  After 6pm go to [email or web address] - [facility]

## 2018-05-19 LAB — BLOOD CULTURE ID PANEL (REFLEXED)
Acinetobacter baumannii: NOT DETECTED
CANDIDA ALBICANS: NOT DETECTED
CANDIDA GLABRATA: NOT DETECTED
CANDIDA KRUSEI: NOT DETECTED
CANDIDA PARAPSILOSIS: NOT DETECTED
CANDIDA TROPICALIS: NOT DETECTED
ENTEROBACTERIACEAE SPECIES: NOT DETECTED
Enterobacter cloacae complex: NOT DETECTED
Enterococcus species: NOT DETECTED
Escherichia coli: NOT DETECTED
HAEMOPHILUS INFLUENZAE: NOT DETECTED
KLEBSIELLA OXYTOCA: NOT DETECTED
Klebsiella pneumoniae: NOT DETECTED
Listeria monocytogenes: NOT DETECTED
METHICILLIN RESISTANCE: NOT DETECTED
Neisseria meningitidis: NOT DETECTED
Proteus species: NOT DETECTED
Pseudomonas aeruginosa: NOT DETECTED
STREPTOCOCCUS PYOGENES: NOT DETECTED
Serratia marcescens: NOT DETECTED
Staphylococcus aureus (BCID): NOT DETECTED
Staphylococcus species: DETECTED — AB
Streptococcus agalactiae: NOT DETECTED
Streptococcus pneumoniae: NOT DETECTED
Streptococcus species: NOT DETECTED

## 2018-05-19 LAB — CBC
HEMATOCRIT: 30.6 % — AB (ref 36.0–46.0)
Hemoglobin: 9.4 g/dL — ABNORMAL LOW (ref 12.0–15.0)
MCH: 28.9 pg (ref 26.0–34.0)
MCHC: 30.7 g/dL (ref 30.0–36.0)
MCV: 94.2 fL (ref 80.0–100.0)
PLATELETS: 161 10*3/uL (ref 150–400)
RBC: 3.25 MIL/uL — AB (ref 3.87–5.11)
RDW: 18 % — AB (ref 11.5–15.5)
WBC: 13.3 10*3/uL — ABNORMAL HIGH (ref 4.0–10.5)
nRBC: 0 % (ref 0.0–0.2)

## 2018-05-19 LAB — BASIC METABOLIC PANEL
Anion gap: 6 (ref 5–15)
BUN: 27 mg/dL — AB (ref 8–23)
CALCIUM: 8 mg/dL — AB (ref 8.9–10.3)
CO2: 27 mmol/L (ref 22–32)
Chloride: 110 mmol/L (ref 98–111)
Creatinine, Ser: 1.04 mg/dL — ABNORMAL HIGH (ref 0.44–1.00)
GFR calc Af Amer: 56 mL/min — ABNORMAL LOW (ref >60)
GFR, EST NON AFRICAN AMERICAN: 48 mL/min — AB (ref >60)
Glucose, Bld: 111 mg/dL — ABNORMAL HIGH (ref 70–99)
POTASSIUM: 4.1 mmol/L (ref 3.5–5.1)
Sodium: 143 mmol/L (ref 135–145)

## 2018-05-19 LAB — PROTIME-INR
INR: 3.94
PROTHROMBIN TIME: 37.9 s — AB (ref 11.4–15.2)

## 2018-05-19 LAB — GLUCOSE, CAPILLARY
GLUCOSE-CAPILLARY: 95 mg/dL (ref 70–99)
Glucose-Capillary: 132 mg/dL — ABNORMAL HIGH (ref 70–99)

## 2018-05-19 MED ORDER — GABAPENTIN 600 MG PO TABS: 300.0000 mg | ORAL_TABLET | Freq: Two times a day (BID) | ORAL | 0 refills | Status: DC

## 2018-05-19 MED ORDER — CEFUROXIME AXETIL 250 MG PO TABS: 250.0000 mg | ORAL_TABLET | Freq: Two times a day (BID) | ORAL | 0 refills | Status: AC

## 2018-05-19 MED ORDER — OXYCODONE HCL ER 10 MG PO T12A: 10.0000 mg | EXTENDED_RELEASE_TABLET | Freq: Two times a day (BID) | ORAL | 0 refills | Status: DC

## 2018-05-19 MED ORDER — WARFARIN SODIUM 5 MG PO TABS: 2.5000 mg | ORAL_TABLET | Freq: Once | ORAL | 0 refills | Status: DC

## 2018-05-19 MED ORDER — METOPROLOL SUCCINATE ER 25 MG PO TB24: 25.0000 mg | ORAL_TABLET | Freq: Every day | ORAL | 0 refills | Status: DC

## 2018-05-19 MED ORDER — POLYETHYLENE GLYCOL 3350 17 G PO PACK: 17.0000 g | PACK | Freq: Every day | ORAL | 0 refills | Status: DC | PRN

## 2018-05-19 NOTE — Progress Notes (Signed)
PHARMACY - PHYSICIAN COMMUNICATION CRITICAL VALUE ALERT - BLOOD CULTURE IDENTIFICATION (BCID)  Results for orders placed or performed during the hospital encounter of 05/13/18  Blood Culture ID Panel (Reflexed) (Collected: 05/18/2018  8:56 PM)  Result Value Ref Range   Enterococcus species NOT DETECTED NOT DETECTED   Listeria monocytogenes NOT DETECTED NOT DETECTED   Staphylococcus species DETECTED (A) NOT DETECTED   Staphylococcus aureus (BCID) NOT DETECTED NOT DETECTED   Methicillin resistance NOT DETECTED NOT DETECTED   Streptococcus species NOT DETECTED NOT DETECTED   Streptococcus agalactiae NOT DETECTED NOT DETECTED   Streptococcus pneumoniae NOT DETECTED NOT DETECTED   Streptococcus pyogenes NOT DETECTED NOT DETECTED   Acinetobacter baumannii NOT DETECTED NOT DETECTED   Enterobacteriaceae species NOT DETECTED NOT DETECTED   Enterobacter cloacae complex NOT DETECTED NOT DETECTED   Escherichia coli NOT DETECTED NOT DETECTED   Klebsiella oxytoca NOT DETECTED NOT DETECTED   Klebsiella pneumoniae NOT DETECTED NOT DETECTED   Proteus species NOT DETECTED NOT DETECTED   Serratia marcescens NOT DETECTED NOT DETECTED   Haemophilus influenzae NOT DETECTED NOT DETECTED   Neisseria meningitidis NOT DETECTED NOT DETECTED   Pseudomonas aeruginosa NOT DETECTED NOT DETECTED   Candida albicans NOT DETECTED NOT DETECTED   Candida glabrata NOT DETECTED NOT DETECTED   Candida krusei NOT DETECTED NOT DETECTED   Candida parapsilosis NOT DETECTED NOT DETECTED   Candida tropicalis NOT DETECTED NOT DETECTED    Name of physician (or Provider) Contacted: Vachhani   Changes to prescribed antibiotics required: Pt was discharged right after taking the call from lab.   Dr Anselm Jungling will call in an RX for cefuroxime to her nursing home.   William Laske D 05/19/2018  4:07 PM

## 2018-05-19 NOTE — Progress Notes (Signed)
Physical Therapy Treatment Patient Details Name: Rhonda Saunders MRN: [id number] DOB: August 19, 1931 Today's Date: 05/19/2018    History of Present Illness presented to ER secondary to progressive weakness, decreased PO intake; admitted for management of AKI.    PT Comments    Pt awake, agrees to exercises this am.  Pt allowed very limited ranges for BLE exercises 2 x 10.  She allowed only 5-10 degrees of motion and would resist if helped further.  She did not yell out when touched today.  She did continue to resist movement towards edge of bed.  She may benefit from lift transfer at SNF to allow for increased comfort and mobility along with pt and staff safety due to her resistance and fear.   Follow Up Recommendations  SNF     Equipment Recommendations       Recommendations for Other Services       Precautions / Restrictions Precautions Precautions: Fall Restrictions Weight Bearing Restrictions: No Other Position/Activity Restrictions: fearful    Mobility  Bed Mobility               General bed mobility comments: deferred as she resisted light exercises  Transfers                 General transfer comment: PT may benefit from lift to assist with OOB for pt and staff safety  Ambulation/Gait                 Stairs             Wheelchair Mobility    Modified Rankin (Stroke Patients Only)       Balance                                            Cognition Arousal/Alertness: Awake/alert Behavior During Therapy: WFL for tasks assessed/performed Overall Cognitive Status: History of cognitive impairments - at baseline                                        Exercises Other Exercises Other Exercises: Supine AA/PROM for ankle pumps, heel slides, SLR and ab/add 2 x 10 for very minimal ranges wit pt self limititing.    General Comments        Pertinent Vitals/Pain Pain Assessment: Faces Faces Pain Scale:  Hurts a little bit Pain Location: With LE AA/PROM - no screaming today Pain Descriptors / Indicators: Grimacing Pain Intervention(s): Limited activity within patient's tolerance;Monitored during session    Home Living                      Prior Function            PT Goals (current goals can now be found in the care plan section) Progress towards PT goals: Not progressing toward goals - comment    Frequency    Min 2X/week      PT Plan Current plan remains appropriate    Co-evaluation              AM-PAC PT "6 Clicks" Daily Activity  Outcome Measure  Difficulty turning over in bed (including adjusting bedclothes, sheets and blankets)?: Unable Difficulty moving from lying on back to sitting on the side of the bed? : Unable Difficulty sitting  down on and standing up from a chair with arms (e.g., wheelchair, bedside commode, etc,.)?: Unable Help needed moving to and from a bed to chair (including a wheelchair)?: Total Help needed walking in hospital room?: Total Help needed climbing 3-5 steps with a railing? : Total 6 Click Score: 6    End of Session   Activity Tolerance: Patient tolerated treatment well;Other (comment) Patient left: in bed;with call bell/phone within reach;with bed alarm set         Time: [phone] PT Time Calculation (min) (ACUTE ONLY): 17 min  Charges:  $Therapeutic Exercise: 8-22 mins                    Chesley Noon, PTA 05/19/18, 9:20 AM

## 2018-05-19 NOTE — Discharge Instructions (Signed)
Coumadin is held on discharge for 1 day due to INR 3.9- decreased dose some, need to check INR in next 4-5 days to adjust dose again.

## 2018-05-19 NOTE — Clinical Social Work Placement (Signed)
   CLINICAL SOCIAL WORK PLACEMENT  NOTE  Date:  05/19/2018  Patient Details  Name: Rhonda Saunders MRN: [id number] Date of Birth: 1931-11-19  Clinical Social Work is seeking post-discharge placement for this patient at the [place] level of care (*CSW will initial, date and re-position this form in  chart as items are completed):  Yes   Patient/family provided with [place] [facility] Work Department's list of facilities offering this level of care within the geographic area requested by the patient (or if unable, by the patient's family).  Yes   Patient/family informed of their freedom to choose among providers that offer the needed level of care, that participate in Medicare, Medicaid or managed care program needed by the patient, have an available bed and are willing to accept the patient.  Yes   Patient/family informed of [facility]'s ownership interest in [facility] and [facility], as well as of the fact that they are under no obligation to receive care at these facilities.  PASRR submitted to EDS on       PASRR number received on       Existing PASRR number confirmed on 05/19/18     FL2 transmitted to all facilities in geographic area requested by pt/family on 05/16/18     FL2 transmitted to all facilities within larger geographic area on       Patient informed that his/her managed care company has contracts with or will negotiate with certain facilities, including the following:        Yes   Patient/family informed of bed offers received.  Patient chooses bed at ([facility])     Physician recommends and patient chooses bed at [facility])    Patient to be transferred to ([facility]) on 05/19/18.  Patient to be transferred to facility by (EMS)     Patient family notified on 05/19/18 of transfer.  Name of family member notified:  Dellis Filbert)     PHYSICIAN       Additional Comment:    _______________________________________________ Shela Leff,  LCSW 05/19/2018, 2:21 PM

## 2018-05-19 NOTE — Clinical Social Work Note (Signed)
Patient discharging today to [facility]. Discharge information has been sent. Patient's son aware of discharge and wishes for patient to transport via EMS. Shela Leff MSW,LcSW [phone]

## 2018-05-19 NOTE — Progress Notes (Signed)
[facility]in for warfarin Indication: aortic valve replacement  Allergies  Allergen Reactions  . Pentazocine Lactate [Pentazocine]     Patient Measurements: Height: 5' 2.01" (157.5 cm) Weight: 229 lb 4.5 oz (104 kg) IBW/kg (Calculated) : 50.12  Vital Signs: Temp: 97.7 F (36.5 C) (10/29 0615) Temp Source: Oral (10/29 0615) BP: 159/77 (10/29 0615) Pulse Rate: 102 (10/29 0615)  Labs: Recent Labs    05/17/18 0448 05/18/18 0513 05/19/18 0446  HGB  --  9.5* 9.4*  HCT  --  30.4* 30.6*  PLT  --  151 161  LABPROT 31.0* 32.6* 37.9*  INR 3.04 3.24 3.94  CREATININE  --   --  1.04*    Estimated Creatinine Clearance: 45.6 mL/min (A) (by C-G formula based on SCr of 1.04 mg/dL (H)).   Assessment: Patient admitted for leg pain w/ h/o dementia and mechanical aortic valve replacement being anticoagulated w/ warfarin PTA. Per outpt anticoagulation clinic INR goal 2.5-3.5.  Warfarin PTA regimen: Warfarin 5 mg Tues, Wed, Thurs, Sat, Sun Warfarin 2.5 mg Mon, Fri  10/23 INR 2.68  (5mg ) 10/24 INR 2.85  (5mg ) 10/25 INR 3.34  2.5 mg 10/26 INR 3.00  5 mg 10/27 INR 3.04  5 mg 10/28 INR 3.24  2.5 mg 10/29 INR 3.94  Hold  Goal range for mechanical aortic (2.5 - 3.5) Patient is currently in therapeutic INR range with stable HGB.   Goal of Therapy:  INR 2.5-3.5 Monitor platelets by anticoagulation protocol: Yes   Plan:  Will hold dose for tonight as INR is supratherapeutic today. Will monitor daily INR's and adjust dose based on INR trend. Will monitor CBC's every 3 days and monitor for s/sx of bleeding. Hgb stable from three days ago.   Pharmacy will continue to follow.   Forrest Moron, PharmD Clinical Pharmacist 05/19/2018 7:20 AM

## 2018-05-19 NOTE — Progress Notes (Signed)
Report called to [facility] and EMS called for transport. Son, Dellis Filbert updated of discharge.

## 2018-05-19 NOTE — Discharge Summary (Addendum)
[facility] NAME: Rhonda Saunders    MR#:  [id number]  DATE OF BIRTH:  [birth year]  DATE OF ADMISSION:  05/13/2018 ADMITTING PHYSICIAN: Hillary Bow, MD  DATE OF DISCHARGE: 05/19/2018   PRIMARY CARE PHYSICIAN: [facility], [facility]    ADMISSION DIAGNOSIS:  Unable to walk [R26.2] AKI (acute kidney injury) ([place]) [N17.9] Altered mental status, unspecified altered mental status type [R41.82]  DISCHARGE DIAGNOSIS:  Active Problems:   AKI (acute kidney injury) ([place])   Altered mental status   Palliative care by specialist   Goals of care, counseling/discussion   Pressure injury of skin   SECONDARY DIAGNOSIS:   Past Medical History:  Diagnosis Date  . CHF (congestive heart failure) ([place])   . COPD (chronic obstructive pulmonary disease) ([place])   . Coronary artery disease   . Diabetes mellitus without complication ([place])   . Hypertension     HOSPITAL COURSE:   *Acute kidney injury over CKD stage III secondary to poor oral intake and being on Lasix.  Improved with IV hydration with half-normal saline at 50 mL/h. Monitor input and output. Hold Lasix and ACE inhibitor.  renal func improved now. Will stop Lasix and ACE inh in discharge.  *Hypertension. Blood pressure in low normal range. Hold metoprolol and ramipril Pressure improved now. start metoprolol at lower dose.  *Sinus bradycardia in the 50s. Hold metoprolol.  Bradycardia improved.  start at lower dose.  *Chronic diastolic CHF with chronic lower committee edema. At this point due to worsening creatinine hold Lasix. stable.  *Mechanical aortic valve with anticoagulation with Coumadin. Pharmacy to dose. INR is 3.9 on discharge- Hold coumadin for next 1 day- start from 05/21/18 , decreasing dose of coumadin, so need to check INR in 5 days and readjust dose, if needed.  *Dementia.   Aspiration the fall precaution.  *Pituitary adenoma. Patient had  MRI of the brain September 2019. Per family this is being monitored as outpatient.  * elevated WBCs   Check for UA and Xray chest, no fever.  WBCs came down.  * Bacteremia- with Staph   Giving cefuroxime oral for 12 days- need to follow final sensitivity with PMD, cultures sent on 05/18/18  Generalized weakness.  PT evaluation suggest skilled nursing facility.  DISCHARGE CONDITIONS:   stable.  CONSULTS OBTAINED:    DRUG ALLERGIES:   Allergies  Allergen Reactions  . Pentazocine Lactate [Pentazocine]     DISCHARGE MEDICATIONS:   Allergies as of 05/19/2018      Reactions   Pentazocine Lactate [pentazocine]       Medication List    STOP taking these medications   LASIX 40 MG tablet Generic drug:  furosemide   ramipril 10 MG capsule Commonly known as:  ALTACE     TAKE these medications   ADVAIR DISKUS 250-50 MCG/DOSE Aepb Generic drug:  Fluticasone-Salmeterol INHALE 1 PUFF INTO THE LUNGS ONCE DAILY AS DIRECTED   albuterol 108 (90 Base) MCG/ACT inhaler Commonly known as:  PROVENTIL HFA;VENTOLIN HFA Inhale 2 puffs into the lungs every 6 (six) hours as needed.   atorvastatin 80 MG tablet Commonly known as:  LIPITOR Take 1 tablet by mouth daily.   cefUROXime 250 MG tablet Commonly known as:  CEFTIN Take 1 tablet (250 mg total) by mouth 2 (two) times daily for 12 days.   donepezil 5 MG tablet Commonly known as:  ARICEPT Take 5 mg by mouth at bedtime.   gabapentin 600 MG tablet Commonly known  as:  NEURONTIN Take 0.5 tablets (300 mg total) by mouth 2 (two) times daily. What changed:  how much to take   metoprolol succinate 25 MG 24 hr tablet Commonly known as:  TOPROL-XL Take 1 tablet (25 mg total) by mouth daily. Start taking on:  05/20/2018 What changed:    medication strength  how much to take   oxyCODONE 10 mg 12 hr tablet Commonly known as:  OXYCONTIN Take 1 tablet (10 mg total) by mouth every 12 (twelve) hours.   polyethylene glycol  packet Commonly known as:  MIRALAX / GLYCOLAX Take 17 g by mouth daily as needed for mild constipation.   potassium chloride SA 20 MEQ tablet Commonly known as:  K-DUR,KLOR-CON TAKE 1 TABLET BY MOUTH TWICE A DAY.   tiZANidine 2 MG tablet Commonly known as:  ZANAFLEX Take 2 mg by mouth at bedtime.   warfarin 5 MG tablet Commonly known as:  COUMADIN Take 0.5-1 tablets (2.5-5 mg total) by mouth one time only at 6 PM. Take 2.5 mg Monday, wed, Friday and 5 mg the remaining days, and need to adjust the dose per INR. Start taking on:  05/21/2018 What changed:    when to take this  additional instructions  These instructions start on 05/21/2018. If you are unsure what to do until then, ask your doctor or other care provider.        DISCHARGE INSTRUCTIONS:    Follow with PMD. Check INR in next 5 days.  If you experience worsening of your admission symptoms, develop shortness of breath, life threatening emergency, suicidal or homicidal thoughts you must seek medical attention immediately by calling 911 or calling your MD immediately  if symptoms less severe.  You Must read complete instructions/literature along with all the possible adverse reactions/side effects for all the Medicines you take and that have been prescribed to you. Take any new Medicines after you have completely understood and accept all the possible adverse reactions/side effects.   Please note  You were cared for by a hospitalist during your hospital stay. If you have any questions about your discharge medications or the care you received while you were in the hospital after you are discharged, you can call the unit and asked to speak with the hospitalist on call if the hospitalist that took care of you is not available. Once you are discharged, your primary care physician will handle any further medical issues. Please note that NO REFILLS for any discharge medications will be authorized once you are discharged, as it  is imperative that you return to your primary care physician (or establish a relationship with a primary care physician if you do not have one) for your aftercare needs so that they can reassess your need for medications and monitor your lab values.    Today   CHIEF COMPLAINT:   Chief Complaint  Patient presents with  . Leg Pain    HISTORY OF PRESENT ILLNESS:  Rhonda Saunders  is a 84 y.o. female with a known history of dementia, mechanical aortic valve on Coumadin, diastolic CHF chronic lower extremity edema, hypertension, diabetes mellitus, CKD stage III presents to the emergency room due to weakness, poor oral intake.  She was recently put on increased dose of Lasix due to worsening lower committee swelling.  Her swelling has improved some but patient has grown weaker.  Poor oral intake.  Today in the emergency room she was found to have acute kidney injury with creatinine at 2 with baseline  being 1.4-1.  No signs of infection.   VITAL SIGNS:  Blood pressure (!) 131/57, pulse 73, temperature 98.7 F (37.1 C), temperature source Oral, resp. rate 16, height 5' 2.01" (1.575 m), weight 104 kg, SpO2 96 %.  I/O:    Intake/Output Summary (Last 24 hours) at 05/19/2018 1637 Last data filed at 05/19/2018 1207 Gross per 24 hour  Intake 120 ml  Output 800 ml  Net -680 ml    PHYSICAL EXAMINATION:   Constitutional: No distress.  HENT:  Head: Normocephalic.  Mouth/Throat: Oropharynx is clear and moist.  Eyes: Conjunctivae and EOM are normal. No scleral icterus.  Neck: Neck supple. No JVD present. No tracheal deviation present.  Cardiovascular: Normal rate, regular rhythm and normal heart sounds. Exam reveals no gallop.  No murmur heard. Pulmonary/Chest: Effort normal and breath sounds normal. No respiratory distress. She has no wheezes. She has no rales.  Abdominal: Soft. Bowel sounds are normal. She exhibits no distension. There is no tenderness. There is no rebound.  Musculoskeletal: She  exhibits no edema or tenderness.  Neurological: No cranial nerve deficit.  Demented.  Skin: No rash noted. No erythema.   DATA REVIEW:   CBC Recent Labs  Lab 05/19/18 0446  WBC 13.3*  HGB 9.4*  HCT 30.6*  PLT 161    Chemistries  Recent Labs  Lab 05/13/18 1816  05/15/18 0617  05/19/18 0446  NA 144   < > 145   < > 143  K 4.4   < > 3.8   < > 4.1  CL 105   < > 107   < > 110  CO2 30   < > 29   < > 27  GLUCOSE 156*   < > 123*   < > 111*  BUN 73*   < > 50*   < > 27*  CREATININE 2.18*   < > 1.51*   < > 1.04*  CALCIUM 8.6*   < > 8.3*   < > 8.0*  MG  --   --  2.5*  --   --   AST 18  --   --   --   --   ALT 17  --   --   --   --   ALKPHOS 59  --   --   --   --   BILITOT 1.1  --   --   --   --    < > = values in this interval not displayed.    Cardiac Enzymes Recent Labs  Lab 05/13/18 1816  TROPONINI <0.03    Microbiology Results  Results for orders placed or performed during the hospital encounter of 05/13/18  CULTURE, BLOOD (ROUTINE X 2) w Reflex to ID Panel     Status: None (Preliminary result)   Collection Time: 05/18/18  8:56 PM  Result Value Ref Range Status   Specimen Description BLOOD BLOOD LEFT FOREARM  Final   Special Requests   Final    BOTTLES DRAWN AEROBIC AND ANAEROBIC Blood Culture results may not be optimal due to an inadequate volume of blood received in culture bottles   Culture  Setup Time   Final    Organism ID to follow ANAEROBIC BOTTLE ONLY GRAM POSITIVE COCCI CRITICAL RESULT CALLED TO, READ BACK BY AND VERIFIED WITH: JASON ROBBINS 05/19/18 [place] Performed at [facility], [address], [place], [place] [address]    Culture GRAM POSITIVE COCCI  Final   Report Status PENDING  Incomplete  Blood Culture ID Panel (Reflexed)     Status: Abnormal   Collection Time: 05/18/18  8:56 PM  Result Value Ref Range Status   Enterococcus species NOT DETECTED NOT DETECTED Final   Listeria monocytogenes NOT DETECTED NOT DETECTED Final    Staphylococcus species DETECTED (A) NOT DETECTED Final    Comment: Methicillin (oxacillin) susceptible coagulase negative staphylococcus. Possible blood culture contaminant (unless isolated from more than one blood culture draw or clinical case suggests pathogenicity). No antibiotic treatment is indicated for blood  culture contaminants. CRITICAL RESULT CALLED TO, READ BACK BY AND VERIFIED WITH: JASON ROBBINS 05/19/18 1539 KLW    Staphylococcus aureus (BCID) NOT DETECTED NOT DETECTED Final   Methicillin resistance NOT DETECTED NOT DETECTED Final   Streptococcus species NOT DETECTED NOT DETECTED Final   Streptococcus agalactiae NOT DETECTED NOT DETECTED Final   Streptococcus pneumoniae NOT DETECTED NOT DETECTED Final   Streptococcus pyogenes NOT DETECTED NOT DETECTED Final   Acinetobacter baumannii NOT DETECTED NOT DETECTED Final   Enterobacteriaceae species NOT DETECTED NOT DETECTED Final   Enterobacter cloacae complex NOT DETECTED NOT DETECTED Final   Escherichia coli NOT DETECTED NOT DETECTED Final   Klebsiella oxytoca NOT DETECTED NOT DETECTED Final   Klebsiella pneumoniae NOT DETECTED NOT DETECTED Final   Proteus species NOT DETECTED NOT DETECTED Final   Serratia marcescens NOT DETECTED NOT DETECTED Final   Haemophilus influenzae NOT DETECTED NOT DETECTED Final   Neisseria meningitidis NOT DETECTED NOT DETECTED Final   Pseudomonas aeruginosa NOT DETECTED NOT DETECTED Final   Candida albicans NOT DETECTED NOT DETECTED Final   Candida glabrata NOT DETECTED NOT DETECTED Final   Candida krusei NOT DETECTED NOT DETECTED Final   Candida parapsilosis NOT DETECTED NOT DETECTED Final   Candida tropicalis NOT DETECTED NOT DETECTED Final    Comment: Performed at [facility], [address] [place], [place] [address]  CULTURE, BLOOD (ROUTINE X 2) w Reflex to ID Panel     Status: None (Preliminary result)   Collection Time: 05/18/18  9:10 PM  Result Value Ref Range Status    Specimen Description BLOOD LEFT HAND  Final   Special Requests   Final    BOTTLES DRAWN AEROBIC AND ANAEROBIC Blood Culture results may not be optimal due to an inadequate volume of blood received in culture bottles   Culture  Setup Time   Final    GRAM POSITIVE COCCI AEROBIC BOTTLE ONLY CRITICAL VALUE NOTED.  VALUE IS CONSISTENT WITH PREVIOUSLY REPORTED AND CALLED VALUE. Performed at [facility], [address], [place], [place] [address]    Culture [facility] POSITIVE COCCI  Final   Report Status PENDING  Incomplete    RADIOLOGY:  Dg Chest 2 View  Result Date: 05/18/2018 CLINICAL DATA:  Cough.  Altered mental status EXAM: CHEST - 2 VIEW COMPARISON:  May 13, 2018 FINDINGS: There is no edema or consolidation. Heart is mildly enlarged with pulmonary vascularity normal. There is aortic atherosclerosis. Patient is status post coronary artery bypass grafting. No adenopathy. No bone lesions. IMPRESSION: No edema or consolidation. Mild cardiac enlargement. Postoperative changes noted. There is aortic atherosclerosis. Aortic Atherosclerosis (ICD10-I70.0). Electronically Signed   By: Lowella Grip III M.D.   On: 05/18/2018 14:26    EKG:   Orders placed or performed during the hospital encounter of 05/13/18  . ED EKG  . ED EKG  . EKG 12-Lead  . EKG 12-Lead      Management plans discussed with the patient, family  and they are in agreement.  CODE STATUS:     Code Status Orders  (From admission, onward)         Start     Ordered   05/13/18 2051  Full code  Continuous     05/13/18 2051        Code Status History    This patient has a current code status but no historical code status.    Advance Directive Documentation     Most Recent Value  Type of Advance Directive  Healthcare Power of Attorney  Pre-existing out of facility DNR order (yellow form or pink MOST form)  -  "MOST" Form in Place?  -      TOTAL TIME TAKING CARE OF THIS PATIENT: 35 minutes.     Vaughan Basta M.D on 05/19/2018 at 4:37 PM  Between 7am to 6pm - Pager - [phone]  After 6pm go to [email or web address] - password [email or web address] [facility]  Office  [phone]  CC: Primary care physician; Katheren Shams   Note: This dictation was prepared with Dragon dictation along with smaller phrase technology. Any transcriptional errors that result from this process are unintentional.

## 2018-05-22 LAB — CULTURE, BLOOD (ROUTINE X 2)

## 2018-06-29 ENCOUNTER — Ambulatory Visit: Payer: Medicare Other | Admitting: Physician Assistant

## 2018-07-27 ENCOUNTER — Other Ambulatory Visit: Payer: Self-pay

## 2018-07-27 ENCOUNTER — Encounter: Payer: Self-pay | Admitting: Nurse Practitioner

## 2018-07-27 ENCOUNTER — Ambulatory Visit: Payer: Medicare Other | Attending: Nurse Practitioner | Admitting: Nurse Practitioner

## 2018-07-27 ENCOUNTER — Encounter (INDEPENDENT_AMBULATORY_CARE_PROVIDER_SITE_OTHER): Payer: Self-pay

## 2018-07-27 VITALS — BP 132/48 | HR 65 | Temp 98.0°F | Ht 64.0 in | Wt 227.0 lb

## 2018-07-27 DIAGNOSIS — M899 Disorder of bone, unspecified: Secondary | ICD-10-CM | POA: Insufficient documentation

## 2018-07-27 DIAGNOSIS — M549 Dorsalgia, unspecified: Secondary | ICD-10-CM | POA: Diagnosis not present

## 2018-07-27 DIAGNOSIS — M79605 Pain in left leg: Secondary | ICD-10-CM | POA: Diagnosis not present

## 2018-07-27 DIAGNOSIS — Z789 Other specified health status: Secondary | ICD-10-CM | POA: Insufficient documentation

## 2018-07-27 DIAGNOSIS — R600 Localized edema: Secondary | ICD-10-CM | POA: Insufficient documentation

## 2018-07-27 DIAGNOSIS — G8929 Other chronic pain: Secondary | ICD-10-CM | POA: Insufficient documentation

## 2018-07-27 DIAGNOSIS — M792 Neuralgia and neuritis, unspecified: Secondary | ICD-10-CM | POA: Insufficient documentation

## 2018-07-27 DIAGNOSIS — Z79899 Other long term (current) drug therapy: Secondary | ICD-10-CM | POA: Insufficient documentation

## 2018-07-27 DIAGNOSIS — G894 Chronic pain syndrome: Secondary | ICD-10-CM | POA: Insufficient documentation

## 2018-07-27 DIAGNOSIS — Z79891 Long term (current) use of opiate analgesic: Secondary | ICD-10-CM | POA: Diagnosis present

## 2018-07-27 NOTE — Patient Instructions (Addendum)
____________________________________________________________________________________________  Appointment Policy Summary  It is our goal and responsibility to provide the medical community with assistance in the evaluation and management of patients with chronic pain. Unfortunately our resources are limited. Because we do not have an unlimited amount of time, or available appointments, we are required to closely monitor and manage their use. The following rules exist to maximize their use:  Patient's responsibilities: 1. Punctuality:  At what time should I arrive? You should be physically present in our office 30 minutes before your scheduled appointment. Your scheduled appointment is with your assigned healthcare provider. However, it takes 5-10 minutes to be "checked-in", and another 15 minutes for the nurses to do the admission. If you arrive to our office at the time you were given for your appointment, you will end up being at least 20-25 minutes late to your appointment with the provider. 2. Tardiness:  What happens if I arrive only a few minutes after my scheduled appointment time? You will need to reschedule your appointment. The cutoff is your appointment time. This is why it is so important that you arrive at least 30 minutes before that appointment. If you have an appointment scheduled for 10:00 AM and you arrive at 10:01, you will be required to reschedule your appointment.  3. Plan ahead:  Always assume that you will encounter traffic on your way in. Plan for it. If you are dependent on a driver, make sure they understand these rules and the need to arrive early. 4. Other appointments and responsibilities:  Avoid scheduling any other appointments before or after your pain clinic appointments.  5. Be prepared:  Write down everything that you need to discuss with your healthcare provider and give this information to the admitting nurse. Write down the medications that you will need  refilled. Bring your pills and bottles (even the empty ones), to all of your appointments, except for those where a procedure is scheduled. 6. No children or pets:  Find someone to take care of them. It is not appropriate to bring them in. 7. Scheduling changes:  We request "advanced notification" of any changes or cancellations. 8. Advanced notification:  Defined as a time period of more than 24 hours prior to the originally scheduled appointment. This allows for the appointment to be offered to other patients. 9. Rescheduling:  When a visit is rescheduled, it will require the cancellation of the original appointment. For this reason they both fall within the category of "Cancellations".  10. Cancellations:  They require advanced notification. Any cancellation less than 24 hours before the  appointment will be recorded as a "No Show". 11. No Show:  Defined as an unkept appointment where the patient failed to notify or declare to the practice their intention or inability to keep the appointment.  Corrective process for repeat offenders:  1. Tardiness: Three (3) episodes of rescheduling due to late arrivals will be recorded as one (1) "No Show". 2. Cancellation or reschedule: Three (3) cancellations or rescheduling will be recorded as one (1) "No Show". 3. "No Shows": Three (3) "No Shows" within a 12 month period will result in discharge from the practice. ____________________________________________________________________________________________   ______________________________________________________________________________________________  Specialty Pain Scale  Introduction:  There are significant differences in how pain is reported. The word pain usually refers to physical pain, but it is also a common synonym of suffering. The medical community uses a scale from 0 (zero) to 10 (ten) to report pain level. Zero (0) is described as "no pain",   while ten (10) is described as "the worse pain  you can imagine". The problem with this scale is that physical pain is reported along with suffering. Suffering refers to mental pain, or more often yet it refers to any unpleasant feeling, emotion or aversion associated with the perception of harm or threat of harm. It is the psychological component of pain.  Pain Specialists prefer to separate the two components. The pain scale used by this practice is the Verbal Numerical Rating Scale (VNRS-11). This scale is for the physical pain only. DO NOT INCLUDE how your pain psychologically affects you. This scale is for adults 21 years of age and older. It has 11 (eleven) levels. The 1st level is 0/10. This means: "right now, I have no pain". In the context of pain management, it also means: "right now, my physical pain is under control with the current therapy".  General Information:  The scale should reflect your current level of pain. Unless you are specifically asked for the level of your worst pain, or your average pain. If you are asked for one of these two, then it should be understood that it is over the past 24 hours.  Levels 1 (one) through 5 (five) are described below, and can be treated as an outpatient. Ambulatory pain management facilities such as ours are more than adequate to treat these levels. Levels 6 (six) through 10 (ten) are also described below, however, these must be treated as a hospitalized patient. While levels 6 (six) and 7 (seven) may be evaluated at an urgent care facility, levels 8 (eight) through 10 (ten) constitute medical emergencies and as such, they belong in a hospital's emergency department. When having these levels (as described below), do not come to our office. Our facility is not equipped to manage these levels. Go directly to an urgent care facility or an emergency department to be evaluated.  Definitions:  Activities of Daily Living (ADL): Activities of daily living (ADL or ADLs) is a term used in healthcare to refer to  people's daily self-care activities. Health professionals often use a person's ability or inability to perform ADLs as a measurement of their functional status, particularly in regard to people post injury, with disabilities and the elderly. There are two ADL levels: Basic and Instrumental. Basic Activities of Daily Living (BADL  or BADLs) consist of self-care tasks that include: Bathing and showering; personal hygiene and grooming (including brushing/combing/styling hair); dressing; Toilet hygiene (getting to the toilet, cleaning oneself, and getting back up); eating and self-feeding (not including cooking or chewing and swallowing); functional mobility, often referred to as "transferring", as measured by the ability to walk, get in and out of bed, and get into and out of a chair; the broader definition (moving from one place to another while performing activities) is useful for people with different physical abilities who are still able to get around independently. Basic ADLs include the things many people do when they get up in the morning and get ready to go out of the house: get out of bed, go to the toilet, bathe, dress, groom, and eat. On the average, loss of function typically follows a particular order. Hygiene is the first to go, followed by loss of toilet use and locomotion. The last to go is the ability to eat. When there is only one remaining area in which the person is independent, there is a 62.9% chance that it is eating and only a 3.5% chance that it is hygiene. Instrumental Activities   of Daily Living (IADL or IADLs) are not necessary for fundamental functioning, but they let an individual live independently in a community. IADL consist of tasks that include: cleaning and maintaining the house; home establishment and maintenance; care of others (including selecting and supervising caregivers); care of pets; child rearing; managing money; managing financials (investments, etc.); meal preparation  and cleanup; shopping for groceries and necessities; moving within the community; safety procedures and emergency responses; health management and maintenance (taking prescribed medications); and using the telephone or other form of communication.  Instructions:  Most patients tend to report their pain as a combination of two factors, their physical pain and their psychosocial pain. This last one is also known as "suffering" and it is reflection of how physical pain affects you socially and psychologically. From now on, report them separately.  From this point on, when asked to report your pain level, report only your physical pain. Use the following table for reference.  Pain Clinic Pain Levels (0-5/10)  Pain Level Score  Description  No Pain 0   Mild pain 1 Nagging, annoying, but does not interfere with basic activities of daily living (ADL). Patients are able to eat, bathe, get dressed, toileting (being able to get on and off the toilet and perform personal hygiene functions), transfer (move in and out of bed or a chair without assistance), and maintain continence (able to control bladder and bowel functions). Blood pressure and heart rate are unaffected. A normal heart rate for a healthy adult ranges from 60 to 100 bpm (beats per minute).   Mild to moderate pain 2 Noticeable and distracting. Impossible to hide from other people. More frequent flare-ups. Still possible to adapt and function close to normal. It can be very annoying and may have occasional stronger flare-ups. With discipline, patients may get used to it and adapt.   Moderate pain 3 Interferes significantly with activities of daily living (ADL). It becomes difficult to feed, bathe, get dressed, get on and off the toilet or to perform personal hygiene functions. Difficult to get in and out of bed or a chair without assistance. Very distracting. With effort, it can be ignored when deeply involved in activities.   Moderately severe pain  4 Impossible to ignore for more than a few minutes. With effort, patients may still be able to manage work or participate in some social activities. Very difficult to concentrate. Signs of autonomic nervous system discharge are evident: dilated pupils (mydriasis); mild sweating (diaphoresis); sleep interference. Heart rate becomes elevated (>115 bpm). Diastolic blood pressure (lower number) rises above 100 mmHg. Patients find relief in laying down and not moving.   Severe pain 5 Intense and extremely unpleasant. Associated with frowning face and frequent crying. Pain overwhelms the senses.  Ability to do any activity or maintain social relationships becomes significantly limited. Conversation becomes difficult. Pacing back and forth is common, as getting into a comfortable position is nearly impossible. Pain wakes you up from deep sleep. Physical signs will be obvious: pupillary dilation; increased sweating; goosebumps; brisk reflexes; cold, clammy hands and feet; nausea, vomiting or dry heaves; loss of appetite; significant sleep disturbance with inability to fall asleep or to remain asleep. When persistent, significant weight loss is observed due to the complete loss of appetite and sleep deprivation.  Blood pressure and heart rate becomes significantly elevated. Caution: If elevated blood pressure triggers a pounding headache associated with blurred vision, then the patient should immediately seek attention at an urgent or emergency care unit, as   these may be signs of an impending stroke.    Emergency Department Pain Levels (6-10/10)  Emergency Room Pain 6 Severely limiting. Requires emergency care and should not be seen or managed at an outpatient pain management facility. Communication becomes difficult and requires great effort. Assistance to reach the emergency department may be required. Facial flushing and profuse sweating along with potentially dangerous increases in heart rate and blood pressure  will be evident.   Distressing pain 7 Self-care is very difficult. Assistance is required to transport, or use restroom. Assistance to reach the emergency department will be required. Tasks requiring coordination, such as bathing and getting dressed become very difficult.   Disabling pain 8 Self-care is no longer possible. At this level, pain is disabling. The individual is unable to do even the most "basic" activities such as walking, eating, bathing, dressing, transferring to a bed, or toileting. Fine motor skills are lost. It is difficult to think clearly.   Incapacitating pain 9 Pain becomes incapacitating. Thought processing is no longer possible. Difficult to remember your own name. Control of movement and coordination are lost.   The worst pain imaginable 10 At this level, most patients pass out from pain. When this level is reached, collapse of the autonomic nervous system occurs, leading to a sudden drop in blood pressure and heart rate. This in turn results in a temporary and dramatic drop in blood flow to the brain, leading to a loss of consciousness. Fainting is one of the body's self defense mechanisms. Passing out puts the brain in a calmed state and causes it to shut down for a while, in order to begin the healing process.    Summary: 1. Refer to this scale when providing [place] with your pain level. 2. Be accurate and careful when reporting your pain level. This will help with your care. 3. Over-reporting your pain level will lead to loss of credibility. 4. Even a level of 1/10 means that there is pain and will be treated at our facility. 5. High, inaccurate reporting will be documented as "Symptom Exaggeration", leading to loss of credibility and suspicions of possible secondary gains such as obtaining more narcotics, or wanting to appear disabled, for fraudulent reasons. 6. Only pain levels of 5 or below will be seen at our facility. 7. Pain levels of 6 and above will be sent to the  Emergency Department and the appointment cancelled. ______________________________________________________________________________________________    BMI Assessment: Estimated body mass index is 38.96 kg/m as calculated from the following:   Height as of this encounter: 5\' 4"  (1.626 m).   Weight as of this encounter: 227 lb (103 kg).  BMI interpretation table: BMI level Category Range association with higher incidence of chronic pain  <18 kg/m2 Underweight   18.5-24.9 kg/m2 Ideal body weight   25-29.9 kg/m2 Overweight Increased incidence by 20%  30-34.9 kg/m2 Obese (Class I) Increased incidence by 68%  35-39.9 kg/m2 Severe obesity (Class II) Increased incidence by 136%  >40 kg/m2 Extreme obesity (Class III) Increased incidence by 254%   BMI Readings from Last 4 Encounters:  07/27/18 38.96 kg/m  05/18/18 41.92 kg/m  11/29/16 42.80 kg/m  11/16/16 47.55 kg/m   Wt Readings from Last 4 Encounters:  07/27/18 227 lb (103 kg)  05/18/18 229 lb 4.5 oz (104 kg)  11/29/16 234 lb (106.1 kg)  11/16/16 260 lb (117.9 kg)

## 2018-07-27 NOTE — Progress Notes (Signed)
Patient's Name: Rhonda Saunders  MRN: [id number]  Referring Provider: Glendon Axe, MD  DOB: 04/13/1932  PCP: Katheren Shams  DOS: 07/27/2018  Note by: Dionisio David NP  Service setting: Ambulatory outpatient  Specialty: Interventional Pain Management  Location: [facility] (AMB) Pain Management Facility    Patient type: New Patient    Primary Reason(s) for Visit: Initial Patient Evaluation CC: Back Pain  HPI  Rhonda Saunders is a 85 y.o. year old, female patient, who comes today for an initial evaluation. She has AKI (acute kidney injury) ([place]); Altered mental status; Palliative care by specialist; Goals of care, counseling/discussion; Pressure injury of skin; Atherosclerosis of native coronary artery of native heart; BMI 40.0-44.9, adult ([place]); Chronic diastolic CHF (congestive heart failure) ([place]); Chronic midline low back pain without sciatica; Essential hypertension; Hearing loss of right ear; Hx of aortic valve replacement, mechanical; Hyperlipidemia; Long term (current) use of anticoagulants; Moderate persistent asthma without complication; Pituitary tumor; Pure hypercholesterolemia; Senile dementia without behavioral disturbance ([place]); Type 2 diabetes, diet controlled ([place]); Chronic upper back pain (Primary Area of Pain) (R); Neurogenic pain (Secondary Area of Pain); Chronic pain of left lower extremity (Tertiary Area of Pain); Chronic pain syndrome; Long term current use of opiate analgesic; Pharmacologic therapy; Disorder of skeletal system; and Problems influencing health status on their problem list.. Her primarily concern today is the Back Pain  Pain Assessment: Location: Right, Mid Leg(left leg, right and middle back) Radiating: Pain radiaties down left leg Onset: More than a month ago Duration: Chronic pain Quality: Aching Severity: 8 /10 (subjective, self-reported pain score)  Note: Reported level is compatible with observation. Clinically the patient looks like a 1/10 A 1/10 is viewed as  "Mild" and described as nagging, annoying, but not interfering with basic activities of daily living (ADL). Rhonda Saunders is able to eat, bathe, get dressed, do toileting (being able to get on and off the toilet and perform personal hygiene functions), transfer (move in and out of bed or a chair without assistance), and maintain continence (able to control bladder and bowel functions). Physiologic parameters such as blood pressure and heart rate apear wnl. Information on the proper use of the pain scale provided to the patient today. When using our objective Pain Scale, levels between 6 and 10/10 are said to belong in an emergency room, as it progressively worsens from a 6/10, described as severely limiting, requiring emergency care not usually available at an outpatient pain management facility. At a 6/10 level, communication becomes difficult and requires great effort. Assistance to reach the emergency department may be required. Facial flushing and profuse sweating along with potentially dangerous increases in heart rate and blood pressure will be evident. Effect on ADL: not able to move when pain hits, limits daily activities Timing: Intermittent Modifying factors: medications BP: (!) 132/48  HR: 65  Onset and Duration: Date of onset: 07/22/14 and Present longer than 3 months Cause of pain: shingles Severity: No change since onset, NAS-11 at its worse: 8/10, NAS-11 at its best: 6/10, NAS-11 now: 6/10 and NAS-11 on the average: 7/10 Timing: Not influenced by the time of the day and can be sitting and pain will comes Aggravating Factors: not sure Alleviating Factors: Medications Associated Problems: Pain that wakes patient up Quality of Pain: Intermittent, Sharp and Shooting Previous Examinations or Tests: MRI scan Previous Treatments: Narcotic medications  The patient comes into the clinics today for the first time for a chronic pain management evaluation.  According to the patient her primary area  of  pain is in her right middle to lower back.  She admits that this pain is secondary to previous shingles outbreak 3 years ago.  She is currently on gabapentin and OxyContin.  She is currently being seen by [facility].  She denies any previous surgery, interventional therapy.  She is currently in home physical therapy for generalized weakness.  She denies any recent images.  Her second area pain is in her left leg.  He was in the hospital for heart failure exacerbation was sent to outpatient rehabilitation and suffered an injury to her left leg.  She is currently getting dressing changes with home health.  She denies any previous surgery or interventional therapy to her left leg.  She has had ultrasound of right leg in 2018.  Today I took the time to provide the patient with information regarding this pain practice. The patient was informed that the practice is divided into two sections: an interventional pain management section, as well as a completely separate and distinct medication management section. I explained that there are procedure days for interventional therapies, and evaluation days for follow-ups and medication management. Because of the amount of documentation required during both, they are kept separated. This means that there is the possibility that she may be scheduled for a procedure on one day, and medication management the next. I have also informed her that because of staffing and facility limitations, this practice will no longer take patients for medication management only. To illustrate the reasons for this, I gave the patient the example of surgeons, and how inappropriate it would be to refer a patient to his/her care, just to write for the post-surgical antibiotics on a surgery done by a different surgeon.   Because interventional pain management is part of the board-certified specialty for the doctors, the patient was informed that joining this practice means that  they are open to any and all interventional therapies. I made it clear that this does not mean that they will be forced to have any procedures done. What this means is that I believe interventional therapies to be essential part of the diagnosis and proper management of chronic pain conditions. Therefore, patients not interested in these interventional alternatives will be better served under the care of a different practitioner.  The patient was also made aware of my Comprehensive Pain Management Safety Guidelines where by joining this practice, they limit all of their nerve blocks and joint injections to those done by our practice, for as long as we are retained to manage their care. Historic Controlled Substance Pharmacotherapy Review  PMP and historical list of controlled substances: OxyContin ER 10 mg, ketamine HCl powder Highest opioid analgesic regimen found: OxyContin ER 10 mg 1 tablet twice daily (last fill date 1127 2019) oxycodone 20 mg/day Most recent opioid analgesic: OxyContin ER 10 mg 1 tablet twice daily (last fill date 1127 2019) oxycodone 20 mg/day Current opioid analgesics: OxyContin ER 10 mg 1 tablet twice daily (last fill date 1127 2019) oxycodone 20 mg/day Highest recorded MME/day: 30 mg/day MME/day: 30 mg/day Medications: The patient did not bring the medication(s) to the appointment, as requested in our "New Patient Package" Pharmacodynamics: Desired effects: Analgesia: The patient reports >50% benefit. Reported improvement in function: The patient reports medication allows her to accomplish basic ADLs. Clinically meaningful improvement in function (CMIF): Sustained CMIF goals met Perceived effectiveness: Described as relatively effective, allowing for increase in activities of daily living (ADL) Undesirable effects: Side-effects or  Adverse reactions: None reported Historical Monitoring: The patient  has no history on file for drug. List of all UDS Test(s): No results  found for: MDMA, COCAINSCRNUR, PCPSCRNUR, PCPQUANT, CANNABQUANT, THCU, [place] List of all Serum Drug Screening Test(s):  No results found for: AMPHSCRSER, BARBSCRSER, BENZOSCRSER, COCAINSCRSER, PCPSCRSER, PCPQUANT, THCSCRSER, CANNABQUANT, OPIATESCRSER, OXYSCRSER, PROPOXSCRSER Historical Background Evaluation: [place] PDMP: Six (6) year initial data search conducted.             [place] Department of public safety, offender search: Editor, commissioning Information) Non-contributory Risk Assessment Profile: Aberrant behavior: None observed or detected today Risk factors for fatal opioid overdose: None identified today Fatal overdose hazard ratio (HR): Calculation deferred Non-fatal overdose hazard ratio (HR): Calculation deferred Risk of opioid abuse or dependence: 0.7-3.0% with doses ? 36 MME/day and 6.1-26% with doses ? 120 MME/day. Substance use disorder (SUD) risk level: Pending results of Medical Psychology Evaluation for SUD Opioid risk tool (ORT) (Total Score): 0  ORT Scoring interpretation table:  Score <3 = Low Risk for SUD  Score between 4-7 = Moderate Risk for SUD  Score >8 = High Risk for Opioid Abuse   PHQ-2 Depression Scale:  Total score:    PHQ-2 Scoring interpretation table: (Score and probability of major depressive disorder)  Score 0 = No depression  Score 1 = 15.4% Probability  Score 2 = 21.1% Probability  Score 3 = 38.4% Probability  Score 4 = 45.5% Probability  Score 5 = 56.4% Probability  Score 6 = 78.6% Probability   PHQ-9 Depression Scale:  Total score:    PHQ-9 Scoring interpretation table:  Score 0-4 = No depression  Score 5-9 = Mild depression  Score 10-14 = Moderate depression  Score 15-19 = Moderately severe depression  Score 20-27 = Severe depression (2.4 times higher risk of SUD and 2.89 times higher risk of overuse)   Pharmacologic Plan: Pending ordered tests and/or consults  Meds  The patient has a current medication list which includes the following prescription(s):  atorvastatin, donepezil, fluticasone-salmeterol, furosemide, gabapentin, metoprolol succinate, nitroglycerin, nystatin, oxycodone, polyethylene glycol, potassium chloride sa, ramipril, tizanidine, warfarin, and albuterol.  Current Outpatient Medications on File Prior to Visit  Medication Sig  . atorvastatin (LIPITOR) 80 MG tablet Take 1 tablet by mouth daily.  Marland Kitchen donepezil (ARICEPT) 5 MG tablet Take 5 mg by mouth at bedtime.  . Fluticasone-Salmeterol (ADVAIR DISKUS) 250-50 MCG/DOSE AEPB INHALE 1 PUFF INTO THE LUNGS ONCE DAILY AS DIRECTED  . furosemide (LASIX) 40 MG tablet Take 80 mg by mouth 2 (two) times daily.  Marland Kitchen gabapentin (NEURONTIN) 600 MG tablet Take 0.5 tablets (300 mg total) by mouth 2 (two) times daily.  . metoprolol succinate (TOPROL-XL) 25 MG 24 hr tablet Take 1 tablet (25 mg total) by mouth daily.  . nitroGLYCERIN (NITROSTAT) 0.4 MG SL tablet Place 0.4 mg under the tongue every 5 (five) minutes as needed for chest pain.  Marland Kitchen nystatin (MYCOSTATIN) 100000 UNIT/ML suspension Take 5 mLs by mouth once.  Marland Kitchen oxyCODONE (OXYCONTIN) 10 mg 12 hr tablet Take 1 tablet (10 mg total) by mouth every 12 (twelve) hours.  . polyethylene glycol (MIRALAX / GLYCOLAX) packet Take 17 g by mouth daily as needed for mild constipation.  . potassium chloride SA (K-DUR,KLOR-CON) 20 MEQ tablet TAKE 1 TABLET BY MOUTH TWICE A DAY.  . ramipril (ALTACE) 10 MG capsule Take 10 mg by mouth daily.  Marland Kitchen tiZANidine (ZANAFLEX) 2 MG tablet Take 2 mg by mouth at bedtime.  Marland Kitchen warfarin (COUMADIN) 5 MG tablet  Take 0.5-1 tablets (2.5-5 mg total) by mouth one time only at 6 PM. Take 2.5 mg Monday, wed, Friday and 5 mg the remaining days, and need to adjust the dose per INR.  Marland Kitchen albuterol (PROVENTIL HFA;VENTOLIN HFA) 108 (90 Base) MCG/ACT inhaler Inhale 2 puffs into the lungs every 6 (six) hours as needed.   No current facility-administered medications on file prior to visit.    Imaging Review   Hip Imaging:  Hip-R DG 2-3 views:   Results for orders placed during the hospital encounter of 11/16/16  DG Hip Unilat W or Wo Pelvis 2-3 Views Right   Narrative CLINICAL DATA:  Pain after fall  EXAM: DG HIP (WITH OR WITHOUT PELVIS) 2-3V RIGHT  COMPARISON:  None.  FINDINGS: There is no evidence of hip fracture or dislocation. There is no evidence of arthropathy or other focal bone abnormality.  IMPRESSION: Negative.   Electronically Signed   By: Dorise Bullion III M.D   On: 11/16/2016 22:29   Note: Available results from prior imaging studies were reviewed.        ROS  Cardiovascular History: Heart surgery and Weak heart (CHF) Pulmonary or Respiratory History: Lung problems Neurological History: No reported neurological signs or symptoms such as seizures, abnormal skin sensations, urinary and/or fecal incontinence, being born with an abnormal open spine and/or a tethered spinal cord Review of Past Neurological Studies:  Results for orders placed or performed during the hospital encounter of 05/13/18  CT Head Wo Contrast   Narrative   CLINICAL DATA:  Altered level of consciousness, leg pain and swelling for 2 weeks  EXAM: CT HEAD WITHOUT CONTRAST  TECHNIQUE: Contiguous axial images were obtained from the base of the skull through the vertex without intravenous contrast.  COMPARISON:  05/11/2017  FINDINGS: Brain: Similar brain atrophy pattern and chronic white matter microvascular changes throughout both cerebral hemispheres. No acute hemorrhage, definite new infarction, mass lesion, midline shift, herniation, hydrocephalus or extra-axial fluid collection. No focal mass effect or edema. Cisterns are patent. Cerebellar atrophy as well. 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  Vascular: Intracranial atherosclerosis.  No hyperdense vessel.  Skull: No acute osseous finding or fracture.  Sinuses/Orbits: No acute finding.  Other:  None.  IMPRESSION: Stable atrophy and chronic white matter microvascular changes  No acute intracranial abnormality by noncontrast CT.  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   Electronically Signed   By: Jerilynn Mages.  Shick M.D.   On: 05/13/2018 19:47   Results for orders placed or performed during the hospital encounter of 03/30/18  MR BRAIN W WO CONTRAST   Narrative   CLINICAL DATA:  Follow-up pituitary tumor seen on previous CT.  EXAM: MRI HEAD WITHOUT AND WITH CONTRAST  TECHNIQUE: Multiplanar, multiecho pulse sequences of the brain and surrounding structures were obtained without and with intravenous contrast.  CONTRAST:  7m MULTIHANCE GADOBENATE DIMEGLUMINE 529 MG/ML IV SOLN  COMPARISON:  07/11/2017  FINDINGS: Brain: Diffusion imaging does not show any acute or subacute infarction. There is age related cerebral atrophy with moderate chronic small-vessel ischemic changes of the deep and subcortical white matter. No cortical or large vessel territory infarction. No intra-axial mass lesion, recent hemorrhage, hydrocephalus or extra-axial collection. Small foci of hemosiderin deposition on are seen associated with some of the old small vessel infarctions.  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 Cephalo caudal measurement is 2.4 cm. The tumor causes up lifting of the optic chiasm.  The tumor shows bilateral cavernous sinus invasion, much more extensive on the right than the left. Tumor completely in capsulated the cavernous carotid on the right and surrounds greater than 50% of the cavernous carotid on the left. The tumor does not show internal necrosis or hemorrhage.  Vascular: Major vessels at the base of the brain show flow.  Skull and upper cervical spine: Otherwise negative  Sinuses/Orbits: Clear/normal  Other: None  IMPRESSION: No acute brain finding. Moderate age related atrophy and  chronic small-vessel ischemic changes of the cerebral hemispheric white matter.  Pituitary macro adenoma with suprasellar extension. Maximal dimension 2.4 cm. Up lifting of the optic chiasm. Bilateral cavernous sinus invasion, much more pronounced on the right than the left, with carotid encasement as above.   Electronically Signed   By: Nelson Chimes M.D.   On: 03/30/2018 12:36    Psychological-Psychiatric History: Difficulty sleeping and or falling asleep Gastrointestinal History: No reported gastrointestinal signs or symptoms such as vomiting or evacuating blood, reflux, heartburn, alternating episodes of diarrhea and constipation, inflamed or scarred liver, or pancreas or irrregular and/or infrequent bowel movements Genitourinary History: Difficulty emptying the bladder or controlling the flow of urine (Neurogenic bladder) Hematological History: Brusing easily and Bleeding easily Endocrine History: High blood sugar requiring insulin (IDDM) Rheumatologic History: No reported rheumatological signs and symptoms such as fatigue, joint pain, tenderness, swelling, redness, heat, stiffness, decreased range of motion, with or without associated rash Musculoskeletal History: Negative for myasthenia gravis, muscular dystrophy, multiple sclerosis or malignant hyperthermia Work History: Retired  Allergies  Ms. Plessinger is allergic to pentazocine lactate [pentazocine].  Laboratory Chemistry  Inflammation Markers No results found for: CRP, ESRSEDRATE (CRP: Acute Phase) (ESR: Chronic Phase) Renal Function Markers Lab Results  Component Value Date   BUN 27 (H) 05/19/2018   CREATININE 1.04 (H) 05/19/2018   GFRAA 56 (L) 05/19/2018   GFRNONAA 48 (L) 05/19/2018   Hepatic Function Markers Lab Results  Component Value Date   AST 18 05/13/2018   ALT 17 05/13/2018   ALBUMIN 3.2 (L) 05/13/2018   ALKPHOS 59 05/13/2018   Electrolytes Lab Results  Component Value Date   NA 143 05/19/2018   K  4.1 05/19/2018   CL 110 05/19/2018   CALCIUM 8.0 (L) 05/19/2018   MG 2.5 (H) 05/15/2018   Neuropathy Markers No results found for: [id number] Bone Pathology Markers Lab Results  Component Value Date   ALKPHOS 59 05/13/2018   CALCIUM 8.0 (L) 05/19/2018   Coagulation Parameters Lab Results  Component Value Date   INR 3.94 05/19/2018   LABPROT 37.9 (H) 05/19/2018   PLT 161 05/19/2018   Cardiovascular Markers Lab Results  Component Value Date   BNP 100.0 05/13/2018   HGB 9.4 (L) 05/19/2018   HCT 30.6 (L) 05/19/2018   Note: Lab results reviewed.  PFSH  Drug: Ms. Brion  has no history on file for drug. Alcohol:  reports no history of alcohol use. Tobacco:  reports that she has never smoked. She has never used smokeless tobacco. Medical:  has a past medical history of CHF (congestive heart failure) ([place]), COPD (chronic obstructive pulmonary disease) ([place]), Coronary artery disease, Dementia ([place]), Diabetes mellitus without complication ([place]), Hyperlipidemia, and Hypertension. Family: family history is not on file.  Past Surgical History:  Procedure Laterality Date  . AORTIC VALVE REPLACEMENT (AVR)/CORONARY ARTERY BYPASS GRAFTING (CABG)    . TONSILLECTOMY     Active Ambulatory Problems    Diagnosis Date Noted  . AKI (acute kidney injury) ([place])  05/13/2018  . Altered mental status   . Palliative care by specialist   . Goals of care, counseling/discussion   . Pressure injury of skin 05/16/2018  . Atherosclerosis of native coronary artery of native heart 11/26/2011  . BMI 40.0-44.9, adult ([place]) 01/04/2016  . Chronic diastolic CHF (congestive heart failure) ([place]) 04/21/2014  . Chronic midline low back pain without sciatica 10/06/2015  . Essential hypertension 11/26/2011  . Hearing loss of right ear 11/26/2011  . Hx of aortic valve replacement, mechanical 11/26/2011  . Hyperlipidemia 11/26/2011  . Long term (current) use of anticoagulants 04/22/2012  . Moderate persistent  asthma without complication 62/22/9798  . Pituitary tumor 02/20/2018  . Pure hypercholesterolemia 12/17/2016  . Senile dementia without behavioral disturbance ([place]) 01/29/2018  . Type 2 diabetes, diet controlled ([place]) 10/06/2015  . Chronic upper back pain (Primary Area of Pain) ([place]) 07/27/2018  . Neurogenic pain (Secondary Area of Pain) 07/27/2018  . Chronic pain of left lower extremity (Tertiary Area of Pain) 07/27/2018  . Chronic pain syndrome 07/27/2018  . Long term current use of opiate analgesic 07/27/2018  . Pharmacologic therapy 07/27/2018  . Disorder of skeletal system 07/27/2018  . Problems influencing health status 07/27/2018   Resolved Ambulatory Problems    Diagnosis Date Noted  . No Resolved Ambulatory Problems   Past Medical History:  Diagnosis Date  . CHF (congestive heart failure) ([place])   . COPD (chronic obstructive pulmonary disease) ([place])   . Coronary artery disease   . Dementia ([place])   . Diabetes mellitus without complication ([place])   . Hypertension    Constitutional Exam  General appearance: Well nourished, well developed, and well hydrated. In no apparent acute distress Vitals:   07/27/18 1022  BP: (!) 132/48  Pulse: 65  Temp: 98 F (36.7 C)  SpO2: 98%  Weight: 227 lb (103 kg)  Height: '5\' 4"'$  (1.626 m)   BMI Assessment: Estimated body mass index is 38.96 kg/m as calculated from the following:   Height as of this encounter: '5\' 4"'$  (1.626 m).   Weight as of this encounter: 227 lb (103 kg).  BMI interpretation table: BMI level Category Range association with higher incidence of chronic pain  <18 kg/m2 Underweight   18.5-24.9 kg/m2 Ideal body weight   25-29.9 kg/m2 Overweight Increased incidence by 20%  30-34.9 kg/m2 Obese (Class I) Increased incidence by 68%  35-39.9 kg/m2 Severe obesity (Class II) Increased incidence by 136%  >40 kg/m2 Extreme obesity (Class III) Increased incidence by 254%   BMI Readings from Last 4 Encounters:  07/27/18 38.96  kg/m  05/18/18 41.92 kg/m  11/29/16 42.80 kg/m  11/16/16 47.55 kg/m   Wt Readings from Last 4 Encounters:  07/27/18 227 lb (103 kg)  05/18/18 229 lb 4.5 oz (104 kg)  11/29/16 234 lb (106.1 kg)  11/16/16 260 lb (117.9 kg)  Psych/Mental status: Alert, oriented x 3 (person, place, & time)       Eyes: PERLA Respiratory: No evidence of acute respiratory distress  Cervical Spine Exam  Inspection: No masses, redness, or swelling Alignment: Symmetrical Functional ROM: Unrestricted ROM      Stability: No instability detected Muscle strength & Tone: Functionally intact Sensory: Unimpaired Palpation: No palpable anomalies              Upper Extremity (UE) Exam    Side: Right upper extremity  Side: Left upper extremity  Inspection: No masses, redness, swelling, or asymmetry. No contractures  Inspection: No masses, redness, swelling, or asymmetry. No  contractures  Functional ROM: Unrestricted ROM          Functional ROM: Unrestricted ROM          Muscle strength & Tone: Functionally intact  Muscle strength & Tone: Functionally intact  Sensory: Unimpaired  Sensory: Unimpaired  Palpation: No palpable anomalies              Palpation: No palpable anomalies              Specialized Test(s): Deferred         Specialized Test(s): Deferred          Thoracic Spine Exam  Inspection: No masses, redness, or swelling Alignment: Symmetrical Functional ROM: Unrestricted ROM Stability: No instability detected Sensory: Neuropathic pain pattern Muscle strength & Tone: Complains of area being tender to palpation  Lumbar Spine Exam  Inspection: No masses, redness, or swelling Alignment: Symmetrical Functional ROM: Unrestricted ROM      Stability: No instability detected Muscle strength & Tone: Functionally intact Sensory: Unimpaired Palpation: No palpable anomalies       Provocative Tests: Lumbar Hyperextension and rotation test: evaluation deferred today       Patrick's Maneuver: evaluation  deferred today                    Gait & Posture Assessment  Ambulation: Patient ambulates using a wheel chair Gait: Limited. Using assistive device to ambulate Posture: WNL   Lower Extremity Exam    Side: Right lower extremity  Side: Left lower extremity  Inspection: No masses, redness, swelling, or asymmetry. No contractures  Inspection: Venous stasis edema Guaze dressing in place, skin dicoloration  Functional ROM: Unrestricted ROM          Functional ROM: Unrestricted ROM          Muscle strength & Tone: Functionally intact  Muscle strength & Tone: Mild-to-moderate deconditioning  Sensory: Unimpaired  Sensory: Unimpaired  Palpation: No palpable anomalies  Palpation: Complains of area being tender to palpation   Assessment  Primary Diagnosis & Pertinent Problem List: The primary encounter diagnosis was Localized edema. Diagnoses of Chronic upper back pain (Primary Area of Pain) (R), Neurogenic pain (Secondary Area of Pain), Chronic pain of left lower extremity (Tertiary Area of Pain), Chronic pain syndrome, Long term current use of opiate analgesic, Pharmacologic therapy, Disorder of skeletal system, and Problems influencing health status were also pertinent to this visit.  Visit Diagnosis: 1. Localized edema   2. Chronic upper back pain (Primary Area of Pain) (R)   3. Neurogenic pain (Secondary Area of Pain)   4. Chronic pain of left lower extremity (Tertiary Area of Pain)   5. Chronic pain syndrome   6. Long term current use of opiate analgesic   7. Pharmacologic therapy   8. Disorder of skeletal system   9. Problems influencing health status    Plan of Care  Initial treatment plan:  Please be advised that as per protocol, today's visit has been an evaluation only. We have not taken over the patient's controlled substance management.  Problem-specific plan: No problem-specific Assessment & Plan notes found for this encounter.  Ordered Lab-work, Procedure(s), Referral(s), &  Consult(s): Orders Placed This Encounter  Procedures  . Compliance Drug Analysis, Ur  . Comp. Metabolic Panel (12)  . Magnesium  . Vitamin B12  . Sedimentation rate  . 25-Hydroxyvitamin D Lcms D2+D3  . C-reactive protein   Pharmacotherapy: Medications ordered:  No orders of the defined types were placed in  this encounter.  Medications administered during this visit: [name] had no medications administered during this visit.   Pharmacotherapy under consideration:  Opioid Analgesics: The patient was informed that there is no guarantee that she would be a candidate for opioid analgesics. The decision will be made following CDC guidelines. This decision will be based on the results of diagnostic studies, as well as Ms. Silsby's risk profile.  Membrane stabilizer: To be determined at a later time Muscle relaxant: To be determined at a later time NSAID: To be determined at a later time Other analgesic(s): To be determined at a later time   Interventional therapies under consideration: Ms. Rix was informed that there is no guarantee that she would be a candidate for interventional therapies. The decision will be based on the results of diagnostic studies, as well as Ms. Hudson's risk profile.  Possible procedure(s): Under consideration   Provider-requested follow-up: Return for 2nd Visit, w/ Dr. Dossie Arbour.  Future Appointments  Date Time Provider [place]  08/10/2018 10:45 AM Milinda Pointer, MD [facility] None    Primary Care Physician: Katheren Shams Location: [facility] Note by:  Date: 07/27/2018; Time: 1:14 PM  Pain Score Disclaimer: We use the NRS-11 scale. This is a self-reported, subjective measurement of pain severity with only modest accuracy. It is used primarily to identify changes within a particular patient. It must be understood that outpatient pain scales are significantly less accurate that those used for research,  where they can be applied under ideal controlled circumstances with minimal exposure to variables. In reality, the score is likely to be a combination of pain intensity and pain affect, where pain affect describes the degree of emotional arousal or changes in action readiness caused by the sensory experience of pain. Factors such as social and work situation, setting, emotional state, anxiety levels, expectation, and prior pain experience may influence pain perception and show large inter-individual differences that may also be affected by time variables.  Patient instructions provided during this appointment: Patient Instructions   ____________________________________________________________________________________________  Appointment Policy Summary  It is our goal and responsibility to provide the medical community with assistance in the evaluation and management of patients with chronic pain. Unfortunately our resources are limited. Because we do not have an unlimited amount of time, or available appointments, we are required to closely monitor and manage their use. The following rules exist to maximize their use:  Patient's responsibilities: 1. Punctuality:  At what time should I arrive? You should be physically present in our office 30 minutes before your scheduled appointment. Your scheduled appointment is with your assigned healthcare provider. However, it takes 5-10 minutes to be "checked-in", and another 15 minutes for the nurses to do the admission. If you arrive to our office at the time you were given for your appointment, you will end up being at least 20-25 minutes late to your appointment with the provider. 2. Tardiness:  What happens if I arrive only a few minutes after my scheduled appointment time? You will need to reschedule your appointment. The cutoff is your appointment time. This is why it is so important that you arrive at least 30 minutes before that appointment. If you have  an appointment scheduled for 10:00 AM and you arrive at 10:01, you will be required to reschedule your appointment.  3. Plan ahead:  Always assume that you will encounter traffic on your way in. Plan for it. If you are dependent on a driver, make sure they  understand these rules and the need to arrive early. 4. Other appointments and responsibilities:  Avoid scheduling any other appointments before or after your pain clinic appointments.  5. Be prepared:  Write down everything that you need to discuss with your healthcare provider and give this information to the admitting nurse. Write down the medications that you will need refilled. Bring your pills and bottles (even the empty ones), to all of your appointments, except for those where a procedure is scheduled. 6. No children or pets:  Find someone to take care of them. It is not appropriate to bring them in. 7. Scheduling changes:  We request "advanced notification" of any changes or cancellations. 8. Advanced notification:  Defined as a time period of more than 24 hours prior to the originally scheduled appointment. This allows for the appointment to be offered to other patients. 9. Rescheduling:  When a visit is rescheduled, it will require the cancellation of the original appointment. For this reason they both fall within the category of "Cancellations".  10. Cancellations:  They require advanced notification. Any cancellation less than 24 hours before the  appointment will be recorded as a "No Show". 11. No Show:  Defined as an unkept appointment where the patient failed to notify or declare to the practice their intention or inability to keep the appointment.  Corrective process for repeat offenders:  1. Tardiness: Three (3) episodes of rescheduling due to late arrivals will be recorded as one (1) "No Show". 2. Cancellation or reschedule: Three (3) cancellations or rescheduling will be recorded as one (1) "No Show". 3. "No Shows": Three  (3) "No Shows" within a 12 month period will result in discharge from the practice. ____________________________________________________________________________________________   ______________________________________________________________________________________________  Specialty Pain Scale  Introduction:  There are significant differences in how pain is reported. The word pain usually refers to physical pain, but it is also a common synonym of suffering. The medical community uses a scale from 0 (zero) to 10 (ten) to report pain level. Zero (0) is described as "no pain", while ten (10) is described as "the worse pain you can imagine". The problem with this scale is that physical pain is reported along with suffering. Suffering refers to mental pain, or more often yet it refers to any unpleasant feeling, emotion or aversion associated with the perception of harm or threat of harm. It is the psychological component of pain.  Pain Specialists prefer to separate the two components. The pain scale used by this practice is the Verbal Numerical Rating Scale (VNRS-11). This scale is for the physical pain only. DO NOT INCLUDE how your pain psychologically affects you. This scale is for adults 38 years of age and older. It has 11 (eleven) levels. The 1st level is 0/10. This means: "right now, I have no pain". In the context of pain management, it also means: "right now, my physical pain is under control with the current therapy".  General Information:  The scale should reflect your current level of pain. Unless you are specifically asked for the level of your worst pain, or your average pain. If you are asked for one of these two, then it should be understood that it is over the past 24 hours.  Levels 1 (one) through 5 (five) are described below, and can be treated as an outpatient. Ambulatory pain management facilities such as ours are more than adequate to treat these levels. Levels 6 (six) through 10  (ten) are also described below, however, these must  be treated as a hospitalized patient. While levels 6 (six) and 7 (seven) may be evaluated at an urgent care facility, levels 8 (eight) through 10 (ten) constitute medical emergencies and as such, they belong in a hospital's emergency department. When having these levels (as described below), do not come to our office. Our facility is not equipped to manage these levels. Go directly to an urgent care facility or an emergency department to be evaluated.  Definitions:  Activities of Daily Living (ADL): Activities of daily living (ADL or ADLs) is a term used in healthcare to refer to people's daily self-care activities. Health professionals often use a person's ability or inability to perform ADLs as a measurement of their functional status, particularly in regard to people post injury, with disabilities and the elderly. There are two ADL levels: Basic and Instrumental. Basic Activities of Daily Living (BADL  or BADLs) consist of self-care tasks that include: Bathing and showering; personal hygiene and grooming (including brushing/combing/styling hair); dressing; Toilet hygiene (getting to the toilet, cleaning oneself, and getting back up); eating and self-feeding (not including cooking or chewing and swallowing); functional mobility, often referred to as "transferring", as measured by the ability to walk, get in and out of bed, and get into and out of a chair; the broader definition (moving from one place to another while performing activities) is useful for people with different physical abilities who are still able to get around independently. Basic ADLs include the things many people do when they get up in the morning and get ready to go out of the house: get out of bed, go to the toilet, bathe, dress, groom, and eat. On the average, loss of function typically follows a particular order. Hygiene is the first to go, followed by loss of toilet use and  locomotion. The last to go is the ability to eat. When there is only one remaining area in which the person is independent, there is a 62.9% chance that it is eating and only a 3.5% chance that it is hygiene. Instrumental Activities of Daily Living (IADL or IADLs) are not necessary for fundamental functioning, but they let an individual live independently in a community. IADL consist of tasks that include: cleaning and maintaining the house; home establishment and maintenance; care of others (including selecting and supervising caregivers); care of pets; child rearing; managing money; managing financials (investments, etc.); meal preparation and cleanup; shopping for groceries and necessities; moving within the community; safety procedures and emergency responses; health management and maintenance (taking prescribed medications); and using the telephone or other form of communication.  Instructions:  Most patients tend to report their pain as a combination of two factors, their physical pain and their psychosocial pain. This last one is also known as "suffering" and it is reflection of how physical pain affects you socially and psychologically. From now on, report them separately.  From this point on, when asked to report your pain level, report only your physical pain. Use the following table for reference.  Pain Clinic Pain Levels (0-5/10)  Pain Level Score  Description  No Pain 0   Mild pain 1 Nagging, annoying, but does not interfere with basic activities of daily living (ADL). Patients are able to eat, bathe, get dressed, toileting (being able to get on and off the toilet and perform personal hygiene functions), transfer (move in and out of bed or a chair without assistance), and maintain continence (able to control bladder and bowel functions). Blood pressure and heart rate are  unaffected. A normal heart rate for a healthy adult ranges from 60 to 100 bpm (beats per minute).   Mild to moderate pain  2 Noticeable and distracting. Impossible to hide from other people. More frequent flare-ups. Still possible to adapt and function close to normal. It can be very annoying and may have occasional stronger flare-ups. With discipline, patients may get used to it and adapt.   Moderate pain 3 Interferes significantly with activities of daily living (ADL). It becomes difficult to feed, bathe, get dressed, get on and off the toilet or to perform personal hygiene functions. Difficult to get in and out of bed or a chair without assistance. Very distracting. With effort, it can be ignored when deeply involved in activities.   Moderately severe pain 4 Impossible to ignore for more than a few minutes. With effort, patients may still be able to manage work or participate in some social activities. Very difficult to concentrate. Signs of autonomic nervous system discharge are evident: dilated pupils (mydriasis); mild sweating (diaphoresis); sleep interference. Heart rate becomes elevated (>115 bpm). Diastolic blood pressure (lower number) rises above 100 mmHg. Patients find relief in laying down and not moving.   Severe pain 5 Intense and extremely unpleasant. Associated with frowning face and frequent crying. Pain overwhelms the senses.  Ability to do any activity or maintain social relationships becomes significantly limited. Conversation becomes difficult. Pacing back and forth is common, as getting into a comfortable position is nearly impossible. Pain wakes you up from deep sleep. Physical signs will be obvious: pupillary dilation; increased sweating; goosebumps; brisk reflexes; cold, clammy hands and feet; nausea, vomiting or dry heaves; loss of appetite; significant sleep disturbance with inability to fall asleep or to remain asleep. When persistent, significant weight loss is observed due to the complete loss of appetite and sleep deprivation.  Blood pressure and heart rate becomes significantly elevated. Caution:  If elevated blood pressure triggers a pounding headache associated with blurred vision, then the patient should immediately seek attention at an urgent or emergency care unit, as these may be signs of an impending stroke.    Emergency Department Pain Levels (6-10/10)  Emergency Room Pain 6 Severely limiting. Requires emergency care and should not be seen or managed at an outpatient pain management facility. Communication becomes difficult and requires great effort. Assistance to reach the emergency department may be required. Facial flushing and profuse sweating along with potentially dangerous increases in heart rate and blood pressure will be evident.   Distressing pain 7 Self-care is very difficult. Assistance is required to transport, or use restroom. Assistance to reach the emergency department will be required. Tasks requiring coordination, such as bathing and getting dressed become very difficult.   Disabling pain 8 Self-care is no longer possible. At this level, pain is disabling. The individual is unable to do even the most "basic" activities such as walking, eating, bathing, dressing, transferring to a bed, or toileting. Fine motor skills are lost. It is difficult to think clearly.   Incapacitating pain 9 Pain becomes incapacitating. Thought processing is no longer possible. Difficult to remember your own name. Control of movement and coordination are lost.   The worst pain imaginable 10 At this level, most patients pass out from pain. When this level is reached, collapse of the autonomic nervous system occurs, leading to a sudden drop in blood pressure and heart rate. This in turn results in a temporary and dramatic drop in blood flow to the brain, leading to a loss  of consciousness. Fainting is one of the body's self defense mechanisms. Passing out puts the brain in a calmed state and causes it to shut down for a while, in order to begin the healing process.    Summary: 1. Refer to this  scale when providing [place] with your pain level. 2. Be accurate and careful when reporting your pain level. This will help with your care. 3. Over-reporting your pain level will lead to loss of credibility. 4. Even a level of 1/10 means that there is pain and will be treated at our facility. 5. High, inaccurate reporting will be documented as "Symptom Exaggeration", leading to loss of credibility and suspicions of possible secondary gains such as obtaining more narcotics, or wanting to appear disabled, for fraudulent reasons. 6. Only pain levels of 5 or below will be seen at our facility. 7. Pain levels of 6 and above will be sent to the Emergency Department and the appointment cancelled. ______________________________________________________________________________________________    BMI Assessment: Estimated body mass index is 38.96 kg/m as calculated from the following:   Height as of this encounter: '5\' 4"'$  (1.626 m).   Weight as of this encounter: 227 lb (103 kg).  BMI interpretation table: BMI level Category Range association with higher incidence of chronic pain  <18 kg/m2 Underweight   18.5-24.9 kg/m2 Ideal body weight   25-29.9 kg/m2 Overweight Increased incidence by 20%  30-34.9 kg/m2 Obese (Class I) Increased incidence by 68%  35-39.9 kg/m2 Severe obesity (Class II) Increased incidence by 136%  >40 kg/m2 Extreme obesity (Class III) Increased incidence by 254%   BMI Readings from Last 4 Encounters:  07/27/18 38.96 kg/m  05/18/18 41.92 kg/m  11/29/16 42.80 kg/m  11/16/16 47.55 kg/m   Wt Readings from Last 4 Encounters:  07/27/18 227 lb (103 kg)  05/18/18 229 lb 4.5 oz (104 kg)  11/29/16 234 lb (106.1 kg)  11/16/16 260 lb (117.9 kg)

## 2018-07-30 LAB — COMPLIANCE DRUG ANALYSIS, UR

## 2018-07-31 LAB — COMP. METABOLIC PANEL (12)
AST: 192 IU/L — ABNORMAL HIGH (ref 0–40)
Albumin/Globulin Ratio: 0.9 — ABNORMAL LOW (ref 1.2–2.2)
Albumin: 3.6 g/dL (ref 3.5–4.7)
Alkaline Phosphatase: 102 IU/L (ref 39–117)
BUN/Creatinine Ratio: 14 (ref 12–28)
BUN: 13 mg/dL (ref 8–27)
Bilirubin Total: 0.6 mg/dL (ref 0.0–1.2)
CREATININE: 0.9 mg/dL (ref 0.57–1.00)
Calcium: 8.8 mg/dL (ref 8.7–10.3)
Chloride: 107 mmol/L — ABNORMAL HIGH (ref 96–106)
GFR calc Af Amer: 68 mL/min/{1.73_m2} (ref >59)
GFR calc non Af Amer: 59 mL/min/{1.73_m2} — ABNORMAL LOW (ref >59)
Globulin, Total: 3.9 g/dL (ref 1.5–4.5)
Glucose: 127 mg/dL — ABNORMAL HIGH (ref 65–99)
Potassium: 4.5 mmol/L (ref 3.5–5.2)
Sodium: 142 mmol/L (ref 134–144)
Total Protein: 7.5 g/dL (ref 6.0–8.5)

## 2018-07-31 LAB — 25-HYDROXYVITAMIN D LCMS D2+D3: 25-HYDROXY, VITAMIN D: 11 ng/mL — AB

## 2018-07-31 LAB — 25-HYDROXY VITAMIN D LCMS D2+D3
25-Hydroxy, Vitamin D-2: <1 ng/mL
25-Hydroxy, Vitamin D-3: 11 ng/mL

## 2018-07-31 LAB — MAGNESIUM: Magnesium: 2.2 mg/dL (ref 1.6–2.3)

## 2018-07-31 LAB — SEDIMENTATION RATE: SED RATE: 87 mm/h — AB (ref 0–40)

## 2018-07-31 LAB — VITAMIN B12: Vitamin B-12: 947 pg/mL (ref 232–1245)

## 2018-07-31 LAB — C-REACTIVE PROTEIN: CRP: 22 mg/L — ABNORMAL HIGH (ref 0–10)

## 2018-08-03 ENCOUNTER — Encounter: Payer: Self-pay | Admitting: Nurse Practitioner

## 2018-08-03 DIAGNOSIS — R700 Elevated erythrocyte sedimentation rate: Secondary | ICD-10-CM | POA: Insufficient documentation

## 2018-08-03 DIAGNOSIS — R7982 Elevated C-reactive protein (CRP): Secondary | ICD-10-CM | POA: Insufficient documentation

## 2018-08-06 DIAGNOSIS — M546 Pain in thoracic spine: Secondary | ICD-10-CM

## 2018-08-06 DIAGNOSIS — G8929 Other chronic pain: Secondary | ICD-10-CM | POA: Insufficient documentation

## 2018-08-06 DIAGNOSIS — M5414 Radiculopathy, thoracic region: Secondary | ICD-10-CM | POA: Insufficient documentation

## 2018-08-06 DIAGNOSIS — B0229 Other postherpetic nervous system involvement: Secondary | ICD-10-CM | POA: Insufficient documentation

## 2018-08-06 NOTE — Progress Notes (Deleted)
Patient's Name: Rhonda Saunders  MRN: [id number]  Referring Provider: Katheren Shams  DOB: 15-Jun-1932  PCP: Katheren Shams  DOS: 08/10/2018  Note by: Gaspar Cola, MD  Service setting: Ambulatory outpatient  Specialty: Interventional Pain Management  Location: [facility] (AMB) Pain Management Facility    Patient type: Established   Primary Reason(s) for Visit: Encounter for evaluation before starting new chronic pain management plan of care (Level of risk: moderate) CC: No chief complaint on file.  HPI  Rhonda Saunders is a 85 y.o. year old, female patient, who comes today for a follow-up evaluation to review the test results and decide on a treatment plan. She has AKI (acute kidney injury) ([place]); Altered mental status; Palliative care by specialist; Goals of care, counseling/discussion; Pressure injury of skin; Atherosclerosis of native coronary artery of native heart; BMI 40.0-44.9, adult ([place]); Chronic diastolic CHF (congestive heart failure) ([place]); Chronic low back pain (Bilateral) (R>L) w/o sciatica; Essential hypertension; Hearing loss of right ear; Hx of aortic valve replacement, mechanical; Hyperlipidemia; Long term (current) use of anticoagulants ([place]); Moderate persistent asthma without complication; Pituitary tumor; Pure hypercholesterolemia; Senile dementia without behavioral disturbance ([place]); Type 2 diabetes, diet controlled ([place]); Chronic upper back pain (Primary Area of Pain) (Right); Neurogenic pain; Chronic lower extremity pain (Tertiary Area of Pain) (Left); Chronic pain syndrome; Long term current use of opiate analgesic; Pharmacologic therapy; Disorder of skeletal system; Problems influencing health status; Elevated C-reactive protein (CRP); Elevated sed rate; Chronic thoracic back pain (Right); Postherpetic neuralgia (Thoracic) (Right); Thoracic radicular pain (Right); and Thoracic radiculitis (Right) on their problem list. Her primarily concern today is the No chief  complaint on file.  Pain Assessment: Location:     Radiating:   Onset:   Duration:   Quality:   Severity:  /10 (subjective, self-reported pain score)  Note: Reported level is compatible with observation.                         When using our objective Pain Scale, levels between 6 and 10/10 are said to belong in an emergency room, as it progressively worsens from a 6/10, described as severely limiting, requiring emergency care not usually available at an outpatient pain management facility. At a 6/10 level, communication becomes difficult and requires great effort. Assistance to reach the emergency department may be required. Facial flushing and profuse sweating along with potentially dangerous increases in heart rate and blood pressure will be evident. Effect on ADL:   Timing:   Modifying factors:   BP:    HR:    Rhonda Saunders comes in today for a follow-up visit after her initial evaluation on 07/27/2018. Today we went over the results of her tests. These were explained in "Layman's terms". During today's appointment we went over my diagnostic impression, as well as the proposed treatment plan.  According to the patient her primary area of pain(B)(ML-R>L) is in her right middle to lower back.  She admits that this pain is secondary to previous shingles outbreak 3 years ago.  She is currently on gabapentin and OxyContin.  She is currently being seen by [facility].  She denies any previous surgery, interventional therapy.  She is currently in home physical therapy for generalized weakness.  She denies any recent images.  Her second area pain(L) is in her left leg.  He was in the hospital for heart failure exacerbation was sent to outpatient rehabilitation and suffered an injury to her left leg.  She is currently getting dressing changes with home health.  She denies any previous surgery or interventional therapy to her left leg.  She has had ultrasound of right leg in 2018.  In  considering the treatment plan options, [name] was reminded that I no longer take patients for medication management only. I asked her to let me know if she had no intention of taking advantage of the interventional therapies, so that we could make arrangements to provide this space to someone interested. I also made it clear that undergoing interventional therapies for the purpose of getting pain medications is very inappropriate on the part of a patient, and it will not be tolerated in this practice. This type of behavior would suggest true addiction and therefore it requires referral to an addiction specialist.   Further details on both, my assessment(s), as well as the proposed treatment plan, please see below.  Controlled Substance Pharmacotherapy Assessment REMS (Risk Evaluation and Mitigation Strategy)  Analgesic: OxyContin ER 10 mg 1 tablet twice daily (last fill date 08/03/18) (20 mg/day of oxycodone) Highest recorded MME/day: 30 mg/day MME/day: 30 mg/day  Pill Count: None expected due to no prior prescriptions written by our practice. No notes on file Pharmacokinetics: Liberation and absorption (onset of action): WNL Distribution (time to peak effect): WNL Metabolism and excretion (duration of action): WNL         Pharmacodynamics: Desired effects: Analgesia: [name] reports >50% benefit. Functional ability: Patient reports that medication allows her to accomplish basic ADLs Clinically meaningful improvement in function (CMIF): Sustained CMIF goals met Perceived effectiveness: Described as relatively effective, allowing for increase in activities of daily living (ADL) Undesirable effects: Side-effects or Adverse reactions: None reported Monitoring: [place] PMP: Online review of the past 22-monthperiod previously conducted. Not applicable at this point since we have not taken over the patient's medication management yet. List of other Serum/Urine Drug Screening Test(s):  No  results found. List of all UDS test(s) done:  Lab Results  Component Value Date   SUMMARY FINAL 07/27/2018   Last UDS on record: Summary  Date Value Ref Range Status  07/27/2018 FINAL  Final    Comment:    ==================================================================== TOXASSURE COMP DRUG ANALYSIS,UR ==================================================================== Test                             Result       Flag       Units Drug Present and Declared for Prescription Verification   Oxycodone                      1719         EXPECTED   ng/mg creat   Oxymorphone                    1047         EXPECTED   ng/mg creat   Noroxycodone                   4691         EXPECTED   ng/mg creat   Noroxymorphone                 678          EXPECTED   ng/mg creat    Sources of oxycodone are scheduled prescription medications.    Oxymorphone, noroxycodone, and noroxymorphone are expected    metabolites of  oxycodone. Oxymorphone is also available as a    scheduled prescription medication.   Gabapentin                     PRESENT      EXPECTED   Tizanidine                     PRESENT      EXPECTED   Metoprolol                     PRESENT      EXPECTED ==================================================================== Test                      Result    Flag   Units      Ref Range   Creatinine              32               mg/dL      >=20 ==================================================================== Declared Medications:  The flagging and interpretation on this report are based on the  following declared medications.  Unexpected results may arise from  inaccuracies in the declared medications.  **Note: The testing scope of this panel includes these medications:  Gabapentin  Metoprolol  Oxycodone (OxyContin)  **Note: The testing scope of this panel does not include small to  moderate amounts of these reported medications:  Tizanidine (Zanaflex)  **Note: The testing scope of  this panel does not include following  reported medications:  Albuterol  Atorvastatin  Donepezil  Fluticasone  Furosemide  Nitroglycerin  Nystatin  Polyethylene Glycol (GlycoLAX)  Polyethylene Glycol (MiraLAX)  Potassium (Klor-Con)  Ramipril (Altace)  Salmeterol  Warfarin (Coumadin) ==================================================================== For clinical consultation, please call [phone]. ====================================================================    UDS interpretation: No unexpected findings.          Medication Assessment Form: Patient introduced to form today Treatment compliance: Treatment may start today if patient agrees with proposed plan. Evaluation of compliance is not applicable at this point Risk Assessment Profile: Aberrant behavior: See initial evaluations. None observed or detected today Comorbid factors increasing risk of overdose: See initial evaluation. No additional risks detected today Opioid risk tool (ORT) (Total Score):   Personal History of Substance Abuse (SUD-Substance use disorder):  Alcohol:    Illegal Drugs:    Rx Drugs:    ORT Risk Level calculation:   Risk of substance use disorder (SUD): Low  ORT Scoring interpretation table:  Score <3 = Low Risk for SUD  Score between 4-7 = Moderate Risk for SUD  Score >8 = High Risk for Opioid Abuse   Risk Mitigation Strategies:  Patient opioid safety counseling: Completed today. Counseling provided to patient as per "Patient Counseling Document". Document signed by patient, attesting to counseling and understanding Patient-Prescriber Agreement (PPA): Obtained today.  Controlled substance notification to other providers: Written and sent today.  Pharmacologic Plan: Today we may be taking over the patient's pharmacological regimen. See below.             Laboratory Chemistry  Inflammation Markers (CRP: Acute Phase) (ESR: Chronic Phase) Lab Results  Component Value Date   CRP  22 (H) 07/27/2018   ESRSEDRATE 87 (H) 07/27/2018   LATICACIDVEN 1.0 05/13/2018                         Rheumatology Markers No results found.  Renal Function Markers Lab Results  Component Value Date   BUN 13 07/27/2018   CREATININE 0.90 07/27/2018   BCR 14 07/27/2018   GFRAA 68 07/27/2018   GFRNONAA 59 (L) 07/27/2018                             Hepatic Function Markers Lab Results  Component Value Date   AST 192 (H) 07/27/2018   ALT 17 05/13/2018   ALBUMIN 3.6 07/27/2018   ALKPHOS 102 07/27/2018                        Electrolytes Lab Results  Component Value Date   NA 142 07/27/2018   K 4.5 07/27/2018   CL 107 (H) 07/27/2018   CALCIUM 8.8 07/27/2018   MG 2.2 07/27/2018                        Neuropathy Markers Lab Results  Component Value Date   VITAMINB12 947 07/27/2018   HGBA1C 6.5 (H) 05/13/2018                        CNS Tests No results found.  Bone Pathology Markers Lab Results  Component Value Date   25OHVITD1 11 (L) 07/27/2018   25OHVITD2 <1.0 07/27/2018   25OHVITD3 11 07/27/2018                         Coagulation Parameters Lab Results  Component Value Date   INR 3.94 05/19/2018   LABPROT 37.9 (H) 05/19/2018   PLT 161 05/19/2018                        Cardiovascular Markers Lab Results  Component Value Date   BNP 100.0 05/13/2018   TROPONINI <0.03 05/13/2018   HGB 9.4 (L) 05/19/2018   HCT 30.6 (L) 05/19/2018                         CA Markers No results found.  Note: Lab results reviewed.  Recent Diagnostic Imaging Review  Hip Imaging: Hip-R DG 2-3 views:  Results for orders placed during the hospital encounter of 11/16/16  DG Hip Unilat W or Wo Pelvis 2-3 Views Right   Narrative CLINICAL DATA:  Pain after fall  EXAM: DG HIP (WITH OR WITHOUT PELVIS) 2-3V RIGHT  COMPARISON:  None.  FINDINGS: There is no evidence of hip fracture or dislocation. There is no evidence of arthropathy or other focal bone  abnormality.  IMPRESSION: Negative.   Electronically Signed   By: Dorise Bullion III M.D   On: 11/16/2016 22:29    Wrist Imaging: Wrist-R DG Complete:  Results for orders placed during the hospital encounter of 11/16/16  DG Wrist Complete Right   Narrative CLINICAL DATA:  Pain after fall.  EXAM: RIGHT WRIST - COMPLETE 3+ VIEW  COMPARISON:  None.  FINDINGS: Vascular calcifications are identified. Soft tissue swelling is identified along the radial aspect of the wrist. Mild irregularity of the distal radius. No carpal bone fractures identified.  IMPRESSION: Mild irregularity along the radial aspect of the distal radius with overlying soft tissue swelling could represent a very subtle nondisplaced fracture.   Electronically Signed   By: Dorise Bullion III M.D   On: 11/16/2016 22:28    Complexity Note: Imaging results reviewed. Results shared with  Ms. Biancardi, using Layman's terms.                         Meds   Current Outpatient Medications:  .  albuterol (PROVENTIL HFA;VENTOLIN HFA) 108 (90 Base) MCG/ACT inhaler, Inhale 2 puffs into the lungs every 6 (six) hours as needed., Disp: , Rfl:  .  atorvastatin (LIPITOR) 80 MG tablet, Take 1 tablet by mouth daily., Disp: , Rfl:  .  donepezil (ARICEPT) 5 MG tablet, Take 5 mg by mouth at bedtime., Disp: , Rfl:  .  Fluticasone-Salmeterol (ADVAIR DISKUS) 250-50 MCG/DOSE AEPB, INHALE 1 PUFF INTO THE LUNGS ONCE DAILY AS DIRECTED, Disp: , Rfl:  .  furosemide (LASIX) 40 MG tablet, Take 80 mg by mouth 2 (two) times daily., Disp: , Rfl:  .  gabapentin (NEURONTIN) 600 MG tablet, Take 0.5 tablets (300 mg total) by mouth 2 (two) times daily., Disp: 60 tablet, Rfl: 0 .  metoprolol succinate (TOPROL-XL) 25 MG 24 hr tablet, Take 1 tablet (25 mg total) by mouth daily., Disp: 30 tablet, Rfl: 0 .  nitroGLYCERIN (NITROSTAT) 0.4 MG SL tablet, Place 0.4 mg under the tongue every 5 (five) minutes as needed for chest pain., Disp: , Rfl:  .   nystatin (MYCOSTATIN) 100000 UNIT/ML suspension, Take 5 mLs by mouth once., Disp: , Rfl:  .  oxyCODONE (OXYCONTIN) 10 mg 12 hr tablet, Take 1 tablet (10 mg total) by mouth every 12 (twelve) hours., Disp: 14 tablet, Rfl: 0 .  polyethylene glycol (MIRALAX / GLYCOLAX) packet, Take 17 g by mouth daily as needed for mild constipation., Disp: 14 each, Rfl: 0 .  potassium chloride SA (K-DUR,KLOR-CON) 20 MEQ tablet, TAKE 1 TABLET BY MOUTH TWICE A DAY., Disp: , Rfl:  .  ramipril (ALTACE) 10 MG capsule, Take 10 mg by mouth daily., Disp: , Rfl:  .  tiZANidine (ZANAFLEX) 2 MG tablet, Take 2 mg by mouth at bedtime., Disp: , Rfl:  .  warfarin (COUMADIN) 5 MG tablet, Take 0.5-1 tablets (2.5-5 mg total) by mouth one time only at 6 PM. Take 2.5 mg Monday, wed, Friday and 5 mg the remaining days, and need to adjust the dose per INR., Disp: 20 tablet, Rfl: 0  ROS  Constitutional: Denies any fever or chills Gastrointestinal: No reported hemesis, hematochezia, vomiting, or acute GI distress Musculoskeletal: Denies any acute onset joint swelling, redness, loss of ROM, or weakness Neurological: No reported episodes of acute onset apraxia, aphasia, dysarthria, agnosia, amnesia, paralysis, loss of coordination, or loss of consciousness  Allergies  Ms. Woollard is allergic to pentazocine lactate [pentazocine].  PFSH  Drug: Ms. Swalley  has no history on file for drug. Alcohol:  reports no history of alcohol use. Tobacco:  reports that she has never smoked. She has never used smokeless tobacco. Medical:  has a past medical history of CHF (congestive heart failure) ([place]), COPD (chronic obstructive pulmonary disease) ([place]), Coronary artery disease, Dementia ([place]), Diabetes mellitus without complication ([place]), Hyperlipidemia, and Hypertension. Surgical: Ms. Nakata  has a past surgical history that includes Tonsillectomy and Aortic valve replacement (avr)/coronary artery bypass grafting (cabg). Family: family history is not on  file.  Constitutional Exam  General appearance: Well nourished, well developed, and well hydrated. In no apparent acute distress There were no vitals filed for this visit. BMI Assessment: Estimated body mass index is 38.96 kg/m as calculated from the following:   Height as of 07/27/18: '5\' 4"'$  (1.626 m).   Weight as of  07/27/18: 227 lb (103 kg).  BMI interpretation table: BMI level Category Range association with higher incidence of chronic pain  <18 kg/m2 Underweight   18.5-24.9 kg/m2 Ideal body weight   25-29.9 kg/m2 Overweight Increased incidence by 20%  30-34.9 kg/m2 Obese (Class I) Increased incidence by 68%  35-39.9 kg/m2 Severe obesity (Class II) Increased incidence by 136%  >40 kg/m2 Extreme obesity (Class III) Increased incidence by 254%   Patient's current BMI Ideal Body weight  There is no height or weight on file to calculate BMI. Ideal body weight: 54.7 kg (120 lb 9.5 oz) Adjusted ideal body weight: 74 kg (163 lb 2.5 oz)   BMI Readings from Last 4 Encounters:  07/27/18 38.96 kg/m  05/18/18 41.92 kg/m  11/29/16 42.80 kg/m  11/16/16 47.55 kg/m   Wt Readings from Last 4 Encounters:  07/27/18 227 lb (103 kg)  05/18/18 229 lb 4.5 oz (104 kg)  11/29/16 234 lb (106.1 kg)  11/16/16 260 lb (117.9 kg)  Psych/Mental status: Alert, oriented x 3 (person, place, & time)       Eyes: PERLA Respiratory: No evidence of acute respiratory distress  Cervical Spine Area Exam  Skin & Axial Inspection: No masses, redness, edema, swelling, or associated skin lesions Alignment: Symmetrical Functional ROM: Unrestricted ROM      Stability: No instability detected Muscle Tone/Strength: Functionally intact. No obvious neuro-muscular anomalies detected. Sensory (Neurological): Unimpaired Palpation: No palpable anomalies              Upper Extremity (UE) Exam    Side: Right upper extremity  Side: Left upper extremity  Skin & Extremity Inspection: Skin color, temperature, and hair growth  are WNL. No peripheral edema or cyanosis. No masses, redness, swelling, asymmetry, or associated skin lesions. No contractures.  Skin & Extremity Inspection: Skin color, temperature, and hair growth are WNL. No peripheral edema or cyanosis. No masses, redness, swelling, asymmetry, or associated skin lesions. No contractures.  Functional ROM: Unrestricted ROM          Functional ROM: Unrestricted ROM          Muscle Tone/Strength: Functionally intact. No obvious neuro-muscular anomalies detected.  Muscle Tone/Strength: Functionally intact. No obvious neuro-muscular anomalies detected.  Sensory (Neurological): Unimpaired          Sensory (Neurological): Unimpaired          Palpation: No palpable anomalies              Palpation: No palpable anomalies              Provocative Test(s):  Phalen's test: deferred Tinel's test: deferred Apley's scratch test (touch opposite shoulder):  Action 1 (Across chest): deferred Action 2 (Overhead): deferred Action 3 (LB reach): deferred   Provocative Test(s):  Phalen's test: deferred Tinel's test: deferred Apley's scratch test (touch opposite shoulder):  Action 1 (Across chest): deferred Action 2 (Overhead): deferred Action 3 (LB reach): deferred    Thoracic Spine Area Exam  Skin & Axial Inspection: No masses, redness, or swelling Alignment: Symmetrical Functional ROM: Unrestricted ROM Stability: No instability detected Muscle Tone/Strength: Functionally intact. No obvious neuro-muscular anomalies detected. Sensory (Neurological): Unimpaired Muscle strength & Tone: No palpable anomalies  Lumbar Spine Area Exam  Skin & Axial Inspection: No masses, redness, or swelling Alignment: Symmetrical Functional ROM: Unrestricted ROM       Stability: No instability detected Muscle Tone/Strength: Functionally intact. No obvious neuro-muscular anomalies detected. Sensory (Neurological): Unimpaired Palpation: No palpable anomalies       Provocative  Tests: Hyperextension/rotation test: deferred today       Lumbar quadrant test (Kemp's test): deferred today       Lateral bending test: deferred today       Patrick's Maneuver: deferred today                   FABER* test: deferred today                   S-I anterior distraction/compression test: deferred today         S-I lateral compression test: deferred today         S-I Thigh-thrust test: deferred today         S-I Gaenslen's test: deferred today         *(Flexion, ABduction and External Rotation)  Gait & Posture Assessment  Ambulation: Unassisted Gait: Relatively normal for age and body habitus Posture: WNL   Lower Extremity Exam    Side: Right lower extremity  Side: Left lower extremity  Stability: No instability observed          Stability: No instability observed          Skin & Extremity Inspection: Skin color, temperature, and hair growth are WNL. No peripheral edema or cyanosis. No masses, redness, swelling, asymmetry, or associated skin lesions. No contractures.  Skin & Extremity Inspection: Skin color, temperature, and hair growth are WNL. No peripheral edema or cyanosis. No masses, redness, swelling, asymmetry, or associated skin lesions. No contractures.  Functional ROM: Unrestricted ROM                  Functional ROM: Unrestricted ROM                  Muscle Tone/Strength: Functionally intact. No obvious neuro-muscular anomalies detected.  Muscle Tone/Strength: Functionally intact. No obvious neuro-muscular anomalies detected.  Sensory (Neurological): Unimpaired        Sensory (Neurological): Unimpaired        DTR: Patellar: deferred today Achilles: deferred today Plantar: deferred today  DTR: Patellar: deferred today Achilles: deferred today Plantar: deferred today  Palpation: No palpable anomalies  Palpation: No palpable anomalies   Assessment & Plan  Primary Diagnosis & Pertinent Problem List: The primary encounter diagnosis was Chronic pain syndrome.  Diagnoses of Chronic upper back pain (Primary Area of Pain) (Right), Postherpetic neuralgia (Thoracic) (Right), Chronic thoracic back pain (Right), Thoracic radicular pain (Right), Thoracic radiculitis (Right), Chronic lower extremity pain (Tertiary Area of Pain) (Left), Chronic low back pain (Bilateral) (R>L) w/o sciatica, Neurogenic pain, Disorder of skeletal system, Pharmacologic therapy, Long term current use of opiate analgesic, Long term (current) use of anticoagulants ([place]), Problems influencing health status, BMI 40.0-44.9, adult (HCC), Chronic diastolic CHF (congestive heart failure) (HCC), Elevated C-reactive protein (CRP), Elevated sed rate, Senile dementia without behavioral disturbance ([place]), and Hx of aortic valve replacement, mechanical were also pertinent to this visit.  Visit Diagnosis: 1. Chronic pain syndrome   2. Chronic upper back pain (Primary Area of Pain) (Right)   3. Postherpetic neuralgia (Thoracic) (Right)   4. Chronic thoracic back pain (Right)   5. Thoracic radicular pain (Right)   6. Thoracic radiculitis (Right)   7. Chronic lower extremity pain (Tertiary Area of Pain) (Left)   8. Chronic low back pain (Bilateral) (R>L) w/o sciatica   9. Neurogenic pain   10. Disorder of skeletal system   11. Pharmacologic therapy   12. Long term current use of opiate analgesic   13. Long  term (current) use of anticoagulants ([place])   14. Problems influencing health status   15. BMI 40.0-44.9, adult ([place])   16. Chronic diastolic CHF (congestive heart failure) ([place])   17. Elevated C-reactive protein (CRP)   18. Elevated sed rate   19. Senile dementia without behavioral disturbance ([place])   20. Hx of aortic valve replacement, mechanical    Problems updated and reviewed during this visit: No problems updated.  Plan of Care  Pharmacotherapy (Medications Ordered): No orders of the defined types were placed in this encounter.  Procedure Orders    No procedure(s) ordered  today   Lab Orders  No laboratory test(s) ordered today   Imaging Orders  No imaging studies ordered today   Referral Orders  No referral(s) requested today    Pharmacological management options:  Opioid Analgesics: We'll take over management today. See above orders Membrane stabilizer: We have discussed the possibility of optimizing this mode of therapy, if tolerated Muscle relaxant: We have discussed the possibility of a trial NSAID: We have discussed the possibility of a trial Other analgesic(s): To be determined at a later time   Interventional management options: Planned, scheduled, and/or pending:    ***   Considering:   Under consideration   PRN Procedures:   None at this time   Provider-requested follow-up: No follow-ups on file.  Future Appointments  Date Time Provider [place]  08/10/2018 10:45 AM Milinda Pointer, MD [facility] None  09/01/2018 11:30 AM [facility] [place] [id number]    Primary Care Physician: Katheren Shams Location: [place] [facility] Note by: Gaspar Cola, MD Date: 08/10/2018; Time: 5:48 AM

## 2018-08-10 ENCOUNTER — Ambulatory Visit: Payer: Medicare Other | Admitting: Pain Medicine

## 2018-10-29 ENCOUNTER — Other Ambulatory Visit (INDEPENDENT_AMBULATORY_CARE_PROVIDER_SITE_OTHER): Payer: Self-pay | Admitting: Vascular Surgery

## 2018-10-29 DIAGNOSIS — L97429 Non-pressure chronic ulcer of left heel and midfoot with unspecified severity: Secondary | ICD-10-CM

## 2018-11-02 ENCOUNTER — Encounter (INDEPENDENT_AMBULATORY_CARE_PROVIDER_SITE_OTHER): Payer: Medicare Other | Admitting: Vascular Surgery

## 2018-11-02 ENCOUNTER — Encounter (INDEPENDENT_AMBULATORY_CARE_PROVIDER_SITE_OTHER): Payer: Medicare Other

## 2018-12-12 IMAGING — CT CT HEAD W/O CM
3 series · 15 of 46 positions shown, 18 images · non-contrast
Comparison: 05/11/2017

CLINICAL DATA: Altered level of consciousness, leg pain and
swelling for 2 weeks

EXAM:
CT HEAD WITHOUT CONTRAST
TECHNIQUE: Contiguous axial images were obtained from the base of the skull
through the vertex without intravenous contrast.

[Series 2: head wo · axial · 0.47mm/px · z∈[-156,-36]mm · 9 of 29 slices shown, 12 images]
[im 3/29  brain]
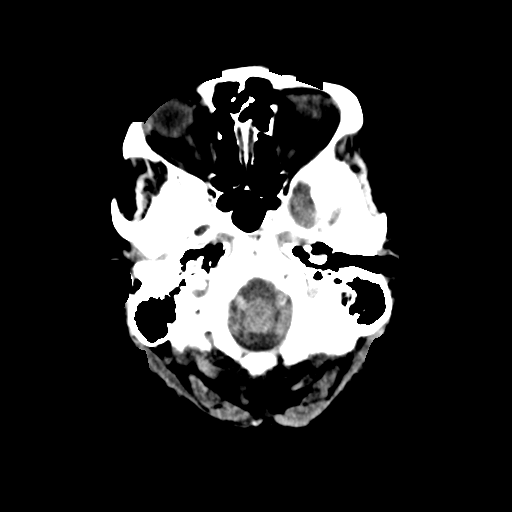
[im 3/29  bone]
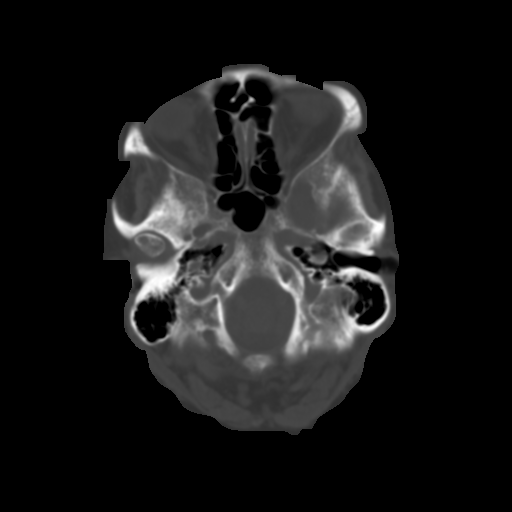
[im 6/29  brain]
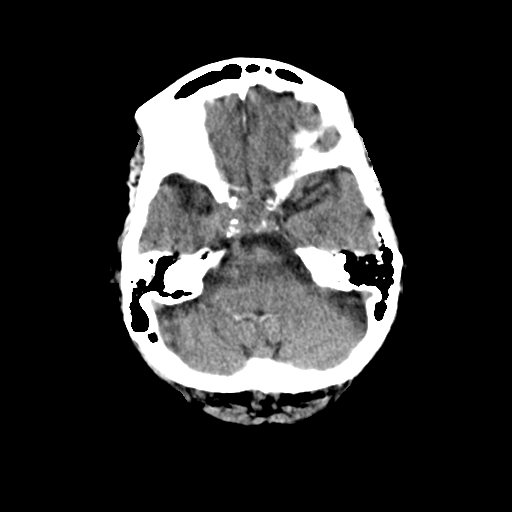
[im 9/29  brain]
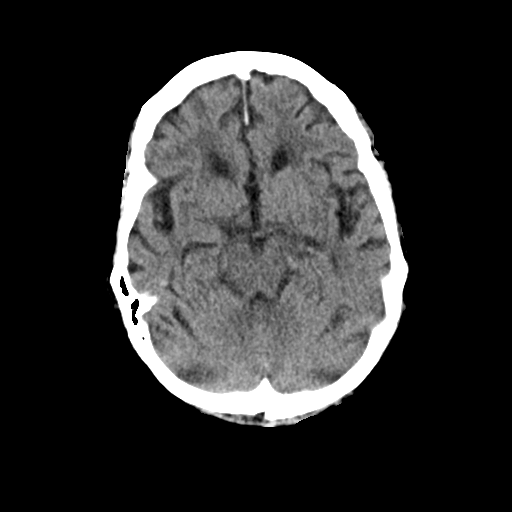
[im 12/29  brain]
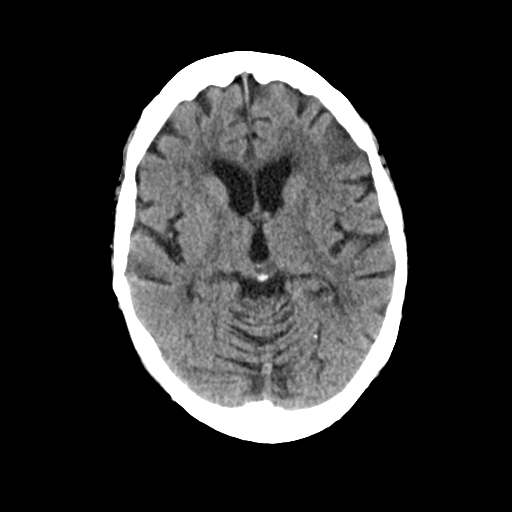
[im 15/29  brain]
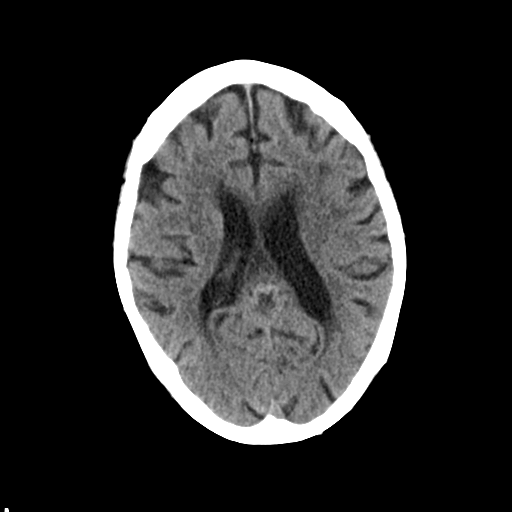
[im 15/29  bone]
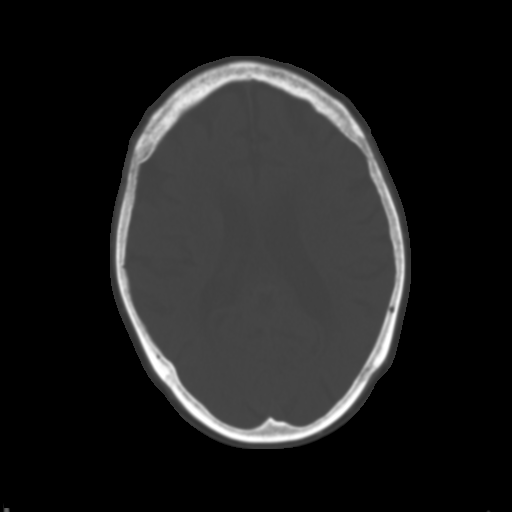
[im 18/29  brain]
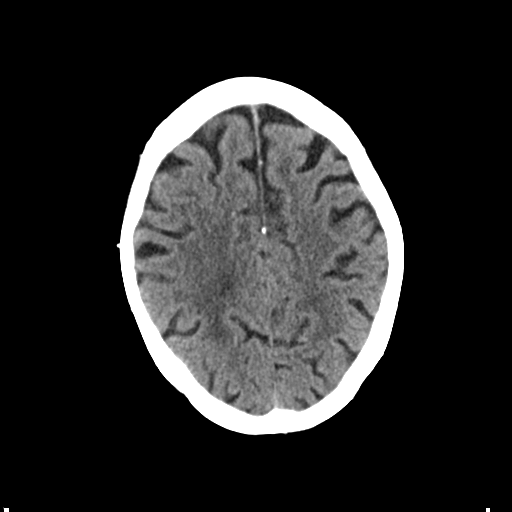
[im 21/29  brain]
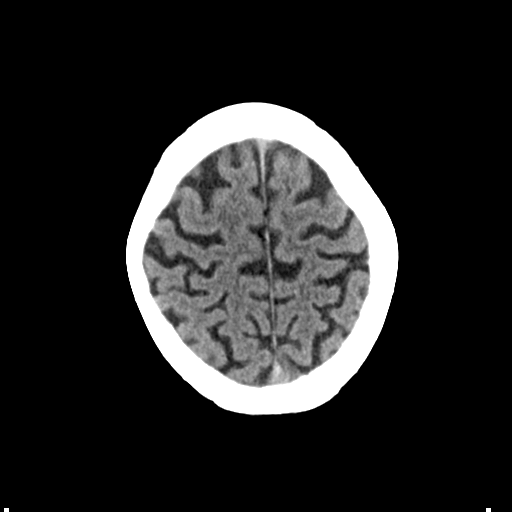
[im 24/29  brain]
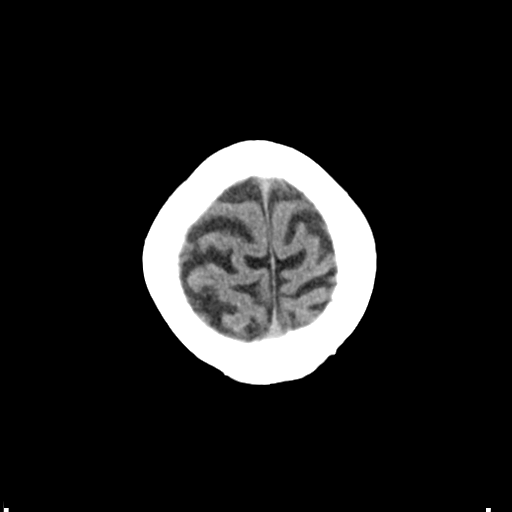
[im 27/29  brain]
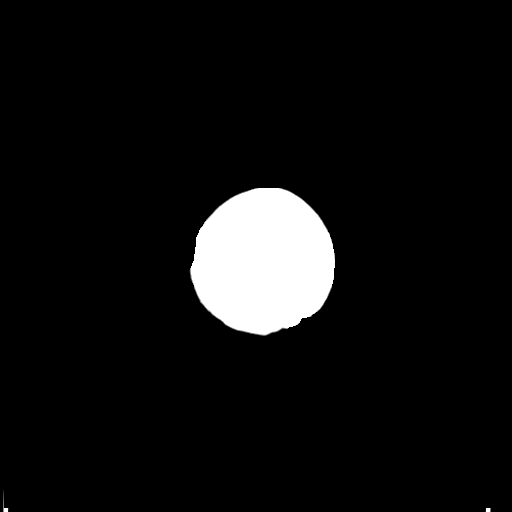
[im 27/29  bone]
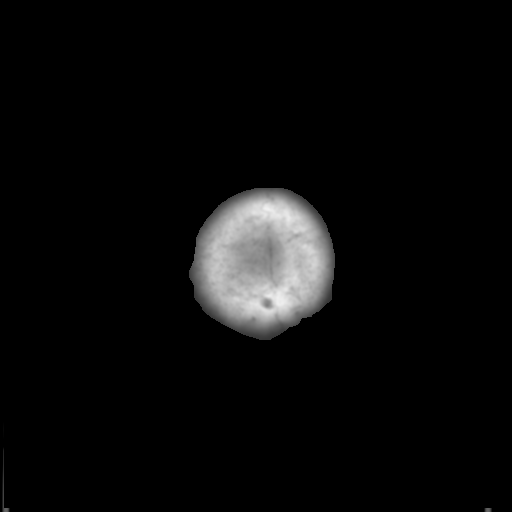

[Series 4: coronal soft tissue · coronal · 0.30mm/px · 3 of 62 slices shown]
[im 21/62  brain]
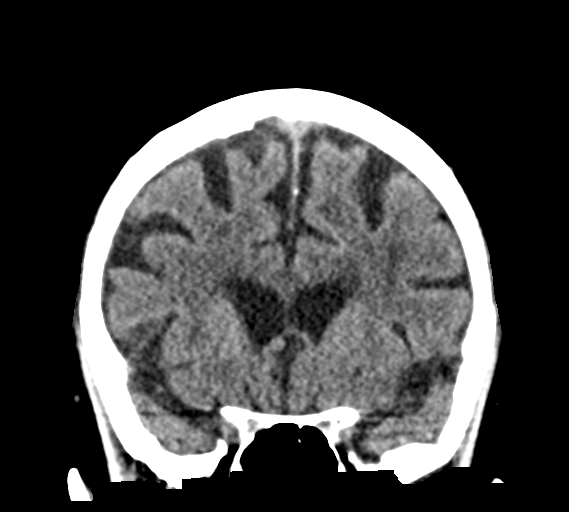
[im 28/62  brain]
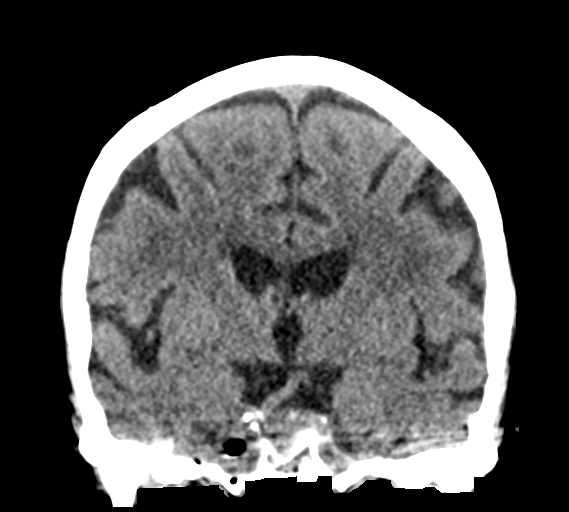
[im 34/62  brain]
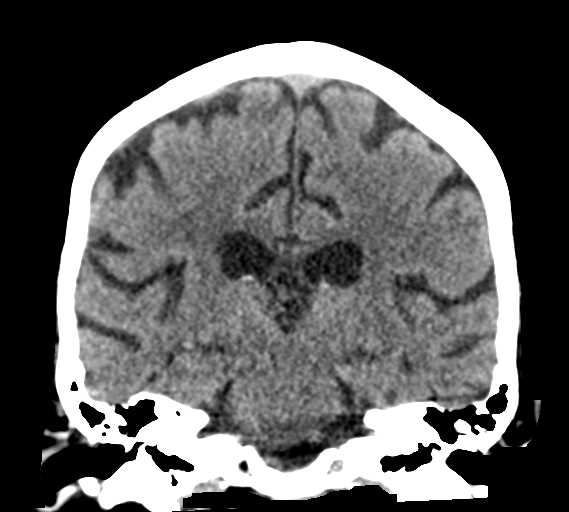

[Series 5: sagittal soft tissue · sagittal · 0.29mm/px · 3 of 51 slices shown]
[im 17/51  brain]
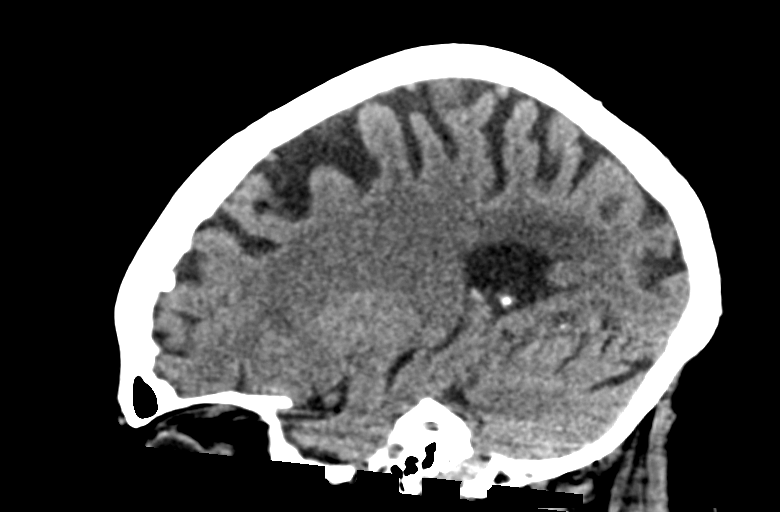
[im 26/51  brain]
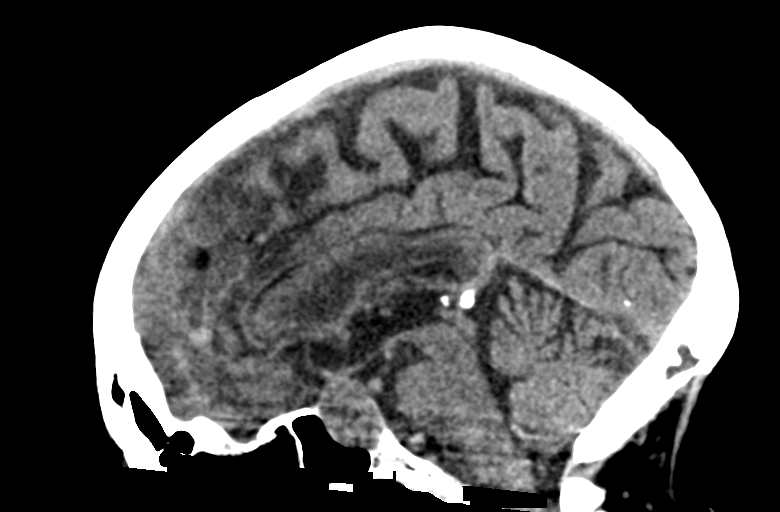
[im 34/51  brain]
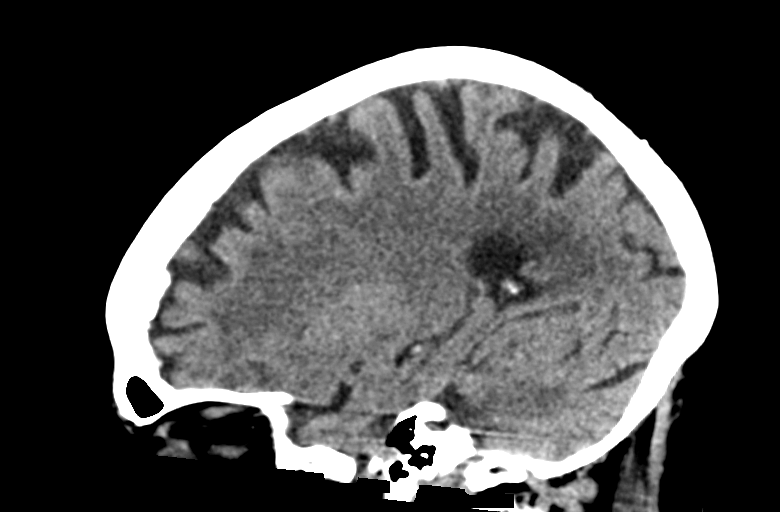

[15 of 46 positions shown; findings below may reference images not displayed]

FINDINGS: Brain: Similar brain atrophy pattern and chronic white matter
microvascular changes throughout both cerebral hemispheres. No acute
hemorrhage, definite new infarction, mass lesion, midline shift,
herniation, hydrocephalus or extra-axial fluid collection. No focal
mass effect or edema. Cisterns are patent. Cerebellar atrophy as
well. Pituitary heterogeneous density mass appears stable compatible
with known macroadenoma.

Vascular: Intracranial atherosclerosis.  No hyperdense vessel.

Skull: No acute osseous finding or fracture.

Sinuses/Orbits: No acute finding.

Other: None.
IMPRESSION: Stable atrophy and chronic white matter microvascular changes

No acute intracranial abnormality by noncontrast CT.

Stable pituitary mass compatible with known macroadenoma.

## 2019-02-02 ENCOUNTER — Other Ambulatory Visit: Payer: Self-pay | Admitting: Internal Medicine

## 2019-02-02 DIAGNOSIS — R1311 Dysphagia, oral phase: Secondary | ICD-10-CM

## 2019-02-08 ENCOUNTER — Encounter: Payer: Medicare Other | Attending: Physician Assistant | Admitting: Physician Assistant

## 2019-02-08 ENCOUNTER — Other Ambulatory Visit: Payer: Self-pay

## 2019-02-08 DIAGNOSIS — F0390 Unspecified dementia without behavioral disturbance: Secondary | ICD-10-CM | POA: Insufficient documentation

## 2019-02-08 DIAGNOSIS — E119 Type 2 diabetes mellitus without complications: Secondary | ICD-10-CM | POA: Insufficient documentation

## 2019-02-08 DIAGNOSIS — I5042 Chronic combined systolic (congestive) and diastolic (congestive) heart failure: Secondary | ICD-10-CM | POA: Insufficient documentation

## 2019-02-08 DIAGNOSIS — I2510 Atherosclerotic heart disease of native coronary artery without angina pectoris: Secondary | ICD-10-CM | POA: Diagnosis not present

## 2019-02-08 DIAGNOSIS — I7389 Other specified peripheral vascular diseases: Secondary | ICD-10-CM | POA: Diagnosis not present

## 2019-02-08 DIAGNOSIS — J449 Chronic obstructive pulmonary disease, unspecified: Secondary | ICD-10-CM | POA: Insufficient documentation

## 2019-02-08 DIAGNOSIS — I110 Hypertensive heart disease with heart failure: Secondary | ICD-10-CM | POA: Diagnosis not present

## 2019-02-08 DIAGNOSIS — I890 Lymphedema, not elsewhere classified: Secondary | ICD-10-CM | POA: Insufficient documentation

## 2019-02-08 NOTE — Progress Notes (Signed)
KAMILIA, CAROLLO ([id number]) Visit Report for 02/08/2019 Abuse/Suicide Risk Screen Details Patient Name: Rhonda Saunders, Rhonda Saunders. Date of Service: 02/08/2019 2:15 PM Medical Record Number: [id number] Patient Account Number: [id number] Date of Birth/Sex: 1932/01/27 (85 y.o. F) Treating RN: Montey Hora Primary Care Seona Clemenson: Ramonita Lab Other Clinician: Referring Wynona Duhamel: Ramonita Lab Treating Haylyn Halberg/Extender: Melburn Hake, HOYT Weeks in Treatment: 0 Abuse/Suicide Risk Screen Items Answer ABUSE RISK SCREEN: Has anyone close to you tried to hurt or harm you recentlyo No Do you feel uncomfortable with anyone in your familyo No Has anyone forced you do things that you didnot want to doo No Electronic Signature(s) Signed: 02/08/2019 4:17:58 PM By: Montey Hora Entered By: Montey Hora on 02/08/2019 14:40:33 Hattabaugh, Antionette Poles ([id number]) -------------------------------------------------------------------------------- Activities of Daily Living Details Patient Name: Rhonda, Saunders. Date of Service: 02/08/2019 2:15 PM Medical Record Number: [id number] Patient Account Number: [id number] Date of Birth/Sex: 1931/09/10 (85 y.o. F) Treating RN: Montey Hora Primary Care Remington Highbaugh: Ramonita Lab Other Clinician: Referring Tais Koestner: Ramonita Lab Treating Shakirra Buehler/Extender: Melburn Hake, HOYT Weeks in Treatment: 0 Activities of Daily Living Items Answer Activities of Daily Living (Please select one for each item) Drive Automobile Not Able Take Medications Need Assistance Use Telephone Need Assistance Care for Appearance Need Assistance Use Toilet Need Assistance Bath / Shower Need Assistance Dress Self Need Assistance Feed Self Completely Able Walk Need Assistance Get In / Out Bed Need Assistance Housework Need Assistance Prepare Meals Need Assistance Handle Money Need Assistance Shop for Self Need Assistance Electronic Signature(s) Signed: 02/08/2019 4:17:58 PM By: Montey Hora Entered By: Montey Hora on 02/08/2019 14:41:05 Rhonda Saunders ([id number]) -------------------------------------------------------------------------------- Education Screening Details Patient Name: Rhonda Saunders. Date of Service: 02/08/2019 2:15 PM Medical Record Number: [id number] Patient Account Number: [id number] Date of Birth/Sex: [birth year] (85 y.o. F) Treating RN: Montey Hora Primary Care Kyngston Pickelsimer: Ramonita Lab Other Clinician: Referring Aalijah Lanphere: Ramonita Lab Treating Kalid Ghan/Extender: Melburn Hake, HOYT Weeks in Treatment: 0 Primary Learner Assessed: Patient Learning Preferences/Education Level/Primary Language Learning Preference: Explanation, Demonstration, Printed Material Highest Education Level: High School Preferred Language: English Cognitive Barrier Language Barrier: No Translator Needed: No Memory Deficit: No Emotional Barrier: No Cultural/Religious Beliefs Affecting Medical Care: No Physical Barrier Impaired Vision: No Impaired Hearing: No Decreased Hand dexterity: No Knowledge/Comprehension Knowledge Level: Medium Comprehension Level: Medium Ability to understand written Medium instructions: Ability to understand verbal Medium instructions: Motivation Anxiety Level: Calm Cooperation: Cooperative Education Importance: Acknowledges Need Interest in Health Problems: Asks Questions Perception: Coherent Willingness to Engage in Self- Medium Management Activities: Readiness to Engage in Self- Medium Management Activities: Electronic Signature(s) Signed: 02/08/2019 4:17:58 PM By: Montey Hora Entered By: Montey Hora on 02/08/2019 14:41:32 Mykenna, Viele Antionette Poles ([id number]) -------------------------------------------------------------------------------- Fall Risk Assessment Details Patient Name: Rhonda Saunders. Date of Service: 02/08/2019 2:15 PM Medical Record Number: [id number] Patient Account Number: [id number] Date of Birth/Sex: 06/10/1932 (85 y.o. F) Treating RN: Montey Hora Primary Care Gaylon Bentz: Ramonita Lab Other Clinician: Referring Raymir Frommelt: Ramonita Lab Treating Cornelius Schuitema/Extender: Melburn Hake, HOYT Weeks in Treatment: 0 Fall Risk Assessment Items Have you had 2 or more falls in the last 12 monthso 0 No Have you had any fall that resulted in injury in the last 12 monthso 0 No FALLS RISK SCREEN History of falling - immediate or within 3 months 0 No Secondary diagnosis (Do you have 2 or more medical diagnoseso) 0 No Ambulatory aid None/bed rest/wheelchair/nurse 0 No Crutches/cane/walker 15 Yes Furniture 0 No Intravenous therapy Access/Saline/Heparin Lock 0 No Gait/Transferring Normal/ bed rest/ wheelchair 0  No Weak (short steps with or without shuffle, stooped but able to lift head while 10 Yes walking, may seek support from furniture) Impaired (short steps with shuffle, may have difficulty arising from chair, head 20 Yes down, impaired balance) Mental Status Oriented to own ability 0 Yes Electronic Signature(s) Signed: 02/08/2019 4:17:58 PM By: Montey Hora Entered By: Montey Hora on 02/08/2019 14:41:45 Scullion, Antionette Poles ([id number]) -------------------------------------------------------------------------------- Foot Assessment Details Patient Name: Rhonda Saunders. Date of Service: 02/08/2019 2:15 PM Medical Record Number: [id number] Patient Account Number: [id number] Date of Birth/Sex: May 25, 1932 (85 y.o. F) Treating RN: Montey Hora Primary Care Keysean Savino: Ramonita Lab Other Clinician: Referring Monnica Saltsman: Ramonita Lab Treating Aleaha Fickling/Extender: Melburn Hake, HOYT Weeks in Treatment: 0 Foot Assessment Items Site Locations + = Sensation present, - = Sensation absent, C = Callus, U = Ulcer R = Redness, W = Warmth, M = Maceration, PU = Pre-ulcerative lesion F = Fissure, S = Swelling, D = Dryness Assessment Right: Left: Other Deformity: No No Prior Foot Ulcer: No No Prior Amputation: No No Charcot Joint: No No Ambulatory Status:  Ambulatory With Help Assistance Device: Walker Gait: Administrator, arts) Signed: 02/08/2019 4:17:58 PM By: Montey Hora Entered By: Montey Hora on 02/08/2019 14:43:10 Rhonda Saunders ([id number]) -------------------------------------------------------------------------------- Nutrition Risk Screening Details Patient Name: Rhonda Saunders. Date of Service: 02/08/2019 2:15 PM Medical Record Number: [id number] Patient Account Number: [id number] Date of Birth/Sex: [birth year] (85 y.o. F) Treating RN: Montey Hora Primary Care Ishi Danser: Ramonita Lab Other Clinician: Referring Ambre Kobayashi: Ramonita Lab Treating [place]ington Krotz/Extender: Melburn Hake, HOYT Weeks in Treatment: 0 Height (in): 64 Weight (lbs): 217 Body Mass Index (BMI): 37.2 Nutrition Risk Screening Items Score Screening NUTRITION RISK SCREEN: I have an illness or condition that made me change the kind and/or amount of 0 No food I eat I eat fewer than two meals per day 0 No I eat few fruits and vegetables, or milk products 0 No I have three or more drinks of beer, liquor or wine almost every day 0 No I have tooth or mouth problems that make it hard for me to eat 0 No I don't always have enough money to buy the food I need 0 No I eat alone most of the time 0 No I take three or more different prescribed or over-the-counter drugs a day 1 Yes Without wanting to, I have lost or gained 10 pounds in the last six months 0 No I am not always physically able to shop, cook and/or feed myself 0 No Nutrition Protocols Good Risk Protocol 0 No interventions needed Moderate Risk Protocol High Risk Proctocol Risk Level: Good Risk Score: 1 Electronic Signature(s) Signed: 02/08/2019 4:17:58 PM By: Montey Hora Entered By: Montey Hora on 02/08/2019 14:41:52

## 2019-02-09 NOTE — Progress Notes (Signed)
KYNDELL, ZEISER ([id number]) Visit Report for 02/08/2019 Chief Complaint Document Details Patient Name: Rhonda Saunders, Rhonda Saunders. Date of Service: 02/08/2019 2:15 PM Medical Record Number: [id number] Patient Account Number: [id number] Date of Birth/Sex: 1931-11-17 (85 y.o. F) Treating RN: Cornell Barman Primary Care Provider: Ramonita Lab Other Clinician: Referring Provider: Ramonita Lab Treating Provider/Extender: Melburn Hake, Taniyah Ballow Weeks in Treatment: 0 Information Obtained from: Patient Chief Complaint Bilateral Lymphedema Electronic Signature(s) Signed: 02/09/2019 8:15:15 AM By: Worthy Keeler PA-C Entered By: Worthy Keeler on 02/08/2019 14:55:17 Rhonda Saunders, Rhonda Saunders ([id number]) -------------------------------------------------------------------------------- HPI Details Patient Name: Rhonda Saunders. Date of Service: 02/08/2019 2:15 PM Medical Record Number: [id number] Patient Account Number: [id number] Date of Birth/Sex: 1932-05-29 (85 y.o. F) Treating RN: Cornell Barman Primary Care Provider: Ramonita Lab Other Clinician: Referring Provider: Ramonita Lab Treating Provider/Extender: Melburn Hake, Iver Miklas Weeks in Treatment: 0 History of Present Illness HPI Description: 02/08/19 on evaluation today patient presents for initial inspection in our office concerning issues that she's been having with her bilateral lower extremities for some time it appears. She is a referral from her primary care provider for further evaluation. It sounds like she has areas that will open up and drain at times although right now she is actually doing quite well. She does have bilateral stage III lymphedema based on appearance today. She has a lot of scaling as well over her skin. Nonetheless I do see were obviously she will have issues at times but what I'm seeing at this point shows no evidence of open wounds. She does have a history of diabetes although this is mostly control just with diet. She's not on any oral medications or insulin at  this point. She does have a history of congestive heart failure, hypertension, and chronic venous stasis which has led to lymphedema as well. Electronic Signature(s) Signed: 02/09/2019 8:15:15 AM By: Worthy Keeler PA-C Entered By: Worthy Keeler on 02/09/2019 08:13:39 Rhonda Saunders, Rhonda Saunders ([id number]) -------------------------------------------------------------------------------- Physical Exam Details Patient Name: Rhonda Saunders, Rhonda Saunders. Date of Service: 02/08/2019 2:15 PM Medical Record Number: [id number] Patient Account Number: [id number] Date of Birth/Sex: 1932/07/08 (85 y.o. F) Treating RN: Cornell Barman Primary Care Provider: Ramonita Lab Other Clinician: Referring Provider: Ramonita Lab Treating Provider/Extender: Melburn Hake, Zayanna Pundt Weeks in Treatment: 0 Constitutional patient is hypertensive.. pulse regular and within target range for patient.Marland Kitchen respirations regular, non-labored and within target range for patient.Marland Kitchen temperature within target range for patient.. Well-nourished and well-hydrated in no acute distress. Eyes conjunctiva clear no eyelid edema noted. pupils equal round and reactive to light and accommodation. Ears, Nose, Mouth, and Throat no gross abnormality of ear auricles or external auditory canals. normal hearing noted during conversation. mucus membranes moist. Respiratory normal breathing without difficulty. clear to auscultation bilaterally. Cardiovascular regular rate and rhythm with normal S1, S2. Absent posterior tibial and dorsalis pedis pulses bilateral lower extremities. Stage 3 lymphedema. Gastrointestinal (GI) soft, non-tender, non-distended, +BS. no ventral hernia noted. Musculoskeletal Patient unable to walk without assistance. no significant deformity or arthritic changes, no loss or range of motion, no clubbing. Psychiatric this patient is able to make decisions and demonstrates good insight into disease process. Alert and Oriented x 3. pleasant and  cooperative. Notes Upon inspection today patient does have bilateral stage III lymphedema. She does attempt to elevate her legs as much as possible and tries to be as active as possible all things considered. With that being said she does have intermittent issues with open wounds although right now I do not see any evidence  of open wounds at this point. She has not seen a vascular specialist and initially when I mentioned this was somewhat reluctant to doing so. Electronic Signature(s) Signed: 02/09/2019 8:15:15 AM By: Worthy Keeler PA-C Entered By: Worthy Keeler on 02/09/2019 08:14:34 Rhonda, [name] ([id number]) -------------------------------------------------------------------------------- Physician Orders Details Patient Name: Rhonda Saunders, Rhonda Saunders. Date of Service: 02/08/2019 2:15 PM Medical Record Number: [id number] Patient Account Number: [id number] Date of Birth/Sex: Mar 17, 1932 (85 y.o. F) Treating RN: Cornell Barman Primary Care Provider: Ramonita Lab Other Clinician: Referring Provider: Ramonita Lab Treating Provider/Extender: Melburn Hake, Zahrah Sutherlin Weeks in Treatment: 0 Verbal / Phone Orders: No Diagnosis Coding ICD-10 Coding Code Description I89.0 Lymphedema, not elsewhere classified I73.89 Other specified peripheral vascular diseases I50.42 Chronic combined systolic (congestive) and diastolic (congestive) heart failure I10 Essential (primary) hypertension J44.9 Chronic obstructive pulmonary disease, unspecified I25.10 Atherosclerotic heart disease of native coronary artery without angina pectoris Wound Cleansing o Other: - Dial antibacterial soap, wash wounds, rinse and pat dry Skin Barriers/Peri-Wound Care o Moisturizing lotion Edema Control o Elevate legs to the level of the heart and pump ankles as often as possible Consults o Vascular - consult for lymphedema Electronic Signature(s) Signed: 02/09/2019 8:15:15 AM By: Worthy Keeler PA-C Signed: 02/09/2019 9:33:28 AM By:  Gretta Cool, BSN, RN, CWS, Kim RN, BSN Entered By: Gretta Cool, BSN, RN, CWS, Kim on 02/08/2019 15:14:11 Rhonda Saunders ([id number]) -------------------------------------------------------------------------------- Problem List Details Patient Name: Rhonda Saunders, Rhonda Saunders. Date of Service: 02/08/2019 2:15 PM Medical Record Number: [id number] Patient Account Number: [id number] Date of Birth/Sex: 1931/10/09 (85 y.o. F) Treating RN: Cornell Barman Primary Care Provider: Ramonita Lab Other Clinician: Referring Provider: Ramonita Lab Treating Provider/Extender: Melburn Hake, Yeison Sippel Weeks in Treatment: 0 Active Problems ICD-10 Evaluated Encounter Code Description Active Date Today Diagnosis I89.0 Lymphedema, not elsewhere classified 02/08/2019 No Yes I73.89 Other specified peripheral vascular diseases 02/08/2019 No Yes I50.42 Chronic combined systolic (congestive) and diastolic 8/84/1660 No Yes (congestive) heart failure I10 Essential (primary) hypertension 02/08/2019 No Yes J44.9 Chronic obstructive pulmonary disease, unspecified 02/08/2019 No Yes I25.10 Atherosclerotic heart disease of native coronary artery 02/08/2019 No Yes without angina pectoris Inactive Problems Resolved Problems Electronic Signature(s) Signed: 02/09/2019 8:15:15 AM By: Worthy Keeler PA-C Entered By: Worthy Keeler on 02/08/2019 14:55:04 Rhonda Saunders ([id number]) -------------------------------------------------------------------------------- Progress Note Details Patient Name: Rhonda Saunders. Date of Service: 02/08/2019 2:15 PM Medical Record Number: [id number] Patient Account Number: [id number] Date of Birth/Sex: Jul 28, 1931 (85 y.o. F) Treating RN: Cornell Barman Primary Care Provider: Ramonita Lab Other Clinician: Referring Provider: Ramonita Lab Treating Provider/Extender: Melburn Hake, Staphanie Harbison Weeks in Treatment: 0 Subjective Chief Complaint Information obtained from Patient Bilateral Lymphedema History of Present Illness (HPI) 02/08/19 on evaluation  today patient presents for initial inspection in our office concerning issues that she's been having with her bilateral lower extremities for some time it appears. She is a referral from her primary care provider for further evaluation. It sounds like she has areas that will open up and drain at times although right now she is actually doing quite well. She does have bilateral stage III lymphedema based on appearance today. She has a lot of scaling as well over her skin. Nonetheless I do see were obviously she will have issues at times but what I'm seeing at this point shows no evidence of open wounds. She does have a history of diabetes although this is mostly control just with diet. She's not on any oral medications or insulin at this point. She does have a history  of congestive heart failure, hypertension, and chronic venous stasis which has led to lymphedema as well. Patient History Information obtained from Patient. Allergies No Known Drug Allergies Family History Diabetes - Father, No family history of Cancer, Heart Disease, Hereditary Spherocytosis, Hypertension, Kidney Disease, Lung Disease, Seizures, Stroke, Thyroid Problems, Tuberculosis. Social History Never smoker, Marital Status - Widowed, Alcohol Use - Never, Drug Use - No History, Caffeine Use - Rarely. Medical History Ear/Nose/Mouth/Throat Denies history of Chronic sinus problems/congestion, Middle ear problems Respiratory Patient has history of Asthma, Chronic Obstructive Pulmonary Disease (COPD) Denies history of Aspiration, Pneumothorax, Sleep Apnea, Tuberculosis Cardiovascular Patient has history of Congestive Heart Failure, Coronary Artery Disease, Hypertension Denies history of Angina, Arrhythmia, Deep Vein Thrombosis, Hypotension, Myocardial Infarction, Peripheral Arterial Disease, Peripheral Venous Disease, Phlebitis, Vasculitis Endocrine Patient has history of Type II Diabetes Denies history of Type I  Diabetes Integumentary (Skin) Denies history of History of Burn, History of pressure wounds Musculoskeletal Rhonda Saunders, Rhonda Saunders ([id number]) Patient has history of Osteoarthritis Denies history of Gout, Rheumatoid Arthritis, Osteomyelitis Neurologic Patient has history of Dementia Denies history of Neuropathy, Quadriplegia, Paraplegia, Seizure Disorder Patient is treated with Controlled Diet. Medical And Surgical History Notes Ear/Nose/Mouth/Throat HOH - right ear deaf Cardiovascular HLD, titanium valve Review of Systems (ROS) Constitutional Symptoms (General Health) Denies complaints or symptoms of Fatigue, Fever, Chills, Marked Weight Change. Eyes Denies complaints or symptoms of Dry Eyes, Vision Changes, Glasses / Contacts. Ear/Nose/Mouth/Throat Denies complaints or symptoms of Difficult clearing ears, Sinusitis. Hematologic/Lymphatic Denies complaints or symptoms of Bleeding / Clotting Disorders, Human Immunodeficiency Virus. Respiratory Denies complaints or symptoms of Chronic or frequent coughs, Shortness of Breath. Cardiovascular Complains or has symptoms of LE edema. Denies complaints or symptoms of Chest pain. Gastrointestinal Denies complaints or symptoms of Frequent diarrhea, Nausea, Vomiting. Endocrine Denies complaints or symptoms of Hepatitis, Thyroid disease, Polydypsia (Excessive Thirst). Genitourinary Denies complaints or symptoms of Kidney failure/ Dialysis, Incontinence/dribbling. Immunological Denies complaints or symptoms of Hives, Itching. Integumentary (Skin) Complains or has symptoms of Swelling. Denies complaints or symptoms of Wounds, Bleeding or bruising tendency, Breakdown. Musculoskeletal Denies complaints or symptoms of Muscle Pain, Muscle Weakness. Neurologic Denies complaints or symptoms of Numbness/parasthesias, Focal/Weakness. Psychiatric Denies complaints or symptoms of Anxiety, Claustrophobia. Objective Constitutional patient is  hypertensive.. pulse regular and within target range for patient.Marland Kitchen respirations regular, non-labored and within target range for patient.Marland Kitchen temperature within target range for patient.. Well-nourished and well-hydrated in no acute distress. Rhonda Saunders, Rhonda Saunders ([id number]) Vitals Time Taken: 2:31 PM, Height: 64 in, Source: Measured, Weight: 217 lbs, Source: Measured, BMI: 37.2, Temperature: 98.8 F, Pulse: 68 bpm, Respiratory Rate: 18 breaths/min, Blood Pressure: 140/57 mmHg. Eyes conjunctiva clear no eyelid edema noted. pupils equal round and reactive to light and accommodation. Ears, Nose, Mouth, and Throat no gross abnormality of ear auricles or external auditory canals. normal hearing noted during conversation. mucus membranes moist. Respiratory normal breathing without difficulty. clear to auscultation bilaterally. Cardiovascular regular rate and rhythm with normal S1, S2. Absent posterior tibial and dorsalis pedis pulses bilateral lower extremities. Stage 3 lymphedema. Gastrointestinal (GI) soft, non-tender, non-distended, +BS. no ventral hernia noted. Musculoskeletal Patient unable to walk without assistance. no significant deformity or arthritic changes, no loss or range of motion, no clubbing. Psychiatric this patient is able to make decisions and demonstrates good insight into disease process. Alert and Oriented x 3. pleasant and cooperative. General Notes: Upon inspection today patient does have bilateral stage III lymphedema. She does attempt to elevate her legs as much as possible and  tries to be as active as possible all things considered. With that being said she does have intermittent issues with open wounds although right now I do not see any evidence of open wounds at this point. She has not seen a vascular specialist and initially when I mentioned this was somewhat reluctant to doing so. Other Condition(s) Patient presents with Lymphedema located on the Bilateral  Leg. Assessment Active Problems ICD-10 Lymphedema, not elsewhere classified Other specified peripheral vascular diseases Chronic combined systolic (congestive) and diastolic (congestive) heart failure Essential (primary) hypertension Chronic obstructive pulmonary disease, unspecified Atherosclerotic heart disease of native coronary artery without angina pectoris Rhonda Saunders, Rhonda Saunders. ([id number]) Plan Wound Cleansing: Other: - Dial antibacterial soap, wash wounds, rinse and pat dry Skin Barriers/Peri-Wound Care: Moisturizing lotion Edema Control: Elevate legs to the level of the heart and pump ankles as often as possible Consults ordered were: Vascular - consult for lymphedema At this point my suggestion is that the patient would likely benefit from my referral to vascular to consider whether any interventions would be beneficial for her or more likely if lymphedema pumps would be the best option. Nonetheless I am gonna make this referral for her after discussing it with the patient she eventually agreed to go see the vein and vascular specialist. Subsequently will see her back in the future as needed if anything changes or worsens. She knows she can contact the office as she built in the wounds which he feels like we may need to help her manage. Otherwise I'm hopeful that she will continue to show signs of good improvement. Follow-up as needed Electronic Signature(s) Signed: 02/09/2019 8:15:15 AM By: Worthy Keeler PA-C Entered By: Worthy Keeler on 02/09/2019 08:14:46 Anila, Bojarski Antionette Saunders ([id number]) -------------------------------------------------------------------------------- ROS/PFSH Details Patient Name: Rhonda Saunders. Date of Service: 02/08/2019 2:15 PM Medical Record Number: [id number] Patient Account Number: [id number] Date of Birth/Sex: 02/12/1932 (85 y.o. F) Treating RN: Montey Hora Primary Care Provider: Ramonita Lab Other Clinician: Referring Provider: Ramonita Lab Treating  Provider/Extender: Melburn Hake, Hawthorne Day Weeks in Treatment: 0 Information Obtained From Patient Constitutional Symptoms (General Health) Complaints and Symptoms: Negative for: Fatigue; Fever; Chills; Marked Weight Change Eyes Complaints and Symptoms: Negative for: Dry Eyes; Vision Changes; Glasses / Contacts Ear/Nose/Mouth/Throat Complaints and Symptoms: Negative for: Difficult clearing ears; Sinusitis Medical History: Negative for: Chronic sinus problems/congestion; Middle ear problems Past Medical History Notes: HOH - right ear deaf Hematologic/Lymphatic Complaints and Symptoms: Negative for: Bleeding / Clotting Disorders; Human Immunodeficiency Virus Respiratory Complaints and Symptoms: Negative for: Chronic or frequent coughs; Shortness of Breath Medical History: Positive for: Asthma; Chronic Obstructive Pulmonary Disease (COPD) Negative for: Aspiration; Pneumothorax; Sleep Apnea; Tuberculosis Cardiovascular Complaints and Symptoms: Positive for: LE edema Negative for: Chest pain Medical History: Positive for: Congestive Heart Failure; Coronary Artery Disease; Hypertension Negative for: Angina; Arrhythmia; Deep Vein Thrombosis; Hypotension; Myocardial Infarction; Peripheral Arterial Disease; Peripheral Venous Disease; Phlebitis; Vasculitis Past Medical History Notes: HLD, titanium valve Rhonda Saunders, GLANDON. ([id number]) Gastrointestinal Complaints and Symptoms: Negative for: Frequent diarrhea; Nausea; Vomiting Endocrine Complaints and Symptoms: Negative for: Hepatitis; Thyroid disease; Polydypsia (Excessive Thirst) Medical History: Positive for: Type II Diabetes Negative for: Type I Diabetes Treated with: Diet Genitourinary Complaints and Symptoms: Negative for: Kidney failure/ Dialysis; Incontinence/dribbling Immunological Complaints and Symptoms: Negative for: Hives; Itching Integumentary (Skin) Complaints and Symptoms: Positive for: Swelling Negative for: Wounds;  Bleeding or bruising tendency; Breakdown Medical History: Negative for: History of Burn; History of pressure wounds Musculoskeletal Complaints and Symptoms: Negative for: Muscle Pain; Muscle  Weakness Medical History: Positive for: Osteoarthritis Negative for: Gout; Rheumatoid Arthritis; Osteomyelitis Neurologic Complaints and Symptoms: Negative for: Numbness/parasthesias; Focal/Weakness Medical History: Positive for: Dementia Negative for: Neuropathy; Quadriplegia; Paraplegia; Seizure Disorder Psychiatric Complaints and Symptoms: Negative for: Anxiety; Claustrophobia Immunizations Pneumococcal Vaccine: SHEVONNE, WOLF ([id number]) Received Pneumococcal Vaccination: Yes Implantable Devices None Family and Social History Cancer: No; Diabetes: Yes - Father; Heart Disease: No; Hereditary Spherocytosis: No; Hypertension: No; Kidney Disease: No; Lung Disease: No; Seizures: No; Stroke: No; Thyroid Problems: No; Tuberculosis: No; Never smoker; Marital Status - Widowed; Alcohol Use: Never; Drug Use: No History; Caffeine Use: Rarely; Financial Concerns: No; Food, Clothing or Shelter Needs: No; Support System Lacking: No; Transportation Concerns: No Electronic Signature(s) Signed: 02/08/2019 4:17:58 PM By: Montey Hora Signed: 02/09/2019 8:15:15 AM By: Worthy Keeler PA-C Entered By: Montey Hora on 02/08/2019 14:40:22 [place], Antionette Saunders ([id number]) -------------------------------------------------------------------------------- SuperBill Details Patient Name: Rhonda Saunders. Date of Service: 02/08/2019 Medical Record Number: [id number] Patient Account Number: [id number] Date of Birth/Sex: 1932/06/17 (85 y.o. F) Treating RN: Cornell Barman Primary Care Provider: Ramonita Lab Other Clinician: Referring Provider: Ramonita Lab Treating Provider/Extender: Melburn Hake, Junie Engram Weeks in Treatment: 0 Diagnosis Coding ICD-10 Codes Code Description I89.0 Lymphedema, not elsewhere classified I73.89 Other  specified peripheral vascular diseases I50.42 Chronic combined systolic (congestive) and diastolic (congestive) heart failure I10 Essential (primary) hypertension J44.9 Chronic obstructive pulmonary disease, unspecified I25.10 Atherosclerotic heart disease of native coronary artery without angina pectoris Facility Procedures CPT4 Code: [id number] Description: 99213 - WOUND CARE VISIT-LEV 3 EST PT Modifier: Quantity: 1 Physician Procedures CPT4 Code Description: [id number] WC PHYS LEVEL 3 o NEW PT ICD-10 Diagnosis Description I89.0 Lymphedema, not elsewhere classified I73.89 Other specified peripheral vascular diseases I50.42 Chronic combined systolic (congestive) and diastolic (conge U11  Essential (primary) hypertension Modifier: stive) heart failur Quantity: 1 e Electronic Signature(s) Signed: 02/09/2019 8:15:15 AM By: Worthy Keeler PA-C Entered By: Worthy Keeler on 02/08/2019 22:31:30

## 2019-02-09 NOTE — Progress Notes (Signed)
ACHSAH, MCQUADE ([id number]) Visit Report for 02/08/2019 Allergy List Details Patient Name: Rhonda Saunders, Rhonda Saunders. Date of Service: 02/08/2019 2:15 PM Medical Record Number: [id number] Patient Account Number: [id number] Date of Birth/Sex: Feb 02, 1932 (85 y.o. F) Treating RN: Montey Hora Primary Care Dawson Albers: Ramonita Lab Other Clinician: Referring Aj Crunkleton: Ramonita Lab Treating Masaru Chamberlin/Extender: STONE III, HOYT Weeks in Treatment: 0 Allergies Active Allergies No Known Drug Allergies Allergy Notes Electronic Signature(s) Signed: 02/08/2019 4:17:58 PM By: Montey Hora Entered By: Montey Hora on 02/08/2019 14:35:26 Rhonda Saunders, Rhonda Saunders ([id number]) -------------------------------------------------------------------------------- Arrival Information Details Patient Name: Rhonda Saunders. Date of Service: 02/08/2019 2:15 PM Medical Record Number: [id number] Patient Account Number: [id number] Date of Birth/Sex: Aug 28, 1931 (85 y.o. F) Treating RN: Montey Hora Primary Care Tykee Heideman: Ramonita Lab Other Clinician: Referring Cyan Clippinger: Ramonita Lab Treating Kyara Boxer/Extender: Melburn Hake, HOYT Weeks in Treatment: 0 Visit Information Patient Arrived: Wheel Chair Arrival Time: 14:20 Accompanied By: self Transfer Assistance: Manual Patient Identification Verified: Yes Secondary Verification Process Yes Completed: Patient Has Alerts: Yes Patient Alerts: Patient on Blood Thinner warfarin Electronic Signature(s) Signed: 02/08/2019 4:17:58 PM By: Montey Hora Entered By: Montey Hora on 02/08/2019 14:31:03 Rhonda Saunders ([id number]) -------------------------------------------------------------------------------- Clinic Level of Care Assessment Details Patient Name: Rhonda Saunders. Date of Service: 02/08/2019 2:15 PM Medical Record Number: [id number] Patient Account Number: [id number] Date of Birth/Sex: 05/29/1932 (85 y.o. F) Treating RN: Cornell Barman Primary Care Jesalyn Finazzo: Ramonita Lab Other  Clinician: Referring Norah Devin: Ramonita Lab Treating Prestyn Stanco/Extender: Melburn Hake, HOYT Weeks in Treatment: 0 Clinic Level of Care Assessment Items TOOL 2 Quantity Score []  - Use when only an EandM is performed on the INITIAL visit 0 ASSESSMENTS - Nursing Assessment / Reassessment X - General Physical Exam (combine w/ comprehensive assessment (listed just below) when 1 20 performed on new pt. evals) X- 1 25 Comprehensive Assessment (HX, ROS, Risk Assessments, Wounds Hx, etc.) ASSESSMENTS - Wound and Skin Assessment / Reassessment X - Simple Wound Assessment / Reassessment - one wound 1 5 []  - 0 Complex Wound Assessment / Reassessment - multiple wounds []  - 0 Dermatologic / Skin Assessment (not related to wound area) ASSESSMENTS - Ostomy and/or Continence Assessment and Care []  - Incontinence Assessment and Management 0 []  - 0 Ostomy Care Assessment and Management (repouching, etc.) PROCESS - Coordination of Care X - Simple Patient / Family Education for ongoing care 1 15 []  - 0 Complex (extensive) Patient / Family Education for ongoing care []  - 0 Staff obtains Programmer, systems, Records, Test Results / Process Orders []  - 0 Staff telephones HHA, Nursing Homes / Clarify orders / etc []  - 0 Routine Transfer to another Facility (non-emergent condition) []  - 0 Routine Hospital Admission (non-emergent condition) X- 1 15 New Admissions / Biomedical engineer / Ordering NPWT, Apligraf, etc. []  - 0 Emergency Hospital Admission (emergent condition) X- 1 10 Simple Discharge Coordination []  - 0 Complex (extensive) Discharge Coordination PROCESS - Special Needs []  - Pediatric / Minor Patient Management 0 []  - 0 Isolation Patient Management Rhonda Saunders, Rhonda Saunders. ([id number]) []  - 0 Hearing / Language / Visual special needs []  - 0 Assessment of Community assistance (transportation, D/C planning, etc.) []  - 0 Additional assistance / Altered mentation []  - 0 Support Surface(s) Assessment  (bed, cushion, seat, etc.) INTERVENTIONS - Wound Cleansing / Measurement X - Wound Imaging (photographs - any number of wounds) 1 5 []  - 0 Wound Tracing (instead of photographs) X- 1 5 Simple Wound Measurement - one wound []  - 0 Complex Wound Measurement - multiple wounds X-  1 5 Simple Wound Cleansing - one wound []  - 0 Complex Wound Cleansing - multiple wounds INTERVENTIONS - Wound Dressings []  - Small Wound Dressing one or multiple wounds 0 []  - 0 Medium Wound Dressing one or multiple wounds []  - 0 Large Wound Dressing one or multiple wounds []  - 0 Application of Medications - injection INTERVENTIONS - Miscellaneous []  - External ear exam 0 []  - 0 Specimen Collection (cultures, biopsies, blood, body fluids, etc.) []  - 0 Specimen(s) / Culture(s) sent or taken to Lab for analysis []  - 0 Patient Transfer (multiple staff / Civil Service fast streamer / Similar devices) []  - 0 Simple Staple / Suture removal (25 or less) []  - 0 Complex Staple / Suture removal (26 or more) []  - 0 Hypo / Hyperglycemic Management (close monitor of Blood Glucose) []  - 0 Ankle / Brachial Index (ABI) - do not check if billed separately Has the patient been seen at the hospital within the last three years: Yes Total Score: 105 Level Of Care: New/Established - Level 3 Electronic Signature(s) Unsigned Entered By: Gretta Cool, BSN, RN, CWS, Kim on 02/08/2019 15:15:30 Signature(s): Date(s): Rhonda Saunders ([id number]) -------------------------------------------------------------------------------- Lower Extremity Assessment Details Patient Name: Rhonda Saunders, Rhonda Saunders. Date of Service: 02/08/2019 2:15 PM Medical Record Number: [id number] Patient Account Number: [id number] Date of Birth/Sex: May 27, 1932 (85 y.o. F) Treating RN: Montey Hora Primary Care Deaven Barron: Ramonita Lab Other Clinician: Referring Kanen Mottola: Ramonita Lab Treating Drago Hammonds/Extender: Melburn Hake, HOYT Weeks in Treatment: 0 Edema Assessment Assessed: [Left: No]  [Right: No] Edema: [Left: Yes] [Right: Yes] Calf Left: Right: Point of Measurement: 32 cm From Medial Instep 46.2 cm 45.8 cm Ankle Left: Right: Point of Measurement: 12 cm From Medial Instep 23.5 cm 24.5 cm Vascular Assessment Pulses: Dorsalis Pedis Palpable: [Left:Yes] [Right:Yes] Doppler Audible: [Left:Yes] [Right:Yes] Posterior Tibial Palpable: [Left:No Yes] [Right:No Yes] Notes too painful for pt to complete ABIs Electronic Signature(s) Signed: 02/08/2019 4:17:58 PM By: Montey Hora Entered By: Montey Hora on 02/08/2019 14:50:58 Rhonda Saunders ([id number]) -------------------------------------------------------------------------------- Multi-Disciplinary Care Plan Details Patient Name: Rhonda Saunders, Rhonda Saunders. Date of Service: 02/08/2019 2:15 PM Medical Record Number: [id number] Patient Account Number: [id number] Date of Birth/Sex: Oct 18, 1931 (85 y.o. F) Treating RN: Cornell Barman Primary Care Mariyam Remington: Ramonita Lab Other Clinician: Referring Brittainy Bucker: Ramonita Lab Treating Josephus Harriger/Extender: Melburn Hake, HOYT Weeks in Treatment: 0 Active Inactive Electronic Signature(s) Signed: 02/09/2019 9:10:42 AM By: Gretta Cool, BSN, RN, CWS, Kim RN, BSN Entered By: Gretta Cool, BSN, RN, CWS, Kim on 02/09/2019 09:10:41 Rhonda Saunders ([id number]) -------------------------------------------------------------------------------- Non-Wound Condition Assessment Details Patient Name: Rhonda Saunders, Rhonda Saunders. Date of Service: 02/08/2019 2:15 PM Medical Record Number: [id number] Patient Account Number: [id number] Date of Birth/Sex: 02-23-1932 (85 y.o. F) Treating RN: Montey Hora Primary Care Nimrod Wendt: Ramonita Lab Other Clinician: Referring Shannette Tabares: Ramonita Lab Treating Riane Rung/Extender: STONE III, HOYT Weeks in Treatment: 0 Non-Wound Condition: Condition: Lymphedema Location: Leg Side: Bilateral Photos Electronic Signature(s) Signed: 02/08/2019 4:17:58 PM By: Montey Hora Entered By: Montey Hora on 02/08/2019  14:44:26 Rhonda Saunders, Rhonda Saunders ([id number]) -------------------------------------------------------------------------------- Pain Assessment Details Patient Name: Rhonda Saunders. Date of Service: 02/08/2019 2:15 PM Medical Record Number: [id number] Patient Account Number: [id number] Date of Birth/Sex: [birth year] (85 y.o. F) Treating RN: Montey Hora Primary Care Jovani Flury: Ramonita Lab Other Clinician: Referring Yania Bogie: Ramonita Lab Treating Monasia Lair/Extender: Melburn Hake, HOYT Weeks in Treatment: 0 Active Problems Location of Pain Severity and Description of Pain Patient Has Paino No Site Locations Pain Management and Medication Current Pain Management: Electronic Signature(s) Signed: 02/08/2019 4:17:58 PM By: Montey Hora Entered By:  Montey Hora on 02/08/2019 14:31:11 Rhonda Saunders, Rhonda Saunders ([id number]) -------------------------------------------------------------------------------- Vitals Details Patient Name: Rhonda Saunders, Rhonda Saunders. Date of Service: 02/08/2019 2:15 PM Medical Record Number: [id number] Patient Account Number: [id number] Date of Birth/Sex: [birth year] (85 y.o. F) Treating RN: Montey Hora Primary Care Harmon Bommarito: Ramonita Lab Other Clinician: Referring Shaeleigh Graw: Ramonita Lab Treating Olaoluwa Grieder/Extender: Melburn Hake, HOYT Weeks in Treatment: 0 Vital Signs Time Taken: 14:31 Temperature (F): 98.8 Height (in): 64 Pulse (bpm): 68 Source: Measured Respiratory Rate (breaths/min): 18 Weight (lbs): 217 Blood Pressure (mmHg): 140/57 Source: Measured Reference Range: 80 - 120 mg / dl Body Mass Index (BMI): 37.2 Electronic Signature(s) Signed: 02/08/2019 4:17:58 PM By: Montey Hora Entered By: Montey Hora on 02/08/2019 14:35:10

## 2019-03-11 ENCOUNTER — Ambulatory Visit: Payer: Medicare Other

## 2019-03-16 ENCOUNTER — Encounter (INDEPENDENT_AMBULATORY_CARE_PROVIDER_SITE_OTHER): Payer: Medicare Other | Admitting: Vascular Surgery

## 2019-04-06 ENCOUNTER — Encounter (INDEPENDENT_AMBULATORY_CARE_PROVIDER_SITE_OTHER): Payer: Medicare Other | Admitting: Vascular Surgery

## 2019-04-22 ENCOUNTER — Ambulatory Visit
Admission: RE | Admit: 2019-04-22 | Discharge: 2019-04-22 | Disposition: A | Discharge status: Home / Self Care | Payer: Medicare Other | Source: Ambulatory Visit | Attending: Internal Medicine | Admitting: Internal Medicine

## 2019-04-22 ENCOUNTER — Other Ambulatory Visit: Payer: Self-pay

## 2019-04-22 DIAGNOSIS — R1311 Dysphagia, oral phase: Secondary | ICD-10-CM | POA: Diagnosis present

## 2019-04-22 NOTE — Therapy (Signed)
[place], [place], [address] Phone: [phone]   Fax:     Modified Barium Swallow  Patient Details  Name: Rhonda Saunders MRN: [id number] Date of Birth: 11-07-1931 No data recorded  Encounter Date: 04/22/2019  End of Session - 04/22/19 1517    SLP Start Time  56    SLP Stop Time   1330    SLP Time Calculation (min)  60 min       Past Medical History:  Diagnosis Date  . CHF (congestive heart failure) ([place])   . COPD (chronic obstructive pulmonary disease) ([place])   . Coronary artery disease   . Dementia ([place])   . Diabetes mellitus without complication ([place])   . Hyperlipidemia   . Hypertension     Past Surgical History:  Procedure Laterality Date  . AORTIC VALVE REPLACEMENT (AVR)/CORONARY ARTERY BYPASS GRAFTING (CABG)    . TONSILLECTOMY      There were no vitals filed for this visit.        Subjective: Patient behavior: (alertness, ability to follow instructions, etc.): pt alert, verbally conversive w/ SLP and followed all instructions. She has an extensive medical history including Chronic Pain, Back Pain, Senile dementia without behavioral disturbance, and noted UE Tremors(pt stated was baseline for ~1 year). Pt endorsed Mild s/s of REFLUX behavior and Esophageal Dysmotility d/o "discomfort right here (pointing to are below mid-sternum) when I am eating my meals"; "pressure that hurts sometimes"; and "constant spitting of this mucous and phlegm" -- pt utilized a spit cup before/after this evaluation. Pt stated she felt the phlegm was "coming from my COPD" but suggested she discuss w/ her PCP the possibility of it being REFLUX related. Pt denied any episodes of pnuemonia; neurological dxs. Dentures+ - full set(min loose lower plate). OM exam - WFL.  Chief complaint: dysphagia. Pt endorsed difficulty swallowing Pills (tablets) sometimes and utilizes a puree to aid swallowing w/ a caregiver "who  helps me at night"; but she attempts to swallow Pills w/ water in the mornings/days. Pt has noted a slower oral phase response/reaction when transferring po's to swallow -- this is not consistent w/ all foods/liquids, per pt report.   Objective:  Radiological Procedure: A videoflouroscopic evaluation of oral-preparatory, reflex initiation, and pharyngeal phases of the swallow was performed; as well as a screening of the upper esophageal phase.  I. POSTURE: upright II. VIEW: lateral III. COMPENSATORY STRATEGIES: TIME; alternating foods/liquids for Esophageal clearing  IV. BOLUSES ADMINISTERED:  Thin Liquid: 6 trials   Nectar-thick Liquid: 1 trial  Honey-thick Liquid: NT  Puree: 3 trials  Mechanical Soft: 2 trials V. RESULTS OF EVALUATION: A. ORAL PREPARATORY PHASE: (The lips, tongue, and velum are observed for strength and coordination)       **Overall Severity Rating: WFL-Mild. Pt exhibited an inconsistent, slight-mild hesitation during the A-P transfer of moreso Liquid boluses during the oral phase. This slight delay was less noted w/ trials of increased texture(thicker liquid viscosity, foods). Allowing pt to take her time and follow through w/ swallowing at her Pace/Timing appeared to best Support pt during oral intake, drinking of liquids. Pt stated this had been ongoing for some time("months"); she also endorsed the UE Tremors had increased in the past ~1 year --- unsure if these two issues are somehow related. Pt also has a baseline dx of Dementia which can impact the timing of oral phase, bolus management and coordination of A-P transfer for swallowing.   B.  SWALLOW INITIATION/REFLEX: (The reflex is normal if "triggered" by the time the bolus reached the base of the tongue)  **Overall Severity Rating: [facility]. Timing of the pharyngeal swallow initiation was grossly [facility] for all consistencies. Pharyngeal swallow initiation occurred w/ thin liquids as they spilled from the Valleculae to the  Pyriform Sinuses; Nectar liquids, purees, and soft solids triggered the pharyngeal swallow at the level of the Valleculae. Tight, timely airway closure noted during the swallows. NO aspiration noted.   C. PHARYNGEAL PHASE: (Pharyngeal function is normal if the bolus shows rapid, smooth, and continuous transit through the pharynx and there is no pharyngeal residue after the swallow)  **Overall Severity Rating: [facility]. No significant pharyngeal residue remained post swallows indicating adequate laryngeal excursion and pharyngeal pressure during the swallow. Any slight, diffuse residue(from the oral cavity/Dentures) remaining was cleared w/ a spontaneous, f/u Dry swallow (no cues needed).  D. LARYNGEAL PENETRATION: (Material entering into the laryngeal inlet/vestibule but not aspirated): trace-slight penetration noted w/ 1st bolus trial given(thin liquids) which appeared to clear w/ completion of swallow --- NO further episodes noted. Pt was distracted w/ talking about the drink itself.  E. ASPIRATION: NONE F. ESOPHAGEAL PHASE: (Screening of the upper esophagus): Barium tablet was given in Puree w/ adequate Cervical Esophagus motility HOWEVER noted DISTAL Esophageal dysmotility w/ RETENTION of tablet in Distal Esophagus. This presentation of Esophageal dysmotility might be related to pt's c/o discomfort w/ solid foods at meals.   ASSESSMENT: Pt appears to present w/ grossly adequate oropharyngeal phase swallowing function w/ No significant oropharyngeal phase dysphagia noted during this evaluation today ---- NO aspiration of foods/liquids noted during the study. During the Oral phase, pt exhibited an inconsistent, slight-mild hesitation during the A-P transfer of moreso Liquid boluses during oral phase management. This slight delay was less noted w/ trials of increased texture(thicker liquid viscosity, foods). Allowing pt to take her Time and follow through w/ swallowing at her own Pace/Timing appeared to Best  Support pt during oral intake, and drinking of liquids. Pt stated this had been ongoing for some time("months"); she also endorsed the UE Tremors had increased in the past ~1 year --- unsure if these two issues are somehow related. Pt also has a baseline dx of Dementia which can impact the timing of oral phase, bolus management, and coordination of A-P transfer for swallowing. During the Pharyngeal phase, timing of the pharyngeal swallow initiation was grossly [facility] for all consistencies. Pharyngeal swallow initiation occurred w/ thin liquids as they spilled from the Valleculae to the Pyriform Sinuses; Nectar liquids, purees, and soft solids triggered the pharyngeal swallow at the level of the Valleculae. Tight, timely airway closure noted during the swallows. NO aspiration noted. Trace-slight penetration noted w/ 1st bolus trial given in the study(thin liquids) which appeared to clear w/ completion of swallow --- NO further episodes of laryngeal penetration noted. Pt was distracted w/ talking about the drink itself. This type of presentation can be considered WFL in the aging swallow. No significant pharyngeal residue remained post swallows indicating adequate laryngeal excursion and pharyngeal pressure during the swallow. Any slight, diffuse residue remaining (mostly min oral residue from bolus material around the Dentures) was cleared w/ a spontaneous, f/u Dry swallow (no cues needed). During the Esophageal phase and screening w/ a Barium Tablet given in Puree, adequate Cervical Esophagus motility noted, HOWEVER, DISTAL Esophageal dysmotility w/ RETENTION of tablet noted in Distal Esophagus. This presentation of Esophageal dysmotility might be related to pt's c/o discomfort w/ solid foods at  meals.  PLAN/RECOMMENDATIONS:  A. Diet: more Mech Soft foods (cut, chopped meats moistened well; small cut pieces of foods); Thin liquids. Pills WHOLE in a Puree (applesauce, yogurt)  B. Swallowing Precautions: general  aspiration precautions; REFLUX precautions  C. Recommended consultation to: GI for further Esophageal motility assessment/education if s/s become more uncomfortable/worsen -- pt was not inclined to "see another doctor about anything". Discussed w/ her the aging Esophagus and encouraged her to f/u w/ her PCP about the same for education. Briefly discussed Reflux precautions, handouts.   D. Therapy recommendations: NONE  E. Results and recommendations were discussed w/ pt, video viewed and questions answered.            Oral phase dysphagia - Plan: DG SWALLOW FUNC OP MEDICARE SPEECH PATH, DG SWALLOW FUNC OP MEDICARE SPEECH PATH        Problem List Patient Active Problem List   Diagnosis Date Noted  . Chronic thoracic back pain (Right) 08/06/2018  . Postherpetic neuralgia (Thoracic) (Right) 08/06/2018  . Thoracic radicular pain (Right) 08/06/2018  . Thoracic radiculitis (Right) 08/06/2018  . Elevated C-reactive protein (CRP) 08/03/2018  . Elevated sed rate 08/03/2018  . Chronic upper back pain (Primary Area of Pain) (Right) 07/27/2018  . Neurogenic pain 07/27/2018  . Chronic lower extremity pain [facility] Area of Pain) (Left) 07/27/2018  . Chronic pain syndrome 07/27/2018  . Long term current use of opiate analgesic 07/27/2018  . Pharmacologic therapy 07/27/2018  . Disorder of skeletal system 07/27/2018  . Problems influencing health status 07/27/2018  . Pressure injury of skin 05/16/2018  . Altered mental status   . Palliative care by specialist   . Goals of care, counseling/discussion   . AKI (acute kidney injury) ([place]) 05/13/2018  . Pituitary tumor 02/20/2018  . Senile dementia without behavioral disturbance ([place]) 01/29/2018  . Moderate persistent asthma without complication [id number]  . Pure hypercholesterolemia 12/17/2016  . BMI 40.0-44.9, adult ([place]) 01/04/2016  . Chronic low back pain (Bilateral) (R>L) w/o sciatica 10/06/2015  . Type 2 diabetes, diet controlled  ([place]) 10/06/2015  . Chronic diastolic CHF (congestive heart failure) ([place]) 04/21/2014  . Long term (current) use of anticoagulants ([place]) 04/22/2012  . Atherosclerosis of native coronary artery of native heart 11/26/2011  . Essential hypertension 11/26/2011  . Hearing loss of right ear 11/26/2011  . Hx of aortic valve replacement, mechanical 11/26/2011  . Hyperlipidemia 11/26/2011      Orinda Kenner, [place], CCC-SLP Yazir Koerber 04/22/2019, 3:17 PM  [facility] [place], [place], [address] Phone: [phone]   Fax:     Name: Rhonda Saunders MRN: [id number] Date of Birth: [birth year]

## 2019-05-04 ENCOUNTER — Encounter (INDEPENDENT_AMBULATORY_CARE_PROVIDER_SITE_OTHER): Payer: Medicare Other | Admitting: Vascular Surgery

## 2019-06-01 ENCOUNTER — Encounter (INDEPENDENT_AMBULATORY_CARE_PROVIDER_SITE_OTHER): Payer: Medicare Other | Admitting: Vascular Surgery

## 2019-07-13 ENCOUNTER — Encounter (INDEPENDENT_AMBULATORY_CARE_PROVIDER_SITE_OTHER): Payer: Self-pay | Admitting: Vascular Surgery

## 2019-07-13 ENCOUNTER — Encounter (INDEPENDENT_AMBULATORY_CARE_PROVIDER_SITE_OTHER): Payer: Self-pay

## 2019-07-13 ENCOUNTER — Other Ambulatory Visit: Payer: Self-pay

## 2019-07-13 ENCOUNTER — Ambulatory Visit (INDEPENDENT_AMBULATORY_CARE_PROVIDER_SITE_OTHER): Payer: Medicare Other | Admitting: Vascular Surgery

## 2019-07-13 VITALS — BP 160/71 | HR 80 | Resp 18 | Ht 60.0 in | Wt 211.0 lb

## 2019-07-13 DIAGNOSIS — I890 Lymphedema, not elsewhere classified: Secondary | ICD-10-CM | POA: Diagnosis not present

## 2019-07-13 DIAGNOSIS — E785 Hyperlipidemia, unspecified: Secondary | ICD-10-CM

## 2019-07-13 DIAGNOSIS — I10 Essential (primary) hypertension: Secondary | ICD-10-CM

## 2019-07-13 DIAGNOSIS — E119 Type 2 diabetes mellitus without complications: Secondary | ICD-10-CM

## 2019-07-13 NOTE — Assessment & Plan Note (Signed)
blood pressure control important in reducing the progression of atherosclerotic disease. On appropriate oral medications.  

## 2019-07-13 NOTE — Assessment & Plan Note (Signed)
lipid control important in reducing the progression of atherosclerotic disease. Continue statin therapy  

## 2019-07-13 NOTE — Patient Instructions (Signed)

## 2019-07-13 NOTE — Assessment & Plan Note (Signed)
blood glucose control important in reducing the progression of atherosclerotic disease. Also, involved in wound healing. On appropriate medications.  

## 2019-07-13 NOTE — Assessment & Plan Note (Signed)
The patient clearly has stage III lymphedema with nonpitting edema, skin hardening and progressive fibrosis of her lower legs.  I have recommended a lymphedema pump to try to improve her lower extremity swelling particularly above and below the fibrotic areas.  Rhonda Saunders is an instance where getting on conventional compression stockings would be very difficult but we could certainly consider Velcro compression wraps or other devices.  Elevation of her legs and increasing her activity would also certainly be of benefit.  At this point, Rhonda Saunders does not want to proceed with any therapy including the lymphedema pump for concern over her cardiac history.  I discussed with her the risk of congestive heart failure or volume overload from the lymphedema pump is extremely rare.  I discussed the importance of elevation and compression.  At this point Rhonda Saunders does not want to proceed with any therapy and says Rhonda Saunders will call us if Rhonda Saunders decides to have anything done.

## 2019-07-13 NOTE — Progress Notes (Signed)
Patient ID: Rhonda Saunders, female   DOB: October 27, 1931, 85 y.o.   MRN: [id number]  Chief Complaint  Patient presents with  . New Patient (Initial Visit)    BLE swelling    HPI Rhonda Saunders is a 85 y.o. female.  I am asked to see the patient by Kelli Churn, PA-C for evaluation of LE swelling and lymphedema.  Patient was actually seen by the wound care center few months ago for marked thickening and fibrosis of the skin of her lower leg bilaterally.  She says that the discoloration started after her open heart surgery many years ago and has been steadily progressive.  She clearly has stage III lymphedema with progressive fibrosis of the skin, hard and nonpitting edema of the skin.  She does not have open wounds or ulcers.  No history of cellulitis or infection.  When the wound care center saw her they basically said that since she did not have any open wounds, there was not a lot more from a wound perspective to offer.  Her primary care physician has discussed the lymphedema pump which she has been resistant to using up until this point.  She tried compression stockings in the past without much benefit and at this point, she does not think she could get compression stockings over her thickened skin without tearing it and I think that is actually a reasonable concern.  She does try to elevate her legs.  She says the legs really do not hurt her that much although an occasional pain will come through them.  She does not have a previous history of DVT.  She denies previous cellulitis or infection in the legs.   Past Medical History:  Diagnosis Date  . CHF (congestive heart failure) ([place])   . COPD (chronic obstructive pulmonary disease) ([place])   . Coronary artery disease   . Dementia ([place])   . Diabetes mellitus without complication ([place])   . Hyperlipidemia   . Hypertension     Past Surgical History:  Procedure Laterality Date  . AORTIC VALVE REPLACEMENT (AVR)/CORONARY ARTERY BYPASS GRAFTING (CABG)      . TONSILLECTOMY      Family History No bleeding disorders, clotting disorders, autoimmune diseases, or aneurysms   Social History   Tobacco Use  . Smoking status: Never Smoker  . Smokeless tobacco: Never Used  Substance Use Topics  . Alcohol use: No  . Drug use: Not on file     Allergies  Allergen Reactions  . Pentazocine Other (See Comments)    Other Reaction: Not Assessed     Current Outpatient Medications  Medication Sig Dispense Refill  . atorvastatin (LIPITOR) 80 MG tablet Take 1 tablet by mouth daily.    Marland Kitchen donepezil (ARICEPT) 5 MG tablet Take 5 mg by mouth at bedtime.    . Fluticasone-Salmeterol (ADVAIR DISKUS) 250-50 MCG/DOSE AEPB INHALE 1 PUFF INTO THE LUNGS ONCE DAILY AS DIRECTED    . furosemide (LASIX) 40 MG tablet Take 80 mg by mouth 2 (two) times daily.    Marland Kitchen gabapentin (NEURONTIN) 600 MG tablet Take 0.5 tablets (300 mg total) by mouth 2 (two) times daily. 60 tablet 0  . metoprolol succinate (TOPROL-XL) 25 MG 24 hr tablet Take 1 tablet (25 mg total) by mouth daily. 30 tablet 0  . nitroGLYCERIN (NITROSTAT) 0.4 MG SL tablet Place 0.4 mg under the tongue every 5 (five) minutes as needed for chest pain.    Marland Kitchen nystatin (MYCOSTATIN) 100000 UNIT/ML suspension Take  5 mLs by mouth once.    . nystatin (NYAMYC) powder APPLY TOPICALLY 3 TIMES A DAY    . oxyCODONE (OXYCONTIN) 10 mg 12 hr tablet Take 1 tablet (10 mg total) by mouth every 12 (twelve) hours. 14 tablet 0  . polyethylene glycol (MIRALAX / GLYCOLAX) packet Take 17 g by mouth daily as needed for mild constipation. 14 each 0  . potassium chloride SA (K-DUR,KLOR-CON) 20 MEQ tablet TAKE 1 TABLET BY MOUTH TWICE A DAY.    . ramipril (ALTACE) 10 MG capsule Take 10 mg by mouth daily.    . silver sulfADIAZINE (SILVADENE) 1 % cream Apply topically.    Marland Kitchen tiZANidine (ZANAFLEX) 2 MG tablet Take 2 mg by mouth at bedtime.    Marland Kitchen warfarin (COUMADIN) 5 MG tablet Take 0.5-1 tablets (2.5-5 mg total) by mouth one time only at 6 PM. Take  2.5 mg Monday, wed, Friday and 5 mg the remaining days, and need to adjust the dose per INR. 20 tablet 0  . albuterol (PROVENTIL HFA;VENTOLIN HFA) 108 (90 Base) MCG/ACT inhaler Inhale 2 puffs into the lungs every 6 (six) hours as needed.     No current facility-administered medications for this visit.      REVIEW OF SYSTEMS (Negative unless checked)  Constitutional: [] Weight loss  [] Fever  [] Chills Cardiac: [] Chest pain   [] Chest pressure   [] Palpitations   [] Shortness of breath when laying flat   [] Shortness of breath at rest   [x] Shortness of breath with exertion. Vascular:  [] Pain in legs with walking   [] Pain in legs at rest   [] Pain in legs when laying flat   [] Claudication   [] Pain in feet when walking  [] Pain in feet at rest  [] Pain in feet when laying flat   [] History of DVT   [] Phlebitis   [x] Swelling in legs   [] Varicose veins   [] Non-healing ulcers Pulmonary:   [] Uses home oxygen   [] Productive cough   [] Hemoptysis   [] Wheeze  [x] COPD   [] Asthma Neurologic:  [] Dizziness  [] Blackouts   [] Seizures   [] History of stroke   [] History of TIA  [] Aphasia   [] Temporary blindness   [] Dysphagia   [] Weakness or numbness in arms   [] Weakness or numbness in legs Musculoskeletal:  [x] Arthritis   [] Joint swelling   [] Joint pain   [] Low back pain Hematologic:  [] Easy bruising  [] Easy bleeding   [] Hypercoagulable state   [] Anemic  [] Hepatitis Gastrointestinal:  [] Blood in stool   [] Vomiting blood  [] Gastroesophageal reflux/heartburn   [] Abdominal pain Genitourinary:  [] Chronic kidney disease   [] Difficult urination  [] Frequent urination  [] Burning with urination   [] Hematuria Skin:  [] Rashes   [] Ulcers   [] Wounds Psychological:  [] History of anxiety   []  History of major depression.    Physical Exam BP (!) 160/71 (BP Location: Left Arm)   Pulse 80   Resp 18   Ht 5' (1.524 m)   Wt 211 lb (95.7 kg)   BMI 41.21 kg/m  Gen:  WD/WN, NAD Head: [place]/AT, No temporalis wasting.  Ear/Nose/Throat:  Hearing grossly intact, nares w/o erythema or drainage, oropharynx w/o Erythema/Exudate Eyes: Conjunctiva clear, sclera non-icteric  Neck: trachea midline.  No JVD.  Pulmonary:  Good air movement, respirations not labored, no use of accessory muscles  Cardiac: RRR, no JVD Vascular:  Vessel Right Left  Radial Palpable Palpable  DP  trace  1+  PT  not palpable  not palpable   Gastrointestinal:. No masses, surgical incisions, or scars. Musculoskeletal: M/S 5/5 throughout.  Extremities without ischemic changes.  No deformity or atrophy.  Significant fibrosis and dermal thickening is present in both lower legs.  The skin is hardened and scaled with an elephant-like appearance.  There is only about 1+ lower extremity edema in the lower leg with 2+ swelling in the feet and around the knees above and below the fibrotic areas.  The skin is dry and scaling in both legs but no weeping or open wounds are seen. Neurologic: Sensation grossly intact in extremities.  Symmetrical.  Speech is fluent. Motor exam as listed above.  She does have a noticeable tremor that is more apparent in the right arm than the left. Psychiatric: Judgment intact, Mood & affect appropriate for pt's clinical situation. Dermatologic: No rashes or ulcers noted.  No cellulitis or open wounds.    Radiology No results found.  Labs No results found for this or any previous visit (from the past 2160 hour(s)).  Assessment/Plan:  Essential hypertension blood pressure control important in reducing the progression of atherosclerotic disease. On appropriate oral medications.   Type 2 diabetes, diet controlled (HCC) blood glucose control important in reducing the progression of atherosclerotic disease. Also, involved in wound healing. On appropriate medications.   Hyperlipidemia lipid control important in reducing the progression of atherosclerotic disease. Continue statin therapy   Lymphedema The  patient clearly has stage III lymphedema with nonpitting edema, skin hardening and progressive fibrosis of her lower legs.  I have recommended a lymphedema pump to try to improve her lower extremity swelling particularly above and below the fibrotic areas.  She is an instance where getting on conventional compression stockings would be very difficult but we could certainly consider Velcro compression wraps or other devices.  Elevation of her legs and increasing her activity would also certainly be of benefit.  At this point, she does not want to proceed with any therapy including the lymphedema pump for concern over her cardiac history.  I discussed with her the risk of congestive heart failure or volume overload from the lymphedema pump is extremely rare.  I discussed the importance of elevation and compression.  At this point she does not want to proceed with any therapy and says she will call us if she decides to have anything done.      Leotis Pain 07/13/2019, 11:34 AM   This note was created with Dragon medical transcription system.  Any errors from dictation are unintentional.

## 2020-02-10 DIAGNOSIS — L89152 Pressure ulcer of sacral region, stage 2: Secondary | ICD-10-CM | POA: Insufficient documentation

## 2020-02-15 ENCOUNTER — Encounter (INDEPENDENT_AMBULATORY_CARE_PROVIDER_SITE_OTHER): Payer: Self-pay | Admitting: Vascular Surgery

## 2020-02-15 ENCOUNTER — Other Ambulatory Visit: Payer: Self-pay

## 2020-02-15 ENCOUNTER — Ambulatory Visit (INDEPENDENT_AMBULATORY_CARE_PROVIDER_SITE_OTHER): Payer: Medicare Other | Admitting: Vascular Surgery

## 2020-02-15 VITALS — BP 148/73 | HR 59 | Resp 16 | Ht 63.0 in | Wt 206.0 lb

## 2020-02-15 DIAGNOSIS — E785 Hyperlipidemia, unspecified: Secondary | ICD-10-CM

## 2020-02-15 DIAGNOSIS — E119 Type 2 diabetes mellitus without complications: Secondary | ICD-10-CM | POA: Diagnosis not present

## 2020-02-15 DIAGNOSIS — I890 Lymphedema, not elsewhere classified: Secondary | ICD-10-CM | POA: Diagnosis not present

## 2020-02-15 DIAGNOSIS — I10 Essential (primary) hypertension: Secondary | ICD-10-CM | POA: Diagnosis not present

## 2020-02-15 DIAGNOSIS — L97312 Non-pressure chronic ulcer of right ankle with fat layer exposed: Secondary | ICD-10-CM

## 2020-02-15 MED ORDER — CEPHALEXIN 500 MG PO CAPS: 500.0000 mg | ORAL_CAPSULE | Freq: Three times a day (TID) | ORAL | 0 refills | Status: DC

## 2020-02-15 NOTE — Progress Notes (Signed)
Patient ID: Rhonda Saunders, female   DOB: 07-29-1931, 86 y.o.   MRN: [id number]  Chief Complaint  Patient presents with   New Patient (Initial Visit)     infection venous stasis    HPI Rhonda Saunders is a 86 y.o. female.  I am asked to see the patient by Dr. Caryl Comes for evaluation of significant worsening in her lower extremities.  She has severe lymphedema which we have seen her for and evaluated last year.  Her swelling is actually down, but she has marked worsening in her skin with new ulcerations on the medial aspect of both lower legs, ankle, and proximal foot.  This has a very foul odor to it.  It is somewhat painful to her.  This is affecting both legs and has become a bigger problem.  She already has home health for a sacral decubitus that they are following her for.  There is no clear trauma, injury, or inciting event that started the ulcerations.  This has been steadily worsening over several weeks.  No fever or chills.     Past Medical History:  Diagnosis Date   CHF (congestive heart failure) (HCC)    COPD (chronic obstructive pulmonary disease) (HCC)    Coronary artery disease    Dementia (HCC)    Diabetes mellitus without complication ([place])    Hyperlipidemia    Hypertension     Past Surgical History:  Procedure Laterality Date   AORTIC VALVE REPLACEMENT (AVR)/CORONARY ARTERY BYPASS GRAFTING (CABG)     TONSILLECTOMY       Family History No bleeding disorders, clotting disorders, autoimmune diseases, or aneurysms   Social History   Tobacco Use   Smoking status: Never Smoker   Smokeless tobacco: Never Used  Substance Use Topics   Alcohol use: No   Drug use: Never     Allergies  Allergen Reactions   Pentazocine Other (See Comments)    Other Reaction: Not Assessed     Current Outpatient Medications  Medication Sig Dispense Refill   atorvastatin (LIPITOR) 80 MG tablet Take 1 tablet by mouth daily.     donepezil (ARICEPT) 5 MG tablet Take 5  mg by mouth at bedtime.     Fluticasone-Salmeterol (ADVAIR DISKUS) 250-50 MCG/DOSE AEPB INHALE 1 PUFF INTO THE LUNGS ONCE DAILY AS DIRECTED     furosemide (LASIX) 40 MG tablet Take 80 mg by mouth 2 (two) times daily.     gabapentin (NEURONTIN) 600 MG tablet Take 0.5 tablets (300 mg total) by mouth 2 (two) times daily. 60 tablet 0   metoprolol succinate (TOPROL-XL) 25 MG 24 hr tablet Take 1 tablet (25 mg total) by mouth daily. 30 tablet 0   nitroGLYCERIN (NITROSTAT) 0.4 MG SL tablet Place 0.4 mg under the tongue every 5 (five) minutes as needed for chest pain.     nystatin (MYCOSTATIN) 100000 UNIT/ML suspension Take 5 mLs by mouth once.     nystatin [facility]) powder APPLY TOPICALLY 3 TIMES A DAY     oxyCODONE (OXYCONTIN) 10 mg 12 hr tablet Take 1 tablet (10 mg total) by mouth every 12 (twelve) hours. 14 tablet 0   polyethylene glycol (MIRALAX / GLYCOLAX) packet Take 17 g by mouth daily as needed for mild constipation. 14 each 0   potassium chloride SA (K-DUR,KLOR-CON) 20 MEQ tablet TAKE 1 TABLET BY MOUTH TWICE A DAY.     ramipril (ALTACE) 10 MG capsule Take 10 mg by mouth daily.     silver sulfADIAZINE (  SILVADENE) 1 % cream Apply topically.     tiZANidine (ZANAFLEX) 2 MG tablet Take 2 mg by mouth at bedtime.     warfarin (COUMADIN) 5 MG tablet Take 0.5-1 tablets (2.5-5 mg total) by mouth one time only at 6 PM. Take 2.5 mg Monday, wed, Friday and 5 mg the remaining days, and need to adjust the dose per INR. 20 tablet 0   albuterol (PROVENTIL HFA;VENTOLIN HFA) 108 (90 Base) MCG/ACT inhaler Inhale 2 puffs into the lungs every 6 (six) hours as needed.     No current facility-administered medications for this visit.     REVIEW OF SYSTEMS (Negative unless checked)  Constitutional: [] ?Weight loss  [] ?Fever  [] ?Chills Cardiac: [] ?Chest pain   [] ?Chest pressure   [] ?Palpitations   [] ?Shortness of breath when laying flat   [] ?Shortness of breath at rest   [x] ?Shortness of breath with  exertion. Vascular:  [] ?Pain in legs with walking   [] ?Pain in legs at rest   [] ?Pain in legs when laying flat   [] ?Claudication   [] ?Pain in feet when walking  [] ?Pain in feet at rest  [] ?Pain in feet when laying flat   [] ?History of DVT   [] ?Phlebitis   [x] ?Swelling in legs   [] ?Varicose veins   [x] ?Non-healing ulcers Pulmonary:   [] ?Uses home oxygen   [] ?Productive cough   [] ?Hemoptysis   [] ?Wheeze  [x] ?COPD   [] ?Asthma Neurologic:  [] ?Dizziness  [] ?Blackouts   [] ?Seizures   [] ?History of stroke   [] ?History of TIA  [] ?Aphasia   [] ?Temporary blindness   [] ?Dysphagia   [] ?Weakness or numbness in arms   [] ?Weakness or numbness in legs Musculoskeletal:  [x] ?Arthritis   [] ?Joint swelling   [] ?Joint pain   [] ?Low back pain Hematologic:  [] ?Easy bruising  [] ?Easy bleeding   [] ?Hypercoagulable state   [] ?Anemic  [] ?Hepatitis Gastrointestinal:  [] ?Blood in stool   [] ?Vomiting blood  [] ?Gastroesophageal reflux/heartburn   [] ?Abdominal pain Genitourinary:  [] ?Chronic kidney disease   [] ?Difficult urination  [] ?Frequent urination  [] ?Burning with urination   [] ?Hematuria Skin:  [] ?Rashes   [x] ?Ulcers   [x] ?Wounds Psychological:  [] ?History of anxiety   [] ? History of major depression.    Physical Exam BP (!) 148/73 (BP Location: Left Arm)    Pulse 59    Resp 16    Ht 5\' 3"  (1.6 m)    Wt (!) 206 lb (93.4 kg)    BMI 36.49 kg/m  Gen:  WD/WN, NAD Head: [place]/AT, No temporalis wasting. Ear/Nose/Throat: Hearing grossly intact, nares w/o erythema or drainage, oropharynx w/o Erythema/Exudate Eyes: Conjunctiva clear, sclera non-icteric  Neck: trachea midline.  No JVD.  Pulmonary:  Good air movement, respirations not labored, no use of accessory muscles  Cardiac: RRR, no JVD Vascular:  Vessel Right Left  Radial Palpable Palpable                          DP NP NP  PT NP NP   Musculoskeletal: M/S 5/5 throughout.  Extremities without ischemic changes.  No deformity or atrophy.  Severe stasis dermatitis  changes are present with marked skin thickening bilaterally.  Her lower leg edema is relatively mild in the 1-2+ range, with more pronounced swelling in the upper leg and knee area.  There are large irregular ulcerations with yellowish fibrinous exudate as well as a very foul odor present.  These are present on the medial lower legs, ankle, and proximal foot area on both sides  Neurologic: Sensation grossly intact  in extremities.  Symmetrical.  Speech is fluent. Motor exam as listed above. Psychiatric: Judgment intact, Mood & affect appropriate for pt's clinical situation. Dermatologic: Wounds as described above    Radiology No results found.  Labs No results found for this or any previous visit (from the past 2160 hour(s)).  Assessment/Plan: Essential hypertension blood pressure control important in reducing the progression of atherosclerotic disease. On appropriate oral medications.   Type 2 diabetes, diet controlled (HCC) blood glucose control important in reducing the progression of atherosclerotic disease. Also, involved in wound healing. On appropriate medications.   Hyperlipidemia lipid control important in reducing the progression of atherosclerotic disease. Continue statin therapy  Lower limb ulcer, ankle, right, with fat layer exposed ([place]) The patient has fairly severe ulcerations of both feet and ankles.  This is associated with some fibrinous exudate, a very foul odor, and likely some infection.  We are going to place her in 3 layer Unna boots bilaterally today.  The 3 layer wrap was placed and should be changed weekly.  She has requested home health to do the changes which is certainly reasonable.  We will plan to see her back in about 4 weeks.  I am also going to send her in some antibiotics to try to fight off the infection.  Lymphedema Quite severe.  Stage III.  Were going to get her wrapped in Unna boots at this time to help get the skin healed.  Needs continuous  compression, elevation, and the lymphedema pump going forward.      Leotis Pain 02/15/2020, 9:51 AM   This note was created with Dragon medical transcription system.  Any errors from dictation are unintentional.

## 2020-02-15 NOTE — Assessment & Plan Note (Signed)
The patient has fairly severe ulcerations of both feet and ankles.  This is associated with some fibrinous exudate, a very foul odor, and likely some infection.  We are going to place her in 3 layer Unna boots bilaterally today.  The 3 layer wrap was placed and should be changed weekly.  She has requested home health to do the changes which is certainly reasonable.  We will plan to see her back in about 4 weeks.  I am also going to send her in some antibiotics to try to fight off the infection.

## 2020-02-15 NOTE — Assessment & Plan Note (Signed)
Quite severe.  Stage III.  Were going to get her wrapped in Unna boots at this time to help get the skin healed.  Needs continuous compression, elevation, and the lymphedema pump going forward.

## 2020-02-16 ENCOUNTER — Telehealth (INDEPENDENT_AMBULATORY_CARE_PROVIDER_SITE_OTHER): Payer: Self-pay

## 2020-02-16 NOTE — Telephone Encounter (Signed)
I spoke with the patient son and informed that his mother pain has calm down but it was feeling a burning sensation but by her being a diabetic they was thinking this could be the cause. The patient also informed that the wraps were fine and did not need to be change. The son was informed with medical advice and verbalized understanding.

## 2020-02-16 NOTE — Telephone Encounter (Signed)
Sometimes the unna wraps are uncomfortable. We can bring her into rewrap them or she can use medical grade compression stockings. She can also use tylenol for discomfort

## 2020-03-07 ENCOUNTER — Ambulatory Visit (INDEPENDENT_AMBULATORY_CARE_PROVIDER_SITE_OTHER): Payer: Medicare Other | Admitting: Nurse Practitioner

## 2020-03-23 ENCOUNTER — Other Ambulatory Visit: Payer: Self-pay

## 2020-03-23 ENCOUNTER — Ambulatory Visit (INDEPENDENT_AMBULATORY_CARE_PROVIDER_SITE_OTHER): Payer: Medicare Other | Admitting: Nurse Practitioner

## 2020-03-23 VITALS — BP 135/76 | HR 76 | Ht 63.0 in | Wt 206.0 lb

## 2020-03-23 DIAGNOSIS — I890 Lymphedema, not elsewhere classified: Secondary | ICD-10-CM

## 2020-03-23 DIAGNOSIS — I10 Essential (primary) hypertension: Secondary | ICD-10-CM | POA: Diagnosis not present

## 2020-03-23 DIAGNOSIS — E785 Hyperlipidemia, unspecified: Secondary | ICD-10-CM | POA: Diagnosis not present

## 2020-03-27 ENCOUNTER — Encounter (INDEPENDENT_AMBULATORY_CARE_PROVIDER_SITE_OTHER): Payer: Self-pay | Admitting: Nurse Practitioner

## 2020-03-27 NOTE — Progress Notes (Signed)
Subjective:    Patient ID: Rhonda Saunders, female    DOB: 06/20/1932, 86 y.o.   MRN: [id number] Chief Complaint  Patient presents with  . Follow-up    4 week unna boot check    The patient returns today for evaluation of lower extremity edema and ulceration.  The patient previously had foul-smelling ulcerations with extensive drainage.  The patient and family note that the drainage is significantly decreased as is the odor.  The wound is very nearly healed at this point.  Unfortunately, it is noted that there is no medical grade 1 compression for home.  The patient is very anxious in regards to any touch or movement of her Unna wraps.  She denies any fever, chills, nausea, vomiting or diarrhea.  It is also noted that the family helps to provide care.  The patient has been doing well with interacts provided by home health.   Review of Systems  Cardiovascular: Positive for leg swelling.  Skin: Positive for wound.  Neurological: Positive for weakness.  Psychiatric/Behavioral: The patient is nervous/anxious.   All other systems reviewed and are negative.      Objective:   Physical Exam Vitals reviewed.  HENT:     Head: Normocephalic.  Cardiovascular:     Rate and Rhythm: Normal rate and regular rhythm.     Pulses: Normal pulses.  Pulmonary:     Effort: Pulmonary effort is normal.  Musculoskeletal:     Right lower leg: Edema present.     Left lower leg: Edema present.  Skin:    General: Skin is warm and dry.  Neurological:     Mental Status: She is alert.     Motor: Weakness present.     Gait: Gait abnormal.  Psychiatric:        Mood and Affect: Mood is anxious.        Behavior: Behavior normal.        Thought Content: Thought content normal.        Cognition and Memory: Cognition is impaired.        Judgment: Judgment normal.     BP 135/76   Pulse 76   Ht 5\' 3"  (1.6 m)   Wt 206 lb (93.4 kg)   BMI 36.49 kg/m   Past Medical History:  Diagnosis Date  . CHF  (congestive heart failure) ([place])   . COPD (chronic obstructive pulmonary disease) ([place])   . Coronary artery disease   . Dementia ([place])   . Diabetes mellitus without complication ([place])   . Hyperlipidemia   . Hypertension     Social History   Socioeconomic History  . Marital status: Widowed    Spouse name: Not on file  . Number of children: Not on file  . Years of education: Not on file  . Highest education level: Not on file  Occupational History  . Not on file  Tobacco Use  . Smoking status: Never Smoker  . Smokeless tobacco: Never Used  Substance and Sexual Activity  . Alcohol use: No  . Drug use: Never  . Sexual activity: Not on file  Other Topics Concern  . Not on file  Social History Narrative  . Not on file   Social Determinants of Health   Financial Resource Strain:   . Difficulty of Paying Living Expenses: Not on file  Food Insecurity:   . Worried About Charity fundraiser in the Last Year: Not on file  . Ran Out of Food in  the Last Year: Not on file  Transportation Needs:   . Lack of Transportation (Medical): Not on file  . Lack of Transportation (Non-Medical): Not on file  Physical Activity:   . Days of Exercise per Week: Not on file  . Minutes of Exercise per Session: Not on file  Stress:   . Feeling of Stress : Not on file  Social Connections:   . Frequency of Communication with Friends and Family: Not on file  . Frequency of Social Gatherings with Friends and Family: Not on file  . Attends Religious Services: Not on file  . Active Member of Clubs or Organizations: Not on file  . Attends Archivist Meetings: Not on file  . Marital Status: Not on file  Intimate Partner Violence:   . Fear of Current or Ex-Partner: Not on file  . Emotionally Abused: Not on file  . Physically Abused: Not on file  . Sexually Abused: Not on file    Past Surgical History:  Procedure Laterality Date  . AORTIC VALVE REPLACEMENT (AVR)/CORONARY ARTERY BYPASS  GRAFTING (CABG)    . TONSILLECTOMY      Family History  Problem Relation Age of Onset  . Hypertension Mother   . Heart disease Mother   . Hypertension Father   . Diabetes Father     Allergies  Allergen Reactions  . Pentazocine Other (See Comments)    Other Reaction: Not Assessed        Assessment & Plan:   1. Lymphedema No surgery or intervention at this point in time.    I have had a long discussion with the patient regarding venous insufficiency and why it  causes symptoms, specifically venous ulceration . I have discussed with the patient the chronic skin changes that accompany venous insufficiency and the long term sequela such as infection and recurring  ulceration.  Patient will be placed in [employer] which will be changed weekly drainage permitting.  In addition, behavioral modification including several periods of elevation of the lower extremities during the day will be continued. Achieving a position with the ankles at heart level was stressed to the patient  The patient is instructed to begin routine exercise, especially walking on a daily basis  Patient should undergo duplex ultrasound of the venous system to ensure that DVT or reflux is not present.  Following the review of the ultrasound the patient will follow up in one week to reassess the degree of swelling and the control that Unna therapy is offering.   The patient can be assessed for graduated compression stockings or wraps as well as a Lymph Pump once the ulcers are healed.   2. Essential hypertension Continue antihypertensive medications as already ordered, these medications have been reviewed and there are no changes at this time.   3. Hyperlipidemia, unspecified hyperlipidemia type Continue statin as ordered and reviewed, no changes at this time    Current Outpatient Medications on File Prior to Visit  Medication Sig Dispense Refill  . atorvastatin (LIPITOR) 80 MG tablet Take 1 tablet by mouth  daily.    . cephALEXin (KEFLEX) 500 MG capsule Take 1 capsule (500 mg total) by mouth 3 (three) times daily. 21 capsule 0  . donepezil (ARICEPT) 5 MG tablet Take 5 mg by mouth at bedtime.    . Fluticasone-Salmeterol (ADVAIR DISKUS) 250-50 MCG/DOSE AEPB INHALE 1 PUFF INTO THE LUNGS ONCE DAILY AS DIRECTED    . furosemide (LASIX) 40 MG tablet Take 80 mg by mouth 2 (  two) times daily.    Marland Kitchen gabapentin (NEURONTIN) 600 MG tablet Take 0.5 tablets (300 mg total) by mouth 2 (two) times daily. 60 tablet 0  . metoprolol succinate (TOPROL-XL) 25 MG 24 hr tablet Take 1 tablet (25 mg total) by mouth daily. 30 tablet 0  . nitroGLYCERIN (NITROSTAT) 0.4 MG SL tablet Place 0.4 mg under the tongue every 5 (five) minutes as needed for chest pain.    Marland Kitchen nystatin (MYCOSTATIN) 100000 UNIT/ML suspension Take 5 mLs by mouth once.    . nystatin (NYAMYC) powder APPLY TOPICALLY 3 TIMES A DAY    . oxyCODONE (OXYCONTIN) 10 mg 12 hr tablet Take 1 tablet (10 mg total) by mouth every 12 (twelve) hours. 14 tablet 0  . polyethylene glycol (MIRALAX / GLYCOLAX) packet Take 17 g by mouth daily as needed for mild constipation. 14 each 0  . potassium chloride SA (K-DUR,KLOR-CON) 20 MEQ tablet TAKE 1 TABLET BY MOUTH TWICE A DAY.    . ramipril (ALTACE) 10 MG capsule Take 10 mg by mouth daily.    Marland Kitchen tiZANidine (ZANAFLEX) 2 MG tablet Take 2 mg by mouth at bedtime.    Marland Kitchen warfarin (COUMADIN) 5 MG tablet Take 0.5-1 tablets (2.5-5 mg total) by mouth one time only at 6 PM. Take 2.5 mg Monday, wed, Friday and 5 mg the remaining days, and need to adjust the dose per INR. 20 tablet 0  . albuterol (PROVENTIL HFA;VENTOLIN HFA) 108 (90 Base) MCG/ACT inhaler Inhale 2 puffs into the lungs every 6 (six) hours as needed.     No current facility-administered medications on file prior to visit.    There are no Patient Instructions on file for this visit. No follow-ups on file.   Kris Hartmann, NP

## 2020-04-12 ENCOUNTER — Ambulatory Visit (INDEPENDENT_AMBULATORY_CARE_PROVIDER_SITE_OTHER): Payer: Medicare Other | Admitting: Nurse Practitioner

## 2020-04-20 ENCOUNTER — Other Ambulatory Visit: Payer: Self-pay

## 2020-04-20 ENCOUNTER — Encounter (INDEPENDENT_AMBULATORY_CARE_PROVIDER_SITE_OTHER): Payer: Self-pay | Admitting: Nurse Practitioner

## 2020-04-20 ENCOUNTER — Ambulatory Visit (INDEPENDENT_AMBULATORY_CARE_PROVIDER_SITE_OTHER): Payer: Medicare Other | Admitting: Nurse Practitioner

## 2020-04-20 VITALS — BP 129/56 | HR 62 | Resp 16

## 2020-04-20 DIAGNOSIS — I890 Lymphedema, not elsewhere classified: Secondary | ICD-10-CM | POA: Diagnosis not present

## 2020-04-20 DIAGNOSIS — I10 Essential (primary) hypertension: Secondary | ICD-10-CM | POA: Diagnosis not present

## 2020-04-20 DIAGNOSIS — L97312 Non-pressure chronic ulcer of right ankle with fat layer exposed: Secondary | ICD-10-CM | POA: Diagnosis not present

## 2020-04-20 NOTE — Progress Notes (Signed)
Subjective:    Patient ID: Rhonda Saunders, female    DOB: 08/18/1931, 86 y.o.   MRN: [id number] Chief Complaint  Patient presents with  . Follow-up    4wk unna check    Patient returns today for evaluation of her lower extremity edema and venous ulcerations.  Today the ulcerations have scabbed and healed over.  The patient does have some evidence of dry skin bilaterally but no new venous ulcerations.  The patient notes that the swelling is greatly decreased and it does feel better.  The patient is a somewhat delayed historian however the patient son is present to assist.  He denies any fever, chills, nausea, vomiting or diarrhea.  The only point is her legs are somewhat sore after being wrapped for several weeks.  Overall she is doing much better.   Review of Systems  Cardiovascular: Positive for leg swelling.  Musculoskeletal: Positive for gait problem.  Neurological: Positive for weakness.  Psychiatric/Behavioral: Positive for agitation.  All other systems reviewed and are negative.      Objective:   Physical Exam Vitals reviewed.  HENT:     Head: Normocephalic.  Cardiovascular:     Rate and Rhythm: Normal rate.     Pulses: Decreased pulses.  Pulmonary:     Effort: Pulmonary effort is normal.  Musculoskeletal:     Right lower leg: Edema present.     Left lower leg: Edema present.  Skin:    General: Skin is warm and dry.  Neurological:     Mental Status: She is alert and oriented to person, place, and time.  Psychiatric:        Mood and Affect: Mood normal.        Behavior: Behavior normal.        Thought Content: Thought content normal.        Judgment: Judgment normal.     BP (!) 129/56 (BP Location: Right Arm)   Pulse 62   Resp 16   Past Medical History:  Diagnosis Date  . CHF (congestive heart failure) ([place])   . COPD (chronic obstructive pulmonary disease) ([place])   . Coronary artery disease   . Dementia ([place])   . Diabetes mellitus without complication ([place])    . Hyperlipidemia   . Hypertension     Social History   Socioeconomic History  . Marital status: Widowed    Spouse name: Not on file  . Number of children: Not on file  . Years of education: Not on file  . Highest education level: Not on file  Occupational History  . Not on file  Tobacco Use  . Smoking status: Never Smoker  . Smokeless tobacco: Never Used  Substance and Sexual Activity  . Alcohol use: No  . Drug use: Never  . Sexual activity: Not on file  Other Topics Concern  . Not on file  Social History Narrative  . Not on file   Social Determinants of Health   Financial Resource Strain:   . Difficulty of Paying Living Expenses: Not on file  Food Insecurity:   . Worried About Charity fundraiser in the Last Year: Not on file  . Ran Out of Food in the Last Year: Not on file  Transportation Needs:   . Lack of Transportation (Medical): Not on file  . Lack of Transportation (Non-Medical): Not on file  Physical Activity:   . Days of Exercise per Week: Not on file  . Minutes of Exercise per Session: Not  on file  Stress:   . Feeling of Stress : Not on file  Social Connections:   . Frequency of Communication with Friends and Family: Not on file  . Frequency of Social Gatherings with Friends and Family: Not on file  . Attends Religious Services: Not on file  . Active Member of Clubs or Organizations: Not on file  . Attends Archivist Meetings: Not on file  . Marital Status: Not on file  Intimate Partner Violence:   . Fear of Current or Ex-Partner: Not on file  . Emotionally Abused: Not on file  . Physically Abused: Not on file  . Sexually Abused: Not on file    Past Surgical History:  Procedure Laterality Date  . AORTIC VALVE REPLACEMENT (AVR)/CORONARY ARTERY BYPASS GRAFTING (CABG)    . TONSILLECTOMY      Family History  Problem Relation Age of Onset  . Hypertension Mother   . Heart disease Mother   . Hypertension Father   . Diabetes Father      Allergies  Allergen Reactions  . Pentazocine Other (See Comments)    Other Reaction: Not Assessed        Assessment & Plan:   1. Lymphedema No surgery or intervention at this point in time.  I have reviewed my discussion with the patient regarding venous insufficiency and why it causes symptoms. I have discussed with the patient the chronic skin changes that accompany venous insufficiency and the long term sequela such as ulceration. Patient will contnue wearing graduated compression stockings on a daily basis, as this has provided excellent control of his edema. The patient will put the stockings on first thing in the morning and removing them in the evening. The patient is reminded not to sleep in the stockings.  In addition, behavioral modification including elevation during the day will be initiated.  Patient will follow up in 3 months for evaluation of lower extremity edema or sooner if the ulceration should return.   2. Lower limb ulcer, ankle, right, with fat layer exposed ([place]) This is resolved patient is advised to continue with conservative therapy as outlined above.  3. Essential hypertension Continue antihypertensive medications as already ordered, these medications have been reviewed and there are no changes at this time.    Current Outpatient Medications on File Prior to Visit  Medication Sig Dispense Refill  . atorvastatin (LIPITOR) 80 MG tablet Take 1 tablet by mouth daily.    Marland Kitchen donepezil (ARICEPT) 5 MG tablet Take 5 mg by mouth at bedtime.    . Fluticasone-Salmeterol (ADVAIR DISKUS) 250-50 MCG/DOSE AEPB INHALE 1 PUFF INTO THE LUNGS ONCE DAILY AS DIRECTED    . furosemide (LASIX) 40 MG tablet Take 80 mg by mouth 2 (two) times daily.    Marland Kitchen gabapentin (NEURONTIN) 600 MG tablet Take 0.5 tablets (300 mg total) by mouth 2 (two) times daily. 60 tablet 0  . metoprolol succinate (TOPROL-XL) 25 MG 24 hr tablet Take 1 tablet (25 mg total) by mouth daily. 30 tablet 0  .  nitroGLYCERIN (NITROSTAT) 0.4 MG SL tablet Place 0.4 mg under the tongue every 5 (five) minutes as needed for chest pain.    Marland Kitchen nystatin (MYCOSTATIN) 100000 UNIT/ML suspension Take 5 mLs by mouth once.    . nystatin (NYAMYC) powder APPLY TOPICALLY 3 TIMES A DAY    . oxyCODONE (OXYCONTIN) 10 mg 12 hr tablet Take 1 tablet (10 mg total) by mouth every 12 (twelve) hours. 14 tablet 0  . polyethylene glycol (  MIRALAX / GLYCOLAX) packet Take 17 g by mouth daily as needed for mild constipation. 14 each 0  . potassium chloride SA (K-DUR,KLOR-CON) 20 MEQ tablet TAKE 1 TABLET BY MOUTH TWICE A DAY.    . ramipril (ALTACE) 10 MG capsule Take 10 mg by mouth daily.    Marland Kitchen tiZANidine (ZANAFLEX) 2 MG tablet Take 2 mg by mouth at bedtime.    Marland Kitchen warfarin (COUMADIN) 5 MG tablet Take 0.5-1 tablets (2.5-5 mg total) by mouth one time only at 6 PM. Take 2.5 mg Monday, wed, Friday and 5 mg the remaining days, and need to adjust the dose per INR. 20 tablet 0  . albuterol (PROVENTIL HFA;VENTOLIN HFA) 108 (90 Base) MCG/ACT inhaler Inhale 2 puffs into the lungs every 6 (six) hours as needed.    . cephALEXin (KEFLEX) 500 MG capsule Take 1 capsule (500 mg total) by mouth 3 (three) times daily. (Patient not taking: Reported on 04/20/2020) 21 capsule 0   No current facility-administered medications on file prior to visit.    There are no Patient Instructions on file for this visit. No follow-ups on file.   Kris Hartmann, NP

## 2020-04-21 ENCOUNTER — Ambulatory Visit (INDEPENDENT_AMBULATORY_CARE_PROVIDER_SITE_OTHER): Payer: Medicare Other | Admitting: Nurse Practitioner

## 2020-07-13 ENCOUNTER — Ambulatory Visit (INDEPENDENT_AMBULATORY_CARE_PROVIDER_SITE_OTHER): Payer: Medicare Other | Admitting: Nurse Practitioner

## 2020-08-10 ENCOUNTER — Ambulatory Visit (INDEPENDENT_AMBULATORY_CARE_PROVIDER_SITE_OTHER): Payer: Medicare Other | Admitting: Nurse Practitioner

## 2021-01-09 ENCOUNTER — Other Ambulatory Visit: Payer: Self-pay

## 2021-01-09 ENCOUNTER — Ambulatory Visit (INDEPENDENT_AMBULATORY_CARE_PROVIDER_SITE_OTHER): Payer: Medicare Other | Admitting: Nurse Practitioner

## 2021-01-09 ENCOUNTER — Ambulatory Visit (INDEPENDENT_AMBULATORY_CARE_PROVIDER_SITE_OTHER): Payer: Medicare Other | Admitting: Vascular Surgery

## 2021-01-09 VITALS — BP 189/69 | HR 61 | Ht 62.0 in | Wt 208.0 lb

## 2021-01-09 DIAGNOSIS — E119 Type 2 diabetes mellitus without complications: Secondary | ICD-10-CM

## 2021-01-09 DIAGNOSIS — E785 Hyperlipidemia, unspecified: Secondary | ICD-10-CM | POA: Diagnosis not present

## 2021-01-09 DIAGNOSIS — I10 Essential (primary) hypertension: Secondary | ICD-10-CM | POA: Diagnosis not present

## 2021-01-09 DIAGNOSIS — I890 Lymphedema, not elsewhere classified: Secondary | ICD-10-CM

## 2021-01-09 NOTE — Progress Notes (Signed)
MRN : [id number]  Rhonda Saunders is a 87 y.o. (1932-01-11) female who presents with chief complaint of  Chief Complaint  Patient presents with   Follow-up    Est pt 03/2020  rash on RLE   .  History of Present Illness: Patient returns today in follow up of lymphedema with increasing thick scaling skin present particularly on the right lower extremity.  There is a good bit present on the left as well, but not as much as the right.  The patient denies any pain.  No fevers or chills.  The legs have not been weeping or draining.  Current Outpatient Medications  Medication Sig Dispense Refill   atorvastatin (LIPITOR) 80 MG tablet Take 1 tablet by mouth daily.     cephALEXin (KEFLEX) 500 MG capsule Take 1 capsule (500 mg total) by mouth 3 (three) times daily. 21 capsule 0   donepezil (ARICEPT) 5 MG tablet Take 5 mg by mouth at bedtime.     Fluticasone-Salmeterol (ADVAIR) 250-50 MCG/DOSE AEPB INHALE 1 PUFF INTO THE LUNGS ONCE DAILY AS DIRECTED     furosemide (LASIX) 40 MG tablet Take 80 mg by mouth 2 (two) times daily.     gabapentin (NEURONTIN) 600 MG tablet Take 0.5 tablets (300 mg total) by mouth 2 (two) times daily. 60 tablet 0   gabapentin (NEURONTIN) 600 MG tablet Take by mouth.     levothyroxine (SYNTHROID) 25 MCG tablet Take by mouth.     metoprolol succinate (TOPROL-XL) 25 MG 24 hr tablet Take 1 tablet (25 mg total) by mouth daily. 30 tablet 0   nitroGLYCERIN (NITROSTAT) 0.4 MG SL tablet Place 0.4 mg under the tongue every 5 (five) minutes as needed for chest pain.     nystatin (MYCOSTATIN) 100000 UNIT/ML suspension Take 5 mLs by mouth once.     nystatin (MYCOSTATIN/NYSTOP) powder APPLY TOPICALLY 3 TIMES A DAY     oxyCODONE (OXYCONTIN) 10 mg 12 hr tablet Take 1 tablet (10 mg total) by mouth every 12 (twelve) hours. 14 tablet 0   polyethylene glycol (MIRALAX / GLYCOLAX) packet Take 17 g by mouth daily as needed for mild constipation. 14 each 0   potassium chloride SA (K-DUR,KLOR-CON) 20  MEQ tablet TAKE 1 TABLET BY MOUTH TWICE A DAY.     ramipril (ALTACE) 10 MG capsule Take 10 mg by mouth daily.     tiZANidine (ZANAFLEX) 2 MG tablet Take 2 mg by mouth at bedtime.     triamcinolone cream (KENALOG) 0.1 % Apply topically.     warfarin (COUMADIN) 5 MG tablet Take 0.5-1 tablets (2.5-5 mg total) by mouth one time only at 6 PM. Take 2.5 mg Monday, wed, Friday and 5 mg the remaining days, and need to adjust the dose per INR. 20 tablet 0   albuterol (PROVENTIL HFA;VENTOLIN HFA) 108 (90 Base) MCG/ACT inhaler Inhale 2 puffs into the lungs every 6 (six) hours as needed.     No current facility-administered medications for this visit.    Past Medical History:  Diagnosis Date   CHF (congestive heart failure) ([facility])    COPD (chronic obstructive pulmonary disease) ([facility])    Coronary artery disease    Dementia ([facility])    Diabetes mellitus without complication ([place])    Hyperlipidemia    Hypertension     Past Surgical History:  Procedure Laterality Date   AORTIC VALVE REPLACEMENT (AVR)/CORONARY ARTERY BYPASS GRAFTING (CABG)     TONSILLECTOMY       Social History  Tobacco Use   Smoking status: Never   Smokeless tobacco: Never  Substance Use Topics   Alcohol use: No   Drug use: Never      Family History  Problem Relation Age of Onset   Hypertension Mother    Heart disease Mother    Hypertension Father    Diabetes Father      Allergies  Allergen Reactions   Pentazocine Other (See Comments)    Other Reaction: Not Assessed       REVIEW OF SYSTEMS (Negative unless checked)   Constitutional: [] Weight loss  [] Fever  [] Chills Cardiac: [] Chest pain   [] Chest pressure   [] Palpitations   [] Shortness of breath when laying flat   [] Shortness of breath at rest   [x] Shortness of breath with exertion. Vascular:  [] Pain in legs with walking   [] Pain in legs at rest   [] Pain in legs when laying flat   [] Claudication   [] Pain in feet when walking  [] Pain in feet at rest  [] Pain  in feet when laying flat   [] History of DVT   [] Phlebitis   [x] Swelling in legs   [] Varicose veins   [x] Non-healing ulcers Pulmonary:   [] Uses home oxygen   [] Productive cough   [] Hemoptysis   [] Wheeze  [x] COPD   [] Asthma Neurologic:  [] Dizziness  [] Blackouts   [] Seizures   [] History of stroke   [] History of TIA  [] Aphasia   [] Temporary blindness   [] Dysphagia   [] Weakness or numbness in arms   [] Weakness or numbness in legs Musculoskeletal:  [x] Arthritis   [] Joint swelling   [] Joint pain   [] Low back pain Hematologic:  [] Easy bruising  [] Easy bleeding   [] Hypercoagulable state   [] Anemic  [] Hepatitis Gastrointestinal:  [] Blood in stool   [] Vomiting blood  [] Gastroesophageal reflux/heartburn   [] Abdominal pain Genitourinary:  [] Chronic kidney disease   [] Difficult urination  [] Frequent urination  [] Burning with urination   [] Hematuria Skin:  [] Rashes   [x] Ulcers   [x] Wounds Psychological:  [] History of anxiety   []  History of major depression.  Physical Examination  BP (!) 189/69   Pulse 61   Ht 5\' 2"  (1.575 m)   Wt 208 lb (94.3 kg)   BMI 38.04 kg/m  Gen:  WD/WN, NAD Head: [place]/AT, No temporalis wasting. Ear/Nose/Throat: Hearing grossly intact, nares w/o erythema or drainage Eyes: Conjunctiva clear. Sclera non-icteric Neck: Supple.  Trachea midline Pulmonary:  Good air movement, no use of accessory muscles.  Cardiac: irregular Vascular:  Vessel Right Left  Radial Palpable Palpable           Musculoskeletal: M/S 5/5 throughout.  No deformity or atrophy.  The edema pretty well controlled.  There is a large amount of thick, scaling skin present on both lower extremities worse on the right than the left.  No open wounds or drainage at this point.  No erythema Neurologic: Sensation grossly intact in extremities.  Symmetrical.  Speech is fluent.  Psychiatric: Judgment intact, Mood & affect appropriate for pt's clinical situation. Dermatologic: No rashes or ulcers noted.  No cellulitis or  open wounds.      Labs No results found for this or any previous visit (from the past 2160 hour(s)).  Radiology No results found.  Assessment/Plan Essential hypertension blood pressure control important in reducing the progression of atherosclerotic disease. On appropriate oral medications.     Type 2 diabetes, diet controlled ([facility]) blood glucose control important in reducing the progression of atherosclerotic disease. Also, involved in wound healing. On appropriate  medications.     Hyperlipidemia lipid control important in reducing the progression of atherosclerotic disease. Continue statin therapy  Lymphedema The patient has pretty severe lymphedema, but currently her leg swelling is stable.  She has a whole lot of dry scaling skin, but no weeping or open ulcerations.  We talked about using increased moisturizers multiple times a day if needed.  This is important to keep from developing cracks and ulcerations.  We will plan to see her back in 2 or 3 months to check on her legs and see how she is doing.    Leotis Pain, MD  01/09/2021 11:51 AM    This note was created with Dragon medical transcription system.  Any errors from dictation are purely unintentional

## 2021-01-09 NOTE — Assessment & Plan Note (Signed)
The patient has pretty severe lymphedema, but currently her leg swelling is stable.  She has a whole lot of dry scaling skin, but no weeping or open ulcerations.  We talked about using increased moisturizers multiple times a day if needed.  This is important to keep from developing cracks and ulcerations.  We will plan to see her back in 2 or 3 months to check on her legs and see how she is doing.

## 2021-03-23 DIAGNOSIS — N1831 Chronic kidney disease, stage 3a: Secondary | ICD-10-CM | POA: Diagnosis present

## 2021-04-11 ENCOUNTER — Ambulatory Visit (INDEPENDENT_AMBULATORY_CARE_PROVIDER_SITE_OTHER): Payer: Medicare Other | Admitting: Nurse Practitioner

## 2021-06-08 ENCOUNTER — Encounter (INDEPENDENT_AMBULATORY_CARE_PROVIDER_SITE_OTHER): Payer: Medicare Other | Admitting: Vascular Surgery

## 2021-07-10 ENCOUNTER — Encounter: Payer: Self-pay | Admitting: Emergency Medicine

## 2021-07-10 ENCOUNTER — Other Ambulatory Visit: Payer: Self-pay

## 2021-07-10 DIAGNOSIS — I110 Hypertensive heart disease with heart failure: Secondary | ICD-10-CM | POA: Diagnosis not present

## 2021-07-10 DIAGNOSIS — F0390 Unspecified dementia without behavioral disturbance: Secondary | ICD-10-CM | POA: Diagnosis not present

## 2021-07-10 DIAGNOSIS — Z7951 Long term (current) use of inhaled steroids: Secondary | ICD-10-CM | POA: Diagnosis not present

## 2021-07-10 DIAGNOSIS — Z86018 Personal history of other benign neoplasm: Secondary | ICD-10-CM | POA: Diagnosis not present

## 2021-07-10 DIAGNOSIS — Z955 Presence of coronary angioplasty implant and graft: Secondary | ICD-10-CM | POA: Diagnosis not present

## 2021-07-10 DIAGNOSIS — I2510 Atherosclerotic heart disease of native coronary artery without angina pectoris: Secondary | ICD-10-CM | POA: Insufficient documentation

## 2021-07-10 DIAGNOSIS — R791 Abnormal coagulation profile: Secondary | ICD-10-CM | POA: Insufficient documentation

## 2021-07-10 DIAGNOSIS — J4540 Moderate persistent asthma, uncomplicated: Secondary | ICD-10-CM | POA: Diagnosis not present

## 2021-07-10 DIAGNOSIS — Z7901 Long term (current) use of anticoagulants: Secondary | ICD-10-CM | POA: Diagnosis not present

## 2021-07-10 DIAGNOSIS — I5032 Chronic diastolic (congestive) heart failure: Secondary | ICD-10-CM | POA: Insufficient documentation

## 2021-07-10 DIAGNOSIS — Z79899 Other long term (current) drug therapy: Secondary | ICD-10-CM | POA: Insufficient documentation

## 2021-07-10 DIAGNOSIS — L7622 Postprocedural hemorrhage and hematoma of skin and subcutaneous tissue following other procedure: Secondary | ICD-10-CM | POA: Diagnosis not present

## 2021-07-10 DIAGNOSIS — E119 Type 2 diabetes mellitus without complications: Secondary | ICD-10-CM | POA: Insufficient documentation

## 2021-07-10 NOTE — ED Triage Notes (Signed)
Pt to ED via EMS from home c/o bleeding.  Per son they found the patient had blood in toilet after using tonight, bright red, unsure if rectal or vaginal.  Pt denies pain, weakness or dizziness, or SOB.  Son states patient is on coumadin for prior heart surgery and they have held the doses for last 2 days d/t elevated INR.  Pt has hx of dementia.  Skin color WNL according to son, chest rise even and unlabored, in NAD at this time.

## 2021-07-11 ENCOUNTER — Emergency Department: Payer: Medicare Other

## 2021-07-11 ENCOUNTER — Emergency Department
Admission: EM | Admit: 2021-07-11 | Discharge: 2021-07-13 | Disposition: A | Discharge status: Home / Self Care | Payer: Medicare Other | Attending: Emergency Medicine | Admitting: Emergency Medicine

## 2021-07-11 DIAGNOSIS — T148XXA Other injury of unspecified body region, initial encounter: Secondary | ICD-10-CM

## 2021-07-11 DIAGNOSIS — R791 Abnormal coagulation profile: Secondary | ICD-10-CM

## 2021-07-11 LAB — COMPREHENSIVE METABOLIC PANEL
ALT: 11 U/L (ref 0–44)
AST: 27 U/L (ref 15–41)
Albumin: 2.9 g/dL — ABNORMAL LOW (ref 3.5–5.0)
Alkaline Phosphatase: 76 U/L (ref 38–126)
Anion gap: 5 (ref 5–15)
BUN: 19 mg/dL (ref 8–23)
CO2: 31 mmol/L (ref 22–32)
Calcium: 8.5 mg/dL — ABNORMAL LOW (ref 8.9–10.3)
Chloride: 100 mmol/L (ref 98–111)
Creatinine, Ser: 1.25 mg/dL — ABNORMAL HIGH (ref 0.44–1.00)
GFR, Estimated: 42 mL/min — ABNORMAL LOW (ref >60)
Glucose, Bld: 167 mg/dL — ABNORMAL HIGH (ref 70–99)
Potassium: 4.1 mmol/L (ref 3.5–5.1)
Sodium: 136 mmol/L (ref 135–145)
Total Bilirubin: 0.9 mg/dL (ref 0.3–1.2)
Total Protein: 8.9 g/dL — ABNORMAL HIGH (ref 6.5–8.1)

## 2021-07-11 LAB — CBC
HCT: 36 % (ref 36.0–46.0)
Hemoglobin: 11.7 g/dL — ABNORMAL LOW (ref 12.0–15.0)
MCH: 29.9 pg (ref 26.0–34.0)
MCHC: 32.5 g/dL (ref 30.0–36.0)
MCV: 92.1 fL (ref 80.0–100.0)
Platelets: 202 10*3/uL (ref 150–400)
RBC: 3.91 MIL/uL (ref 3.87–5.11)
RDW: 19 % — ABNORMAL HIGH (ref 11.5–15.5)
WBC: 10.5 10*3/uL (ref 4.0–10.5)
nRBC: 0 % (ref 0.0–0.2)

## 2021-07-11 LAB — TYPE AND SCREEN
ABO/RH(D): O POS
Antibody Screen: NEGATIVE

## 2021-07-11 LAB — TROPONIN I (HIGH SENSITIVITY): Troponin I (High Sensitivity): 12 ng/L (ref <18)

## 2021-07-11 LAB — PROTIME-INR
INR: 5.3 (ref 0.8–1.2)
Prothrombin Time: 48.2 seconds — ABNORMAL HIGH (ref 11.4–15.2)

## 2021-07-11 LAB — HEMOGLOBIN AND HEMATOCRIT, BLOOD
HCT: 36.9 % (ref 36.0–46.0)
Hemoglobin: 11.6 g/dL — ABNORMAL LOW (ref 12.0–15.0)

## 2021-07-11 MED ORDER — SODIUM CHLORIDE 0.9 % IV BOLUS (SEPSIS)
500.0000 mL | Freq: Once | INTRAVENOUS | Status: AC
  Administered 2021-07-11: 02:00:00 500 mL via INTRAVENOUS

## 2021-07-11 MED ORDER — IOHEXOL 350 MG/ML SOLN
75.0000 mL | Freq: Once | INTRAVENOUS | Status: AC | PRN
  Administered 2021-07-11: 02:00:00 75 mL via INTRAVENOUS

## 2021-07-11 MED ORDER — VITAMIN K1 10 MG/ML IJ SOLN
5.0000 mg | Freq: Once | INTRAVENOUS | Status: AC
  Administered 2021-07-11: 03:00:00 5 mg via INTRAVENOUS
  Filled 2021-07-11: qty 0.5

## 2021-07-11 NOTE — ED Notes (Addendum)
Pt still awaiting transfer at this time. Family and patient notified of delay

## 2021-07-11 NOTE — ED Notes (Signed)
EMS transport set up for transfer home. All paperwork completed.

## 2021-07-11 NOTE — ED Notes (Signed)
[facility] called for transport

## 2021-07-11 NOTE — ED Notes (Addendum)
Pt discharge information reviewed. Pt understands importance for need of follow up care, and when to return if symptoms worsen.  Pt understands all information. All questions answered. Pt is alert and oriented with even and regular respirations. Son remains at bedside and understands all information. Pt is to be brought out of department in stretcher back home by EMS transfer.

## 2021-07-11 NOTE — ED Notes (Signed)
Son in room. Patient waiting for EMS to arrive to take her home. Patient respond to verbal stimuli. No signs of distress. Skin warm and dry.

## 2021-07-11 NOTE — ED Notes (Signed)
Pt returned from CT °

## 2021-07-11 NOTE — Discharge Instructions (Signed)
Please continue to hold the Coumadin for the next 2 days and follow-up closely with your primary care doctor.  Her INR today was 5.3.  We have given her 5 mg of IV vitamin K to help reverse this.  I suspect that her bleeding tonight was from several sacral decubitus ulcers.  She has no sign of rectal or vaginal bleeding on exam and a CT of her abdomen pelvis showed no sign of active bleeding either.  She remains hemodynamically stable and her repeat hemoglobin has also been stable 11.7 and 11.6.  If she develops any further bleeding, has chest pain or shortness of breath, abdominal pain, black and tarry stools, coffee-ground vomiting, blood in her vomit or blood in her stool, vaginal bleeding, bleeding from her gums, coughing up blood, please return to the emergency department immediately.

## 2021-07-11 NOTE — ED Notes (Signed)
Vitamin K ordered from pharmacy

## 2021-07-11 NOTE — ED Provider Notes (Signed)
[facility] Emergency Department Provider Note  ____________________________________________   Event Date/Time   First MD Initiated Contact with Patient 07/11/21 0101     (approximate)  I have reviewed the triage vital signs and the nursing notes.   HISTORY  Chief Complaint Rectal Bleeding    HPI Rhonda Saunders is a 87 y.o. female with history of CAD, hypertension, diabetes, CHF, COPD, dementia, aortic valve replacement on Coumadin who presents to the emergency department with complaints of rectal bleeding.  Most of the history is provided by patient's son.  He states that tonight she had 2 episodes where they noticed bright red blood in the toilet.  They think that it may be rectal in nature but are not sure if it could be vaginal.  She denies any chest pain, shortness of breath, abdominal pain.  No fevers, vomiting or diarrhea.  They report that her INR has been elevated and they have been holding her Coumadin for the last 2 days.        Past Medical History:  Diagnosis Date   CHF (congestive heart failure) (HCC)    COPD (chronic obstructive pulmonary disease) (HCC)    Coronary artery disease    Dementia (HCC)    Diabetes mellitus without complication ([place])    Hyperlipidemia    Hypertension     Patient Active Problem List   Diagnosis Date Noted   Lower limb ulcer, ankle, right, with fat layer exposed ([place]) 02/15/2020   Decubitus ulcer of sacral region, stage 2 ([place]) 02/10/2020   Lymphedema 07/13/2019   Chronic thoracic back pain (Right) 08/06/2018   Postherpetic neuralgia (Thoracic) (Right) 08/06/2018   Thoracic radicular pain (Right) 08/06/2018   Thoracic radiculitis (Right) 08/06/2018   Elevated C-reactive protein (CRP) 08/03/2018   Elevated sed rate 08/03/2018   Chronic upper back pain (Primary Area of Pain) (Right) 07/27/2018   Neurogenic pain 07/27/2018   Chronic lower extremity pain (Tertiary Area of Pain) (Left) 07/27/2018   Chronic  pain syndrome 07/27/2018   Long term current use of opiate analgesic 07/27/2018   Pharmacologic therapy 07/27/2018   Disorder of skeletal system 07/27/2018   Problems influencing health status 07/27/2018   Pressure ulcer of ischium, stage 2, unspecified laterality ([place]) 05/16/2018   Altered mental status    Palliative care by specialist    Goals of care, counseling/discussion    AKI (acute kidney injury) ([place]) 05/13/2018   Pituitary tumor 02/20/2018   Senile dementia without behavioral disturbance ([place]) 01/29/2018   Moderate persistent asthma without complication 38/75/6433   Pure hypercholesterolemia 12/17/2016   BMI 35.0-35.9,adult 01/04/2016   Morbid obesity ([place]) 01/04/2016   Chronic low back pain (Bilateral) (R>L) w/o sciatica 10/06/2015   Type 2 diabetes, diet controlled ([place]) 10/06/2015   Chronic diastolic CHF (congestive heart failure) ([place]) 04/21/2014   Long term (current) use of anticoagulants (COUMADIN) 04/22/2012   Atherosclerosis of native coronary artery of native heart 11/26/2011   Essential hypertension 11/26/2011   Hearing loss of right ear 11/26/2011   Hx of aortic valve replacement, mechanical 11/26/2011   Hyperlipidemia 11/26/2011    Past Surgical History:  Procedure Laterality Date   AORTIC VALVE REPLACEMENT (AVR)/CORONARY ARTERY BYPASS GRAFTING (CABG)     TONSILLECTOMY      Prior to Admission medications   Medication Sig Start Date End Date Taking? Authorizing Provider  albuterol (PROVENTIL HFA;VENTOLIN HFA) 108 (90 Base) MCG/ACT inhaler Inhale 2 puffs into the lungs every 6 (six) hours as needed. 01/02/18 07/11/21 Yes  [provider]  atorvastatin (LIPITOR) 80 MG tablet Take 1 tablet by mouth daily. 09/11/16  Yes [provider]  donepezil (ARICEPT) 5 MG tablet Take 5 mg by mouth at bedtime. 03/31/18  Yes [provider]  Fluticasone-Salmeterol (ADVAIR) 250-50 MCG/DOSE AEPB INHALE 1 PUFF INTO THE LUNGS ONCE DAILY AS DIRECTED 11/13/15   Yes [provider]  furosemide (LASIX) 40 MG tablet Take 80 mg by mouth 2 (two) times daily.   Yes [provider]  gabapentin (NEURONTIN) 600 MG tablet Take 0.5 tablets (300 mg total) by mouth 2 (two) times daily. 05/19/18  Yes Vaughan Basta, MD  gabapentin (NEURONTIN) 600 MG tablet Take 600 mg by mouth 2 (two) times daily. 09/19/20  Yes [provider]  levothyroxine (SYNTHROID) 25 MCG tablet Take by mouth. 04/21/20 07/11/21 Yes [provider]  metoprolol succinate (TOPROL-XL) 25 MG 24 hr tablet Take 1 tablet (25 mg total) by mouth daily. 05/20/18  Yes Vaughan Basta, MD  nystatin (MYCOSTATIN/NYSTOP) powder APPLY TOPICALLY 3 TIMES A DAY 05/03/19  Yes [provider]  oxyCODONE (OXYCONTIN) 10 mg 12 hr tablet Take 1 tablet (10 mg total) by mouth every 12 (twelve) hours. 05/19/18  Yes Vaughan Basta, MD  polyethylene glycol (MIRALAX / GLYCOLAX) packet Take 17 g by mouth daily as needed for mild constipation. 05/19/18  Yes Vaughan Basta, MD  potassium chloride SA (K-DUR,KLOR-CON) 20 MEQ tablet TAKE 1 TABLET BY MOUTH TWICE A DAY. 03/11/16  Yes [provider]  ramipril (ALTACE) 10 MG capsule Take 10 mg by mouth daily.   Yes [provider]  tiZANidine (ZANAFLEX) 2 MG tablet Take 2 mg by mouth at bedtime. 01/20/18  Yes [provider]  warfarin (COUMADIN) 5 MG tablet Take 0.5-1 tablets (2.5-5 mg total) by mouth one time only at 6 PM. Take 2.5 mg Monday, wed, Friday and 5 mg the remaining days, and need to adjust the dose per INR. Patient taking differently: Take 5 mg by mouth one time only at 6 PM. 05/21/18  Yes Vaughan Basta, MD  nitroGLYCERIN (NITROSTAT) 0.4 MG SL tablet Place 0.4 mg under the tongue every 5 (five) minutes as needed for chest pain.    [provider]  nystatin (MYCOSTATIN) 100000 UNIT/ML suspension Take 5 mLs by mouth once. Patient not taking: Reported on 07/11/2021     [provider]  triamcinolone cream (KENALOG) 0.1 % Apply topically. Patient not taking: Reported on 07/11/2021 12/07/20   [provider]    Allergies Pentazocine  Family History  Problem Relation Age of Onset   Hypertension Mother    Heart disease Mother    Hypertension Father    Diabetes Father     Social History Social History   Tobacco Use   Smoking status: Never   Smokeless tobacco: Never  Substance Use Topics   Alcohol use: No   Drug use: Never    Review of Systems Constitutional: No fever. Eyes: No visual changes. ENT: No sore throat. Cardiovascular: Denies chest pain. Respiratory: Denies shortness of breath. Gastrointestinal: No nausea, vomiting, diarrhea. Genitourinary: Negative for dysuria. Musculoskeletal: Negative for back pain. Skin: Negative for rash. Neurological: Negative for focal weakness or numbness.  ____________________________________________   PHYSICAL EXAM:  VITAL SIGNS: ED Triage Vitals  Enc Vitals Group     BP 07/10/21 2351 125/83     Pulse Rate 07/10/21 2351 74     Resp 07/10/21 2351 16     Temp 07/10/21 2351 98.2 F (36.8 C)  Temp Source 07/10/21 2351 Oral     SpO2 07/10/21 2351 98 %     Weight 07/10/21 2342 205 lb (93 kg)     Height 07/10/21 2342 5\' 2"  (1.575 m)     Head Circumference --      Peak Flow --      Pain Score 07/10/21 2342 0     Pain Loc --      Pain Edu? --      Excl. in [place]? --    CONSTITUTIONAL: Alert, elderly, in no distress HEAD: Normocephalic EYES: Conjunctivae clear, pupils appear equal, EOM appear intact ENT: normal nose; moist mucous membranes NECK: Supple, normal ROM CARD: RRR; S1 and S2 appreciated; + systolic murmur, no clicks, no rubs, no gallops RESP: Normal chest excursion without splinting or tachypnea; breath sounds clear and equal bilaterally; no wheezes, no rhonchi, no rales, no hypoxia or respiratory distress, speaking full sentences ABD/GI: Normal bowel sounds;  non-distended; soft, non-tender, no rebound, no guarding, no peritoneal signs, no hepatosplenomegaly RECTAL:  Normal rectal tone, no gross blood or melena, brown appearing stool on exam, patient has multiple stage II sacral decubitus ulcers that are oozing a small amount of bright red blood with no surrounding redness, warmth, fluctuance, induration.  No crepitus appreciated.  No hemorrhoids appreciated, nontender rectal exam, no fecal impaction. Chaperone present. BACK: The back appears normal EXT: Normal ROM in all joints; no deformity noted, both lower extremities are wrapped SKIN: Normal color for age and race; warm; no rash on exposed skin NEURO: Moves all extremities equally PSYCH: The patient's mood and manner are appropriate.  ____________________________________________   LABS (all labs ordered are listed, but only abnormal results are displayed)  Labs Reviewed  COMPREHENSIVE METABOLIC PANEL - Abnormal; Notable for the following components:      Result Value   Glucose, Bld 167 (*)    Creatinine, Ser 1.25 (*)    Calcium 8.5 (*)    Total Protein 8.9 (*)    Albumin 2.9 (*)    GFR, Estimated 42 (*)    All other components within normal limits  CBC - Abnormal; Notable for the following components:   Hemoglobin 11.7 (*)    RDW 19.0 (*)    All other components within normal limits  PROTIME-INR - Abnormal; Notable for the following components:   Prothrombin Time 48.2 (*)    INR 5.3 (*)    All other components within normal limits  HEMOGLOBIN AND HEMATOCRIT, BLOOD - Abnormal; Notable for the following components:   Hemoglobin 11.6 (*)    All other components within normal limits  TYPE AND SCREEN  TROPONIN I (HIGH SENSITIVITY)   ____________________________________________  EKG    ____________________________________________  RADIOLOGY I, Isra Lindy, personally viewed and evaluated these images (plain radiographs) as part of my medical decision making, as well as  reviewing the written report by the radiologist.  ED MD interpretation: CT scan shows no signs of active bleeding.  Official radiology report(s): CT ANGIO GI BLEED  Result Date: 07/11/2021 CLINICAL DATA:  Lower gastrointestinal hemorrhage, supra therapeutic anticoagulation EXAM: CTA ABDOMEN AND PELVIS WITHOUT AND WITH CONTRAST TECHNIQUE: Multidetector CT imaging of the abdomen and pelvis was performed using the standard protocol during bolus administration of intravenous contrast. Multiplanar reconstructed images and MIPs were obtained and reviewed to evaluate the vascular anatomy. CONTRAST:  53mL OMNIPAQUE IOHEXOL 350 MG/ML SOLN COMPARISON:  None. FINDINGS: VASCULAR Aorta: Extensive atherosclerotic calcification. No aneurysm or dissection. No periaortic inflammatory change. Celiac: Patent without  evidence of aneurysm, dissection, vasculitis or significant stenosis. SMA: Patent without evidence of aneurysm, dissection, vasculitis or significant stenosis. Renals: Both renal arteries are patent without evidence of aneurysm, dissection, vasculitis, fibromuscular dysplasia or significant stenosis. IMA: Patent without evidence of aneurysm, dissection, vasculitis or significant stenosis. Inflow: Patent without evidence of aneurysm, dissection, vasculitis or significant stenosis. Proximal Outflow: Bilateral common femoral and visualized portions of the superficial and profunda femoral arteries are patent without evidence of aneurysm, dissection, vasculitis or significant stenosis. Veins: No obvious venous abnormality within the limitations of this arterial phase study. Review of the MIP images confirms the above findings. NON-VASCULAR Lower chest: Aortic valve replacement has been performed. Cardiac size is mildly enlarged. Visualized lung bases are clear. Hepatobiliary: Cholelithiasis without pericholecystic inflammatory change. Liver unremarkable. No intra or extrahepatic biliary ductal dilation. Pancreas:  Unremarkable Spleen: Unremarkable Adrenals/Urinary Tract: The adrenal glands are unremarkable. Multiple simple cortical cysts are seen within the kidneys bilaterally. The kidneys are otherwise unremarkable. Bladder is unremarkable. Stomach/Bowel: Mild pancolonic diverticulosis. No superimposed acute inflammatory change. The stomach, small bowel, and large bowel are otherwise unremarkable. No evidence of obstruction or focal inflammation. Appendix absent. No active gastrointestinal hemorrhage identified. No free intraperitoneal gas. Lymphatic: No pathologic adenopathy within the abdomen and pelvis. Reproductive: Multiple calcified, involuted uterine fibroids noted. Pelvic organs are otherwise unremarkable. Other: No abdominal wall hernia or abnormality. No abdominopelvic ascites. Musculoskeletal: No acute bone abnormality. Osseous structures are diffusely osteopenic. There are numerous remote appearing compression deformities noted throughout the lumbar spine. No lytic or blastic bone lesions. IMPRESSION: VASCULAR No acute abnormality. No evidence of hemodynamically significant stenosis, aneurysm, or dissection involving the abdominal and pelvic vasculature. NON-VASCULAR No active gastrointestinal hemorrhage identified. Cholelithiasis. Mild pancolonic diverticulosis without superimposed acute inflammatory change. Osteopenia with multiple remote appearing compression deformities throughout the lumbar spine. No acute bone abnormality. Aortic Atherosclerosis (ICD10-I70.0). Electronically Signed   By: Fidela Salisbury M.D.   On: 07/11/2021 02:50    ____________________________________________   PROCEDURES  Procedure(s) performed (including Critical Care):  Procedures    ____________________________________________   INITIAL IMPRESSION / ASSESSMENT AND PLAN / ED COURSE  As part of my medical decision making, I reviewed the following data within the [place] History obtained from family,  Nursing notes reviewed and incorporated, Labs reviewed ,  Old chart reviewed, CT reviewed, and Notes from prior ED visits         Patient here with concerns for GI versus vaginal bleed.  Patient is hemodynamically stable but INR is supratherapeutic at 5.3.  Her hemoglobin is 11.7 which is actually much better than it was in 2019.  She has multiple stage I sacral decubitus ulcers which I suspect is where the bleeding is coming from as she has no signs of rectal bleeding on exam and has brown appearing stool without hematochezia or melena.  There does not appear to be any vaginal bleeding on external examination.  Will give vitamin K here given supratherapeutic INR with bleeding at home.  Will obtain CTA of the abdomen and pelvis to evaluate for any active GI bleed.  We will continue to monitor closely.  We will repeat H&H to ensure stability.  Given no sign of active bleeding other than small amount of oozing around the sacral ulcers and hemodynamic stability, I do not feel she needs Kcentra at this time.  ED PROGRESS  CT scan shows no acute abnormality and no sign of any active bleeding.  She continues to be hemodynamically stable.  Repeat H&H is also stable.  Discussed findings with patient's son at bedside.  Recommended that she have her INR rechecked in the next 2 days.  Recommend that she continue to hold her Coumadin at this time.  Discussed bleeding return precautions.  She has no active signs of hemorrhaging at this time.  Again I suspect that the bleeding was from these sacral decubitus ulcers.  Again no sign of surrounding infection.  I feel she is safe for discharge home.  Family is comfortable with this plan.   At this time, I do not feel there is any life-threatening condition present. I have reviewed, interpreted and discussed all results (EKG, imaging, lab, urine as appropriate) and exam findings with patient/family. I have reviewed nursing notes and appropriate previous records.  I feel  the patient is safe to be discharged home without further emergent workup and can continue workup as an outpatient as needed. Discussed usual and customary return precautions. Patient/family verbalize understanding and are comfortable with this plan.  Outpatient follow-up has been provided as needed. All questions have been answered.  ____________________________________________   FINAL CLINICAL IMPRESSION(S) / ED DIAGNOSES  Final diagnoses:  Supratherapeutic INR  Bleeding from wound     ED Discharge Orders     None       *Please note:  Rhonda Saunders was evaluated in Emergency Department on 07/11/2021 for the symptoms described in the history of present illness. She was evaluated in the context of the global COVID-19 pandemic, which necessitated consideration that the patient might be at risk for infection with the SARS-CoV-2 virus that causes COVID-19. Institutional protocols and algorithms that pertain to the evaluation of patients at risk for COVID-19 are in a state of rapid change based on information released by regulatory bodies including the CDC and federal and state organizations. These policies and algorithms were followed during the patient's care in the ED.  Some ED evaluations and interventions may be delayed as a result of limited staffing during and the pandemic.*   Note:  This document was prepared using Dragon voice recognition software and may include unintentional dictation errors.    Kham Zuckerman, Delice Bison, DO 07/11/21 [phone]

## 2021-07-25 ENCOUNTER — Other Ambulatory Visit: Payer: Self-pay

## 2021-07-25 ENCOUNTER — Emergency Department: Payer: Medicare Other

## 2021-07-25 DIAGNOSIS — R532 Functional quadriplegia: Secondary | ICD-10-CM | POA: Diagnosis present

## 2021-07-25 DIAGNOSIS — Z20822 Contact with and (suspected) exposure to covid-19: Secondary | ICD-10-CM | POA: Diagnosis present

## 2021-07-25 DIAGNOSIS — L03317 Cellulitis of buttock: Principal | ICD-10-CM | POA: Diagnosis present

## 2021-07-25 DIAGNOSIS — A419 Sepsis, unspecified organism: Secondary | ICD-10-CM

## 2021-07-25 DIAGNOSIS — Z66 Do not resuscitate: Secondary | ICD-10-CM | POA: Diagnosis not present

## 2021-07-25 DIAGNOSIS — L89152 Pressure ulcer of sacral region, stage 2: Secondary | ICD-10-CM | POA: Diagnosis present

## 2021-07-25 DIAGNOSIS — I130 Hypertensive heart and chronic kidney disease with heart failure and stage 1 through stage 4 chronic kidney disease, or unspecified chronic kidney disease: Secondary | ICD-10-CM | POA: Diagnosis present

## 2021-07-25 DIAGNOSIS — E785 Hyperlipidemia, unspecified: Secondary | ICD-10-CM | POA: Diagnosis present

## 2021-07-25 DIAGNOSIS — R791 Abnormal coagulation profile: Secondary | ICD-10-CM | POA: Diagnosis present

## 2021-07-25 DIAGNOSIS — E1122 Type 2 diabetes mellitus with diabetic chronic kidney disease: Secondary | ICD-10-CM | POA: Diagnosis present

## 2021-07-25 DIAGNOSIS — I2510 Atherosclerotic heart disease of native coronary artery without angina pectoris: Secondary | ICD-10-CM | POA: Diagnosis present

## 2021-07-25 DIAGNOSIS — Z952 Presence of prosthetic heart valve: Secondary | ICD-10-CM

## 2021-07-25 DIAGNOSIS — L89613 Pressure ulcer of right heel, stage 3: Secondary | ICD-10-CM | POA: Diagnosis present

## 2021-07-25 DIAGNOSIS — Z833 Family history of diabetes mellitus: Secondary | ICD-10-CM

## 2021-07-25 DIAGNOSIS — E872 Acidosis, unspecified: Secondary | ICD-10-CM | POA: Diagnosis present

## 2021-07-25 DIAGNOSIS — I878 Other specified disorders of veins: Secondary | ICD-10-CM | POA: Diagnosis present

## 2021-07-25 DIAGNOSIS — G9341 Metabolic encephalopathy: Secondary | ICD-10-CM | POA: Diagnosis present

## 2021-07-25 DIAGNOSIS — Z8249 Family history of ischemic heart disease and other diseases of the circulatory system: Secondary | ICD-10-CM

## 2021-07-25 DIAGNOSIS — F0390 Unspecified dementia without behavioral disturbance: Secondary | ICD-10-CM | POA: Diagnosis present

## 2021-07-25 DIAGNOSIS — Z7901 Long term (current) use of anticoagulants: Secondary | ICD-10-CM

## 2021-07-25 DIAGNOSIS — L97312 Non-pressure chronic ulcer of right ankle with fat layer exposed: Secondary | ICD-10-CM | POA: Diagnosis present

## 2021-07-25 DIAGNOSIS — Z79890 Hormone replacement therapy (postmenopausal): Secondary | ICD-10-CM

## 2021-07-25 DIAGNOSIS — G894 Chronic pain syndrome: Secondary | ICD-10-CM | POA: Diagnosis present

## 2021-07-25 DIAGNOSIS — Z515 Encounter for palliative care: Secondary | ICD-10-CM

## 2021-07-25 DIAGNOSIS — D62 Acute posthemorrhagic anemia: Secondary | ICD-10-CM | POA: Diagnosis not present

## 2021-07-25 DIAGNOSIS — L89326 Pressure-induced deep tissue damage of left buttock: Secondary | ICD-10-CM | POA: Diagnosis present

## 2021-07-25 DIAGNOSIS — E1140 Type 2 diabetes mellitus with diabetic neuropathy, unspecified: Secondary | ICD-10-CM | POA: Diagnosis present

## 2021-07-25 DIAGNOSIS — E669 Obesity, unspecified: Secondary | ICD-10-CM | POA: Diagnosis present

## 2021-07-25 DIAGNOSIS — Z951 Presence of aortocoronary bypass graft: Secondary | ICD-10-CM

## 2021-07-25 DIAGNOSIS — N1831 Chronic kidney disease, stage 3a: Secondary | ICD-10-CM | POA: Diagnosis present

## 2021-07-25 DIAGNOSIS — I5032 Chronic diastolic (congestive) heart failure: Secondary | ICD-10-CM | POA: Diagnosis present

## 2021-07-25 DIAGNOSIS — L89316 Pressure-induced deep tissue damage of right buttock: Secondary | ICD-10-CM | POA: Diagnosis present

## 2021-07-25 DIAGNOSIS — L89610 Pressure ulcer of right heel, unstageable: Secondary | ICD-10-CM | POA: Diagnosis present

## 2021-07-25 DIAGNOSIS — S61412A Laceration without foreign body of left hand, initial encounter: Secondary | ICD-10-CM | POA: Diagnosis present

## 2021-07-25 DIAGNOSIS — X58XXXA Exposure to other specified factors, initial encounter: Secondary | ICD-10-CM | POA: Diagnosis present

## 2021-07-25 DIAGNOSIS — Z7401 Bed confinement status: Secondary | ICD-10-CM

## 2021-07-25 DIAGNOSIS — J449 Chronic obstructive pulmonary disease, unspecified: Secondary | ICD-10-CM | POA: Diagnosis present

## 2021-07-25 DIAGNOSIS — Z79899 Other long term (current) drug therapy: Secondary | ICD-10-CM

## 2021-07-25 LAB — CBC WITH DIFFERENTIAL/PLATELET
Abs Immature Granulocytes: 0.05 10*3/uL (ref 0.00–0.07)
Basophils Absolute: 0 10*3/uL (ref 0.0–0.1)
Basophils Relative: 0 %
Eosinophils Absolute: 0.2 10*3/uL (ref 0.0–0.5)
Eosinophils Relative: 1 %
HCT: 37.4 % (ref 36.0–46.0)
Hemoglobin: 11.6 g/dL — ABNORMAL LOW (ref 12.0–15.0)
Immature Granulocytes: 0 %
Lymphocytes Relative: 15 %
Lymphs Abs: 2.1 10*3/uL (ref 0.7–4.0)
MCH: 29.4 pg (ref 26.0–34.0)
MCHC: 31 g/dL (ref 30.0–36.0)
MCV: 94.7 fL (ref 80.0–100.0)
Monocytes Absolute: 0.7 10*3/uL (ref 0.1–1.0)
Monocytes Relative: 5 %
Neutro Abs: 10.7 10*3/uL — ABNORMAL HIGH (ref 1.7–7.7)
Neutrophils Relative %: 79 %
Platelets: 195 10*3/uL (ref 150–400)
RBC: 3.95 MIL/uL (ref 3.87–5.11)
RDW: 19.5 % — ABNORMAL HIGH (ref 11.5–15.5)
WBC: 13.8 10*3/uL — ABNORMAL HIGH (ref 4.0–10.5)
nRBC: 0.1 % (ref 0.0–0.2)

## 2021-07-25 LAB — COMPREHENSIVE METABOLIC PANEL
ALT: 23 U/L (ref 0–44)
AST: 50 U/L — ABNORMAL HIGH (ref 15–41)
Albumin: 2.6 g/dL — ABNORMAL LOW (ref 3.5–5.0)
Alkaline Phosphatase: 71 U/L (ref 38–126)
Anion gap: 8 (ref 5–15)
BUN: 37 mg/dL — ABNORMAL HIGH (ref 8–23)
CO2: 25 mmol/L (ref 22–32)
Calcium: 8.2 mg/dL — ABNORMAL LOW (ref 8.9–10.3)
Chloride: 108 mmol/L (ref 98–111)
Creatinine, Ser: 1.51 mg/dL — ABNORMAL HIGH (ref 0.44–1.00)
GFR, Estimated: 33 mL/min — ABNORMAL LOW (ref >60)
Glucose, Bld: 146 mg/dL — ABNORMAL HIGH (ref 70–99)
Potassium: 3.8 mmol/L (ref 3.5–5.1)
Sodium: 141 mmol/L (ref 135–145)
Total Bilirubin: 0.9 mg/dL (ref 0.3–1.2)
Total Protein: 8.5 g/dL — ABNORMAL HIGH (ref 6.5–8.1)

## 2021-07-25 LAB — LACTIC ACID, PLASMA
Lactic Acid, Venous: 2.6 mmol/L (ref 0.5–1.9)
Lactic Acid, Venous: 2.6 mmol/L (ref 0.5–1.9)

## 2021-07-25 LAB — PROTIME-INR
INR: 3.6 — ABNORMAL HIGH (ref 0.8–1.2)
Prothrombin Time: 35.8 seconds — ABNORMAL HIGH (ref 11.4–15.2)

## 2021-07-25 NOTE — ED Triage Notes (Signed)
Pt comes via EMS sepsis alert with c/o weakness, wound infection, AMS and possible UTI. Foul urine odor and odor from wound. Blood noted from pad under pt when EMS transferred pt to stretcher. Pt has wound noted to right calf and buttocks. Pt has not been seen for wounds.  LKW two weeks ago per home health aid.  T-99.9 BP-176/100 HR-80-104 96 % RA ETCO2-20-24  Pt has dementia and is screaming help me Jesus.  Pt given 500 saline, pitting edema to bilateral legs, 2 IV in place(20 each wrist)

## 2021-07-25 NOTE — ED Notes (Addendum)
RN notified by family member that pt needed assistance. Pt transferred into a stretcher, pt incontinent of urine and stool, linen changed. Pt tolerated well.  Pt has large decubitus ulcers on sacrum, non-blanching redness across buttocks, right buttock has blackened area. Right leg has wound that is wrapped from home. Left hand wrapped in bandage as well.

## 2021-07-26 ENCOUNTER — Other Ambulatory Visit: Payer: Self-pay

## 2021-07-26 ENCOUNTER — Inpatient Hospital Stay
Admission: EM | Admit: 2021-07-26 | Discharge: 2021-08-16 | DRG: 602 | Disposition: A | Discharge status: Skilled Nursing Facility | Payer: Medicare Other | Attending: Internal Medicine | Admitting: Internal Medicine

## 2021-07-26 DIAGNOSIS — D62 Acute posthemorrhagic anemia: Secondary | ICD-10-CM | POA: Diagnosis not present

## 2021-07-26 DIAGNOSIS — I2510 Atherosclerotic heart disease of native coronary artery without angina pectoris: Secondary | ICD-10-CM | POA: Diagnosis present

## 2021-07-26 DIAGNOSIS — Z7901 Long term (current) use of anticoagulants: Secondary | ICD-10-CM

## 2021-07-26 DIAGNOSIS — I5032 Chronic diastolic (congestive) heart failure: Secondary | ICD-10-CM | POA: Diagnosis present

## 2021-07-26 DIAGNOSIS — L8990 Pressure ulcer of unspecified site, unspecified stage: Secondary | ICD-10-CM

## 2021-07-26 DIAGNOSIS — J449 Chronic obstructive pulmonary disease, unspecified: Secondary | ICD-10-CM | POA: Diagnosis present

## 2021-07-26 DIAGNOSIS — L89326 Pressure-induced deep tissue damage of left buttock: Secondary | ICD-10-CM | POA: Diagnosis present

## 2021-07-26 DIAGNOSIS — L089 Local infection of the skin and subcutaneous tissue, unspecified: Secondary | ICD-10-CM

## 2021-07-26 DIAGNOSIS — Z20822 Contact with and (suspected) exposure to covid-19: Secondary | ICD-10-CM | POA: Diagnosis present

## 2021-07-26 DIAGNOSIS — R532 Functional quadriplegia: Secondary | ICD-10-CM

## 2021-07-26 DIAGNOSIS — G9341 Metabolic encephalopathy: Secondary | ICD-10-CM | POA: Diagnosis present

## 2021-07-26 DIAGNOSIS — L89316 Pressure-induced deep tissue damage of right buttock: Secondary | ICD-10-CM | POA: Diagnosis present

## 2021-07-26 DIAGNOSIS — N1831 Chronic kidney disease, stage 3a: Secondary | ICD-10-CM | POA: Diagnosis present

## 2021-07-26 DIAGNOSIS — I10 Essential (primary) hypertension: Secondary | ICD-10-CM | POA: Diagnosis present

## 2021-07-26 DIAGNOSIS — L97312 Non-pressure chronic ulcer of right ankle with fat layer exposed: Secondary | ICD-10-CM | POA: Diagnosis present

## 2021-07-26 DIAGNOSIS — R627 Adult failure to thrive: Secondary | ICD-10-CM | POA: Diagnosis not present

## 2021-07-26 DIAGNOSIS — L89610 Pressure ulcer of right heel, unstageable: Secondary | ICD-10-CM | POA: Diagnosis present

## 2021-07-26 DIAGNOSIS — L89613 Pressure ulcer of right heel, stage 3: Secondary | ICD-10-CM | POA: Diagnosis present

## 2021-07-26 DIAGNOSIS — A419 Sepsis, unspecified organism: Secondary | ICD-10-CM | POA: Diagnosis not present

## 2021-07-26 DIAGNOSIS — E785 Hyperlipidemia, unspecified: Secondary | ICD-10-CM | POA: Diagnosis present

## 2021-07-26 DIAGNOSIS — L03317 Cellulitis of buttock: Secondary | ICD-10-CM

## 2021-07-26 DIAGNOSIS — G894 Chronic pain syndrome: Secondary | ICD-10-CM | POA: Diagnosis present

## 2021-07-26 DIAGNOSIS — R791 Abnormal coagulation profile: Secondary | ICD-10-CM

## 2021-07-26 DIAGNOSIS — L0390 Cellulitis, unspecified: Secondary | ICD-10-CM

## 2021-07-26 DIAGNOSIS — F0390 Unspecified dementia without behavioral disturbance: Secondary | ICD-10-CM | POA: Diagnosis present

## 2021-07-26 DIAGNOSIS — E119 Type 2 diabetes mellitus without complications: Secondary | ICD-10-CM | POA: Diagnosis not present

## 2021-07-26 DIAGNOSIS — Z952 Presence of prosthetic heart valve: Secondary | ICD-10-CM

## 2021-07-26 DIAGNOSIS — L89152 Pressure ulcer of sacral region, stage 2: Secondary | ICD-10-CM | POA: Diagnosis present

## 2021-07-26 DIAGNOSIS — E669 Obesity, unspecified: Secondary | ICD-10-CM | POA: Diagnosis present

## 2021-07-26 DIAGNOSIS — Z515 Encounter for palliative care: Secondary | ICD-10-CM | POA: Diagnosis not present

## 2021-07-26 DIAGNOSIS — Z66 Do not resuscitate: Secondary | ICD-10-CM | POA: Diagnosis not present

## 2021-07-26 DIAGNOSIS — T148XXA Other injury of unspecified body region, initial encounter: Secondary | ICD-10-CM | POA: Diagnosis not present

## 2021-07-26 DIAGNOSIS — E1122 Type 2 diabetes mellitus with diabetic chronic kidney disease: Secondary | ICD-10-CM | POA: Diagnosis present

## 2021-07-26 DIAGNOSIS — E872 Acidosis, unspecified: Secondary | ICD-10-CM | POA: Diagnosis present

## 2021-07-26 DIAGNOSIS — Z7189 Other specified counseling: Secondary | ICD-10-CM | POA: Diagnosis not present

## 2021-07-26 DIAGNOSIS — I130 Hypertensive heart and chronic kidney disease with heart failure and stage 1 through stage 4 chronic kidney disease, or unspecified chronic kidney disease: Secondary | ICD-10-CM | POA: Diagnosis present

## 2021-07-26 DIAGNOSIS — X58XXXA Exposure to other specified factors, initial encounter: Secondary | ICD-10-CM | POA: Diagnosis present

## 2021-07-26 LAB — CBC
HCT: 33.4 % — ABNORMAL LOW (ref 36.0–46.0)
Hemoglobin: 10.2 g/dL — ABNORMAL LOW (ref 12.0–15.0)
MCH: 28.7 pg (ref 26.0–34.0)
MCHC: 30.5 g/dL (ref 30.0–36.0)
MCV: 93.8 fL (ref 80.0–100.0)
Platelets: 180 10*3/uL (ref 150–400)
RBC: 3.56 MIL/uL — ABNORMAL LOW (ref 3.87–5.11)
RDW: 19.3 % — ABNORMAL HIGH (ref 11.5–15.5)
WBC: 14.6 10*3/uL — ABNORMAL HIGH (ref 4.0–10.5)
nRBC: 0.1 % (ref 0.0–0.2)

## 2021-07-26 LAB — GLUCOSE, CAPILLARY: Glucose-Capillary: 104 mg/dL — ABNORMAL HIGH (ref 70–99)

## 2021-07-26 LAB — URINALYSIS, ROUTINE W REFLEX MICROSCOPIC
Bilirubin Urine: NEGATIVE
Glucose, UA: NEGATIVE mg/dL
Hgb urine dipstick: NEGATIVE
Ketones, ur: NEGATIVE mg/dL
Leukocytes,Ua: NEGATIVE
Nitrite: NEGATIVE
Protein, ur: NEGATIVE mg/dL
Specific Gravity, Urine: 1.015 (ref 1.005–1.030)
pH: 5 (ref 5.0–8.0)

## 2021-07-26 LAB — PROTIME-INR
INR: 5.2 (ref 0.8–1.2)
Prothrombin Time: 47.5 seconds — ABNORMAL HIGH (ref 11.4–15.2)

## 2021-07-26 LAB — BASIC METABOLIC PANEL
Anion gap: 7 (ref 5–15)
BUN: 33 mg/dL — ABNORMAL HIGH (ref 8–23)
CO2: 27 mmol/L (ref 22–32)
Calcium: 8.1 mg/dL — ABNORMAL LOW (ref 8.9–10.3)
Chloride: 106 mmol/L (ref 98–111)
Creatinine, Ser: 1.1 mg/dL — ABNORMAL HIGH (ref 0.44–1.00)
GFR, Estimated: 49 mL/min — ABNORMAL LOW (ref >60)
Glucose, Bld: 115 mg/dL — ABNORMAL HIGH (ref 70–99)
Potassium: 3.9 mmol/L (ref 3.5–5.1)
Sodium: 140 mmol/L (ref 135–145)

## 2021-07-26 LAB — PROCALCITONIN: Procalcitonin: 0.12 ng/mL

## 2021-07-26 LAB — CORTISOL-AM, BLOOD: Cortisol - AM: 23.6 ug/dL — ABNORMAL HIGH (ref 6.7–22.6)

## 2021-07-26 LAB — HEMOGLOBIN A1C
Hgb A1c MFr Bld: 5.8 % — ABNORMAL HIGH (ref 4.8–5.6)
Mean Plasma Glucose: 119.76 mg/dL

## 2021-07-26 LAB — CBG MONITORING, ED
Glucose-Capillary: 120 mg/dL — ABNORMAL HIGH (ref 70–99)
Glucose-Capillary: 135 mg/dL — ABNORMAL HIGH (ref 70–99)
Glucose-Capillary: 97 mg/dL (ref 70–99)

## 2021-07-26 LAB — RESP PANEL BY RT-PCR (FLU A&B, COVID) ARPGX2
Influenza A by PCR: NEGATIVE
Influenza B by PCR: NEGATIVE
SARS Coronavirus 2 by RT PCR: NEGATIVE

## 2021-07-26 LAB — LACTIC ACID, PLASMA: Lactic Acid, Venous: 1.7 mmol/L (ref 0.5–1.9)

## 2021-07-26 MED ORDER — METRONIDAZOLE 500 MG/100ML IV SOLN
500.0000 mg | Freq: Once | INTRAVENOUS | Status: DC
  Filled 2021-07-26: qty 100

## 2021-07-26 MED ORDER — FENTANYL CITRATE PF 50 MCG/ML IJ SOSY
50.0000 ug | PREFILLED_SYRINGE | Freq: Once | INTRAMUSCULAR | Status: AC
Start: 2021-07-26 — End: 2021-07-26
  Administered 2021-07-26: 50 ug via INTRAVENOUS
  Filled 2021-07-26: qty 1

## 2021-07-26 MED ORDER — INSULIN ASPART 100 UNIT/ML IJ SOLN: 0.0000 [IU] | Freq: Every morning | INTRAMUSCULAR | Status: DC

## 2021-07-26 MED ORDER — LACTATED RINGERS IV SOLN: INTRAVENOUS | Status: DC

## 2021-07-26 MED ORDER — VANCOMYCIN HCL 750 MG/150ML IV SOLN
750.0000 mg | INTRAVENOUS | Status: DC
  Administered 2021-07-26: 750 mg via INTRAVENOUS
  Filled 2021-07-26: qty 150

## 2021-07-26 MED ORDER — ONDANSETRON HCL 4 MG/2ML IJ SOLN
4.0000 mg | Freq: Once | INTRAMUSCULAR | Status: AC
  Administered 2021-07-26: 4 mg via INTRAVENOUS
  Filled 2021-07-26: qty 2

## 2021-07-26 MED ORDER — MORPHINE SULFATE (PF) 2 MG/ML IV SOLN: 2.0000 mg | INTRAVENOUS | Status: DC | PRN

## 2021-07-26 MED ORDER — ONDANSETRON HCL 4 MG PO TABS: 4.0000 mg | ORAL_TABLET | Freq: Four times a day (QID) | ORAL | Status: DC | PRN

## 2021-07-26 MED ORDER — SODIUM CHLORIDE 0.9 % IV SOLN
1.0000 g | INTRAVENOUS | Status: AC
  Administered 2021-07-26 – 2021-08-01 (×7): 1 g via INTRAVENOUS
  Filled 2021-07-26: qty 1
  Filled 2021-07-26: qty 10
  Filled 2021-07-26 (×5): qty 1

## 2021-07-26 MED ORDER — SODIUM CHLORIDE 0.9 % IV SOLN
2.0000 g | Freq: Once | INTRAVENOUS | Status: AC
  Administered 2021-07-26: 2 g via INTRAVENOUS
  Filled 2021-07-26: qty 2

## 2021-07-26 MED ORDER — ONDANSETRON HCL 4 MG/2ML IJ SOLN: 4.0000 mg | Freq: Four times a day (QID) | INTRAMUSCULAR | Status: DC | PRN

## 2021-07-26 MED ORDER — OXYCODONE HCL 5 MG PO TABS: 5.0000 mg | ORAL_TABLET | ORAL | Status: DC | PRN

## 2021-07-26 MED ORDER — VANCOMYCIN HCL IN DEXTROSE 1-5 GM/200ML-% IV SOLN
1000.0000 mg | Freq: Once | INTRAVENOUS | Status: AC
  Administered 2021-07-26: 1000 mg via INTRAVENOUS
  Filled 2021-07-26: qty 200

## 2021-07-26 MED ORDER — VANCOMYCIN HCL 1000 MG/200ML IV SOLN
1000.0000 mg | INTRAVENOUS | Status: DC
  Filled 2021-07-26: qty 200

## 2021-07-26 MED ORDER — ACETAMINOPHEN 325 MG PO TABS
650.0000 mg | ORAL_TABLET | Freq: Four times a day (QID) | ORAL | Status: DC | PRN
  Administered 2021-07-27 – 2021-08-09 (×8): 650 mg via ORAL
  Filled 2021-07-26 (×8): qty 2

## 2021-07-26 MED ORDER — WARFARIN - PHARMACIST DOSING INPATIENT
Freq: Every day | Status: DC
  Filled 2021-07-26: qty 1

## 2021-07-26 MED ORDER — VANCOMYCIN HCL 1250 MG/250ML IV SOLN
1250.0000 mg | INTRAVENOUS | Status: DC
  Administered 2021-07-28: 1250 mg via INTRAVENOUS
  Filled 2021-07-26: qty 250

## 2021-07-26 MED ORDER — ACETAMINOPHEN 650 MG RE SUPP: 650.0000 mg | Freq: Four times a day (QID) | RECTAL | Status: DC | PRN

## 2021-07-26 MED ORDER — LACTATED RINGERS IV BOLUS
1500.0000 mL | Freq: Once | INTRAVENOUS | Status: AC
Start: 2021-07-26 — End: 2021-07-26
  Administered 2021-07-26: 1500 mL via INTRAVENOUS

## 2021-07-26 MED ORDER — LACTATED RINGERS IV BOLUS
1000.0000 mL | Freq: Once | INTRAVENOUS | Status: AC
  Administered 2021-07-26: 1000 mL via INTRAVENOUS

## 2021-07-26 NOTE — Progress Notes (Signed)
Sepsis tracking by eLINK 

## 2021-07-26 NOTE — Consult Note (Signed)
PHARMACY -  BRIEF ANTIBIOTIC NOTE   Pharmacy has received consult(s) for cefepime and vancomycin from an ED provider.  The patient's profile has been reviewed for ht/wt/allergies/indication/available labs.    One time order(s) placed for  Cefepime 2 gram Vancomycin 1000 mg   Further antibiotics/pharmacy consults should be ordered by admitting physician if indicated.                       Thank you, Dorothe Pea, PharmD, BCPS Clinical Pharmacist   07/26/2021  2:23 AM

## 2021-07-26 NOTE — Progress Notes (Signed)
Notified bedside nurse of need to draw lactic acid.  

## 2021-07-26 NOTE — Progress Notes (Signed)
MD notified of critical INR.

## 2021-07-26 NOTE — Progress Notes (Signed)
ANTICOAGULATION CONSULT NOTE  Pharmacy Consult for warfarin Indication:  mechanical valve  Allergies  Allergen Reactions   Pentazocine Other (See Comments)    Other Reaction: Not Assessed     Patient Measurements: Weight: 83.9 kg (184 lb 15.5 oz)  Vital Signs: Temp: 97.8 F (36.6 C) (01/05 0841) BP: 135/54 (01/05 1550) Pulse Rate: 66 (01/05 1550)  Labs: Recent Labs    07/25/21 1426 07/26/21 0807  HGB 11.6* 10.2*  HCT 37.4 33.4*  PLT 195 180  LABPROT 35.8* 47.5*  INR 3.6* 5.2*  CREATININE 1.51* 1.10*    Estimated Creatinine Clearance: 36.2 mL/min (A) (by C-G formula based on SCr of 1.1 mg/dL (H)).   Medical History: Past Medical History:  Diagnosis Date   CHF (congestive heart failure) (HCC)    COPD (chronic obstructive pulmonary disease) (HCC)    Coronary artery disease    Dementia (HCC)    Diabetes mellitus without complication ([place])    Hyperlipidemia    Hypertension       Assessment: 88 year old female with altered mental status. Patient presented with INR 3.6 that has since trended up. Patient on warfarin for mechanical valve. Per review of Care Everywhere, it looks like she has had several supratherapeutic INRs in the past month.  Date INR Dose 1/4 3.6 5 mg per med rec 1/5 5.2  Goal of Therapy:  INR 2.5-3.5 Monitor platelets by anticoagulation protocol: Yes   Plan:  --INR supratherapeutic, trending up --Discussed with MD, no concerns for bleeding so no need for Vit K at this time --HOLD warfarin tonight --Patient may need decrease in home dose of warfarin going forward --INR with morning labs  Tawnya Crook, PharmD, BCPS Clinical Pharmacist 07/26/2021 5:25 PM

## 2021-07-26 NOTE — Sepsis Progress Note (Signed)
Notified bedside nurse of need to draw repeat lactic acid. 

## 2021-07-26 NOTE — ED Provider Notes (Signed)
[facility] Provider Note    Event Date/Time   First MD Initiated Contact with Patient 07/26/21 0148     (approximate)   History   Code Sepsis   HPI  Rhonda Saunders is a 88 y.o. female with history of CHF, COPD, hypertension, diabetes, hyperlipidemia, dementia, aortic valve replacement on Coumadin who presents to the emergency department with her son for concerns for couple of days of generalized weakness, worsening mental status, subjective fevers.  He also reports that she has wounds to both of her legs and to her buttocks and he is worried they are infected.  There has been foul-smelling drainage.  She is complaining of pain to both of these areas.  No complaints of chest pain or shortness of breath.  No vomiting or diarrhea.  No known falls.  Lives at home with her family.   History provided by patient and son at bedside.  Past Medical History:  Diagnosis Date   CHF (congestive heart failure) (HCC)    COPD (chronic obstructive pulmonary disease) (HCC)    Coronary artery disease    Dementia (HCC)    Diabetes mellitus without complication ([place])    Hyperlipidemia    Hypertension     Past Surgical History:  Procedure Laterality Date   AORTIC VALVE REPLACEMENT (AVR)/CORONARY ARTERY BYPASS GRAFTING (CABG)     TONSILLECTOMY      MEDICATIONS:  Prior to Admission medications   Medication Sig Start Date End Date Taking? Authorizing Provider  albuterol (PROVENTIL HFA;VENTOLIN HFA) 108 (90 Base) MCG/ACT inhaler Inhale 2 puffs into the lungs every 6 (six) hours as needed. 01/02/18 07/11/21  [provider]  atorvastatin (LIPITOR) 80 MG tablet Take 1 tablet by mouth daily. 09/11/16   [provider]  donepezil (ARICEPT) 5 MG tablet Take 5 mg by mouth at bedtime. 03/31/18   [provider]  Fluticasone-Salmeterol (ADVAIR) 250-50 MCG/DOSE AEPB INHALE 1 PUFF INTO THE LUNGS ONCE DAILY AS DIRECTED 11/13/15   [provider]   furosemide (LASIX) 40 MG tablet Take 80 mg by mouth 2 (two) times daily.    [provider]  gabapentin (NEURONTIN) 600 MG tablet Take 0.5 tablets (300 mg total) by mouth 2 (two) times daily. 05/19/18   Vaughan Basta, MD  gabapentin (NEURONTIN) 600 MG tablet Take 600 mg by mouth 2 (two) times daily. 09/19/20   [provider]  levothyroxine (SYNTHROID) 25 MCG tablet Take by mouth. 04/21/20 07/11/21  [provider]  metoprolol succinate (TOPROL-XL) 25 MG 24 hr tablet Take 1 tablet (25 mg total) by mouth daily. 05/20/18   Vaughan Basta, MD  nitroGLYCERIN (NITROSTAT) 0.4 MG SL tablet Place 0.4 mg under the tongue every 5 (five) minutes as needed for chest pain.    [provider]  nystatin (MYCOSTATIN) 100000 UNIT/ML suspension Take 5 mLs by mouth once. Patient not taking: Reported on 07/11/2021    [provider]  nystatin (MYCOSTATIN/NYSTOP) powder APPLY TOPICALLY 3 TIMES A DAY 05/03/19   [provider]  oxyCODONE (OXYCONTIN) 10 mg 12 hr tablet Take 1 tablet (10 mg total) by mouth every 12 (twelve) hours. 05/19/18   Vaughan Basta, MD  polyethylene glycol (MIRALAX / GLYCOLAX) packet Take 17 g by mouth daily as needed for mild constipation. 05/19/18   Vaughan Basta, MD  potassium chloride SA (K-DUR,KLOR-CON) 20 MEQ tablet TAKE 1 TABLET BY MOUTH TWICE A DAY. 03/11/16   [provider]  ramipril (ALTACE) 10 MG capsule Take 10 mg  by mouth daily.    [provider]  tiZANidine (ZANAFLEX) 2 MG tablet Take 2 mg by mouth at bedtime. 01/20/18   [provider]  triamcinolone cream (KENALOG) 0.1 % Apply topically. Patient not taking: Reported on 07/11/2021 12/07/20   [provider]  warfarin (COUMADIN) 5 MG tablet Take 0.5-1 tablets (2.5-5 mg total) by mouth one time only at 6 PM. Take 2.5 mg Monday, wed, Friday and 5 mg the remaining days, and need to adjust the dose per INR. Patient  taking differently: Take 5 mg by mouth one time only at 6 PM. 05/21/18   Vaughan Basta, MD    Physical Exam   Triage Vital Signs: ED Triage Vitals  Enc Vitals Group     BP 07/25/21 1427 (!) 172/60     Pulse Rate 07/25/21 1427 87     Resp 07/25/21 1427 17     Temp 07/25/21 1427 98.2 F (36.8 C)     Temp Source 07/25/21 1427 Oral     SpO2 07/25/21 1427 96 %     Weight --      Height --      Head Circumference --      Peak Flow --      Pain Score 07/25/21 1403 6     Pain Loc --      Pain Edu? --      Excl. in [place]? --     Most recent vital signs: Vitals:   07/26/21 0307 07/26/21 0330  BP:  (!) 157/75  Pulse:  87  Resp:    Temp: 98.2 F (36.8 C)   SpO2:  96%    CONSTITUTIONAL: Alert, demented, unable to answer questions, moaning in pain, nontoxic in appearance, elderly HEAD: Normocephalic, atraumatic EYES: Conjunctivae clear, pupils appear equal, sclera nonicteric ENT: normal nose; moist mucous membranes NECK: Supple, normal ROM CARD: RRR; S1 and S2 appreciated; no murmurs, no clicks, no rubs, no gallops RESP: Normal chest excursion without splinting or tachypnea; breath sounds clear and equal bilaterally; no wheezes, no rhonchi, no rales, no hypoxia or respiratory distress, speaking full sentences ABD/GI: Normal bowel sounds; non-distended; soft, non-tender, no rebound, no guarding, no peritoneal signs BACK: The back appears normal BUTTOCKS: Patient has stage I-II sacral decubitus ulcers with surrounding redness and warmth but no crepitus, abscess, fluctuance, necrosis noted EXT: Peripheral edema noted to her bilateral lower extremities.  Both legs are wrapped tightly with dressings and are extremely tender to palpation.  Once legs were undressed she has multiple areas of skin breakdown with foul-smelling drainage to both lower extremities.  Compartments seem soft.  Normal capillary refill in her toes. SKIN: Normal color for age and race; warm; no rash on exposed  skin NEURO: Moves all extremities equally, normal speech PSYCH: The patient's mood and manner are appropriate.   ED Results / Procedures / Treatments   LABS: (all labs ordered are listed, but only abnormal results are displayed) Labs Reviewed  COMPREHENSIVE METABOLIC PANEL - Abnormal; Notable for the following components:      Result Value   Glucose, Bld 146 (*)    BUN 37 (*)    Creatinine, Ser 1.51 (*)    Calcium 8.2 (*)    Total Protein 8.5 (*)    Albumin 2.6 (*)    AST 50 (*)    GFR, Estimated 33 (*)    All other components within normal limits  LACTIC ACID, PLASMA - Abnormal; Notable for the following components:   Lactic  Acid, Venous 2.6 (*)    All other components within normal limits  LACTIC ACID, PLASMA - Abnormal; Notable for the following components:   Lactic Acid, Venous 2.6 (*)    All other components within normal limits  CBC WITH DIFFERENTIAL/PLATELET - Abnormal; Notable for the following components:   WBC 13.8 (*)    Hemoglobin 11.6 (*)    RDW 19.5 (*)    Neutro Abs 10.7 (*)    All other components within normal limits  PROTIME-INR - Abnormal; Notable for the following components:   Prothrombin Time 35.8 (*)    INR 3.6 (*)    All other components within normal limits  RESP PANEL BY RT-PCR (FLU A&B, COVID) ARPGX2  CULTURE, BLOOD (ROUTINE X 2)  CULTURE, BLOOD (ROUTINE X 2)  URINE CULTURE  PROCALCITONIN  URINALYSIS, ROUTINE W REFLEX MICROSCOPIC  HEMOGLOBIN A1C  PROTIME-INR  CORTISOL-AM, BLOOD  BASIC METABOLIC PANEL  CBC  LACTIC ACID, PLASMA  LACTIC ACID, PLASMA     EKG:  EKG Interpretation  Date/Time:  Wednesday July 25 2021 14:22:19 EST Ventricular Rate:  87 PR Interval:  162 QRS Duration: 102 QT Interval:  388 QTC Calculation: 466 R Axis:   71 Text Interpretation: Sinus rhythm with Premature atrial complexes with Abberant conduction Nonspecific ST abnormality Abnormal ECG When compared with ECG of 13-May-2018 18:28, PREVIOUS ECG IS  PRESENT Confirmed by Pryor Curia [phone]) on 07/26/2021 3:25:41 AM          RADIOLOGY: My personal review and interpretation: Chest x-ray clear.  Radiologist reports reviewed by myself: No focal consolidation or large pleural effusion.   PROCEDURES:  Critical Care performed: Yes, see critical care procedure note(s)   CRITICAL CARE Performed by: Pryor Curia   Total critical care time: 45 minutes  Critical care time was exclusive of separately billable procedures and treating other patients.  Critical care was necessary to treat or prevent imminent or life-threatening deterioration.  Critical care was time spent personally by me on the following activities: development of treatment plan with patient and/or surrogate as well as nursing, discussions with consultants, evaluation of patient's response to treatment, examination of patient, obtaining history from patient or surrogate, ordering and performing treatments and interventions, ordering and review of laboratory studies, ordering and review of radiographic studies, pulse oximetry and re-evaluation of patient's condition.   Marland Kitchen1-3 Lead EKG Interpretation Performed by: Kierah Goatley, Delice Bison, DO Authorized by: Meliza Kage, Delice Bison, DO     Interpretation: normal     ECG rate:  88   ECG rate assessment: normal     Rhythm: sinus rhythm     Ectopy: none     Conduction: normal      IMPRESSION / MDM / ASSESSMENT AND PLAN / ED COURSE  I reviewed the triage vital signs and the nursing notes.    Patient here with her son for concerns for subjective fevers and possible cellulitis to her legs and buttocks.  The patient is on the cardiac monitor to evaluate for evidence of arrhythmia and/or significant heart rate changes.   DIFFERENTIAL DIAGNOSIS (includes but not limited to):   Sepsis, bacteremia, cellulitis, abscess, necrotizing fasciitis, UTI, pneumonia   PLAN: We will obtain labs, cultures, chest x-ray, COVID and flu swab, urine.   Will give 30 mL/kg IV fluid bolus and broad-spectrum antibiotics.  Patient has extensive cellulitis to her buttocks but no sign of abscess, crepitus, necrosis on exam.  I feel she will need admission.  She feels warm to touch here.  Will obtain rectal temperature.  Patient moaning in pain.  Will give IV fentanyl.   MEDICATIONS GIVEN IN ED: Medications  lactated ringers infusion (has no administration in time range)  vancomycin (VANCOCIN) IVPB 1000 mg/200 mL premix (1,000 mg Intravenous New Bag/Given 07/26/21 0350)  insulin aspart (novoLOG) injection 0-15 Units (has no administration in time range)  acetaminophen (TYLENOL) tablet 650 mg (has no administration in time range)    Or  acetaminophen (TYLENOL) suppository 650 mg (has no administration in time range)  ondansetron (ZOFRAN) tablet 4 mg (has no administration in time range)    Or  ondansetron (ZOFRAN) injection 4 mg (has no administration in time range)  oxyCODONE (Oxy IR/ROXICODONE) immediate release tablet 5 mg (has no administration in time range)  morphine 2 MG/ML injection 2 mg (has no administration in time range)  lactated ringers bolus 1,500 mL (has no administration in time range)  vancomycin (VANCOREADY) IVPB 750 mg/150 mL (has no administration in time range)  fentaNYL (SUBLIMAZE) injection 50 mcg (50 mcg Intravenous Given 07/26/21 0318)  ondansetron (ZOFRAN) injection 4 mg (4 mg Intravenous Given 07/26/21 0318)  ceFEPIme (MAXIPIME) 2 g in sodium chloride 0.9 % 100 mL IVPB (0 g Intravenous Stopped 07/26/21 0347)  lactated ringers bolus 1,000 mL (1,000 mLs Intravenous New Bag/Given 07/26/21 0315)     ED COURSE: Patient's labs show leukocytosis of 13,000 with left shift.  Creatinine is slowly increasing from baseline.  She has a lactic of 2.6.  COVID and flu negative.  Procalcitonin minimally elevated.  INR supratherapeutic at 3.6 but no signs of active bleeding.  Chest x-ray reviewed by myself and radiologist and shows no acute  abnormality.  Urine pending.  Will discuss with hospitalist for admission for sepsis from cellulitis.   CONSULTS:  Consulted and discussed patient's case with hospitalist, Dr. Damita Dunnings.  I have recommended admission and consulting physician agrees and will place admission orders.  Patient (and family if present) agree with this plan.   I reviewed all nursing notes, vitals, pertinent previous records.  All labs, EKGs, imaging ordered have been independently reviewed and interpreted by myself.    OUTSIDE RECORDS REVIEWED: I have reviewed patient's prior admission and in October 2019.  At that time she did have bacteremia secondary to staph infection and was treated with oral cefuroxime for 12 days.  High risk again for bacteremia.  Cultures today are pending.         FINAL CLINICAL IMPRESSION(S) / ED DIAGNOSES   Final diagnoses:  Cellulitis of multiple sites of buttock  Supratherapeutic INR  Multiple wounds of skin     Rx / DC Orders   ED Discharge Orders     None        Note:  This document was prepared using Dragon voice recognition software and may include unintentional dictation errors.   Martyna Thorns, Delice Bison, DO 07/26/21 [phone]

## 2021-07-26 NOTE — Progress Notes (Signed)
Notified provider of need to order lactic acid. ° °

## 2021-07-26 NOTE — Progress Notes (Incomplete)
No charge note.

## 2021-07-26 NOTE — Consult Note (Signed)
Pharmacy Antibiotic Note  Rhonda Saunders is a 88 y.o. female admitted on 07/26/2021 with cellulitis.  Pharmacy has been consulted for Vancomycin dosing.  Plan: Vancomycin 1000 mg x 1 ordered in ED Initiate Vancomycin 750 mg Q48H. Goal AUC 400-550 Estimated AUC 413/Cmin: 10 Scr 1.51, IBW, Vd 0.5    Weight: 83.9 kg (184 lb 15.5 oz)  Temp (24hrs), Avg:98.2 F (36.8 C), Min:98.2 F (36.8 C), Max:98.2 F (36.8 C)  Recent Labs  Lab 07/25/21 1425 07/25/21 1426 07/25/21 1605  WBC  --  13.8*  --   CREATININE  --  1.51*  --   LATICACIDVEN 2.6*  --  2.6*    Estimated Creatinine Clearance: 26.4 mL/min (A) (by C-G formula based on SCr of 1.51 mg/dL (H)).    Allergies  Allergen Reactions   Pentazocine Other (See Comments)    Other Reaction: Not Assessed     Antimicrobials this admission: 1/5 cefepime >>  1/5 Vancomycin >>   Dose adjustments this admission:   Microbiology results: 1/5 BCx: sent 1/5 UCx: sent    Thank you for allowing pharmacy to be a part of this patients care.  Dorothe Pea, PharmD, BCPS Clinical Pharmacist   07/26/2021 3:33 AM

## 2021-07-26 NOTE — Consult Note (Signed)
Pharmacy Antibiotic Note  Rhonda Saunders is a 88 y.o. female admitted on 07/26/2021 with cellulitis. Patient with decubitus ulcer on buttock. Pharmacy has been consulted for Vancomycin dosing.  Plan: Increase vancomycin to 1250 mg Q48H  Goal AUC 400-550 Estimated AUC 413 Scr 1.1, IBW, Vd 0.5    Weight: 83.9 kg (184 lb 15.5 oz)  Temp (24hrs), Avg:98.1 F (36.7 C), Min:97.8 F (36.6 C), Max:98.2 F (36.8 C)  Recent Labs  Lab 07/25/21 1425 07/25/21 1426 07/25/21 1605 07/26/21 0807  WBC  --  13.8*  --  14.6*  CREATININE  --  1.51*  --  1.10*  LATICACIDVEN 2.6*  --  2.6* 1.7     Estimated Creatinine Clearance: 36.2 mL/min (A) (by C-G formula based on SCr of 1.1 mg/dL (H)).    Allergies  Allergen Reactions   Pentazocine Other (See Comments)    Other Reaction: Not Assessed     Antimicrobials this admission: Cefepime 1/5 >>  Vancomycin 1/5 >>   Dose adjustments this admission: 1/5 Vanc 750 mg q48h >> 1250 mg q48h  Microbiology results: 1/5 BCx: NG pending 1/5 UCx: pending   Thank you for allowing pharmacy to be a part of this patients care.  Tawnya Crook, PharmD, BCPS Clinical Pharmacist   07/26/2021 11:52 AM

## 2021-07-26 NOTE — H&P (Addendum)
History and Physical    VIRNA LIVENGOOD GEX:[id number] DOB: June 07, 1932 DOA: 07/26/2021  PCP: Adin Hector, MD   Patient coming from: home  I have personally briefly reviewed patient's relevant medical records in [place]  Chief Complaint: altered mental status  HPI: Rhonda Saunders is a 88 y.o. female with medical history significant for HTN, CAD, diastolic CHF, aortic valve replacement on Coumadin, asthma, DM, dementia, chronic pain, ischial decubitus ulcers, chronic venous stasis ulcers on Unna boots, who was brought to the ED with a complaint of altered mental status, weakness, foul odor from wounds on buttock and concern for possible UTI.  Unable to get history from patient due to dementia.  Son and daughter at bedside stated that patient gets visits from wound care nurse.  Patient is dependent in all activities of daily living  ED course: On arrival BP 172/60 but otherwise normal vitals Blood work: WBC 13.8 with lactic acid 2.6-2.6 and procalcitonin 0.12 Creatinine 1.51 with baseline around 1.3 Hemoglobin 11.6 INR 3.6 COVID and flu negative  EKG, personally viewed and interpreted: Sinus at 87 with nonspecific ST-T wave changes  Imaging: Chest x-ray with no focal consolidation or large pleural effusion  Patient was treated with an IV fluid bolus and started on broad-spectrum antibiotics for management of sepsis secondary to cellulitis/infected wounds.  She also required fentanyl for pain relief.  Hospitalist consulted for admission   Review of Systems: Unable to obtain due to dementia  Assessment/Plan  Possible sepsis ([place])   Decubitus ulcer buttock, infected, unspecified pressure ulcer stage   Cellulitis buttock - Elevated WBC with lactic acid 2.6-2.6 without fever or tachycardia - Zosyn and Flagyl - Follow blood and wound cultures - Wound care consult - Consider surgical consult in the a.m. if debridement needed  Venous stasis with ulcers posterior right calf -  Wound care consult - Keep leg elevated when possible  Functional quadriplegia Obesity -Complicating factor to overall prognosis and care - PT and OT consult    Coronary artery disease - Appears stable, without chest pain and EKG nonacute - Continue home metoprolol    Chronic diastolic CHF (congestive heart failure) (HCC) - Appears euvolemic - Continue metoprolol, ramipril, furosemide and monitor renal function - Daily weights    Essential hypertension - Continue ramipril, metoprolol, furosemide    Hx of aortic valve replacement, mechanical   Supratherapeutic INR, anticoagulated on warfarin - Hold Coumadin for now - Pharmacy to manage    Senile dementia without behavioral disturbance ([place]) - Continue Aricept - Delirium precautions - Haldol as needed    Type 2 diabetes, diet controlled (HCC) - Hemoglobin A1c    Chronic pain syndrome/neuropathic pain - Continue gabapentin - Oxycodone as needed and resume OxyContin pending med verification      COPD (chronic obstructive pulmonary disease) (HCC) - Not acutely exacerbated - Ventolin as needed    Stage 3a chronic kidney disease ([place]) - Renal function at baseline   DVT prophylaxis: coumadin Code Status: full code  Family Communication: Son and daughter at bedside Disposition Plan: Back to previous home environment Consults called: none  Status:At the time of admission, it appears that the appropriate admission status for this patient is INPATIENT. This is judged to be reasonable and necessary in order to provide the required intensity of service to ensure the patient's safety given the presenting symptoms, physical exam findings, and initial radiographic and laboratory data in the context of their  Comorbid conditions.   Patient requires inpatient  status due to high intensity of service, high risk for further deterioration and high frequency of surveillance required.   I certify that at the point of admission it is my  clinical judgment that the patient will require inpatient hospital care spanning beyond 2 midnights     Physical Exam: Vitals:   07/25/21 1829 07/25/21 2250 07/26/21 0152 07/26/21 0301  BP: 137/66 (!) 150/94 (!) 157/134   Pulse: 77 93 89   Resp: 18 20 20    Temp:      TempSrc:      SpO2: 98% 96% 97%   Weight:    83.9 kg   Constitutional: Elderly female, lethargic. Not in any apparent distress HEENT:      Head: Normocephalic and atraumatic.         Eyes: PERLA, EOMI, Conjunctivae are normal. Sclera is non-icteric.       Mouth/Throat: Mucous membranes are moist.       Neck: Supple with no signs of meningismus. Cardiovascular: Regular rate and rhythm. No murmurs, gallops, or rubs. 2+ symmetrical distal pulses are present . No JVD.  2+ bilateral LE edema Respiratory: Respiratory effort normal .Lungs sounds clear bilaterally. No wheezes, crackles, or rhonchi.  Gastrointestinal: Soft, non tender, non distended. Positive bowel sounds.  Genitourinary: No CVA tenderness. Musculoskeletal: Bilateral lower extremity swelling Neurologic:  Face is symmetric. Moving all extremities. No gross focal neurologic deficits . Skin: ulcerstage I-II sacral decubitus ulcers with surrounding redness and warmth .Malodorous wound posterior right calf Psychiatric: Mood and affect are appropriate     Past Medical History:  Diagnosis Date   CHF (congestive heart failure) (HCC)    COPD (chronic obstructive pulmonary disease) (HCC)    Coronary artery disease    Dementia (HCC)    Diabetes mellitus without complication ([place])    Hyperlipidemia    Hypertension     Past Surgical History:  Procedure Laterality Date   AORTIC VALVE REPLACEMENT (AVR)/CORONARY ARTERY BYPASS GRAFTING (CABG)     TONSILLECTOMY       reports that she has never smoked. She has never used smokeless tobacco. She reports that she does not drink alcohol and does not use drugs.  Allergies  Allergen Reactions   Pentazocine Other (See  Comments)    Other Reaction: Not Assessed     Family History  Problem Relation Age of Onset   Hypertension Mother    Heart disease Mother    Hypertension Father    Diabetes Father      Prior to Admission medications   Medication Sig Start Date End Date Taking? Authorizing Provider  albuterol (PROVENTIL HFA;VENTOLIN HFA) 108 (90 Base) MCG/ACT inhaler Inhale 2 puffs into the lungs every 6 (six) hours as needed. 01/02/18 07/11/21  [provider]  atorvastatin (LIPITOR) 80 MG tablet Take 1 tablet by mouth daily. 09/11/16   [provider]  donepezil (ARICEPT) 5 MG tablet Take 5 mg by mouth at bedtime. 03/31/18   [provider]  Fluticasone-Salmeterol (ADVAIR) 250-50 MCG/DOSE AEPB INHALE 1 PUFF INTO THE LUNGS ONCE DAILY AS DIRECTED 11/13/15   [provider]  furosemide (LASIX) 40 MG tablet Take 80 mg by mouth 2 (two) times daily.    [provider]  gabapentin (NEURONTIN) 600 MG tablet Take 0.5 tablets (300 mg total) by mouth 2 (two) times daily. 05/19/18   Vaughan Basta, MD  gabapentin (NEURONTIN) 600 MG tablet Take 600 mg by mouth 2 (two) times daily. 09/19/20   [provider]  levothyroxine (SYNTHROID)  25 MCG tablet Take by mouth. 04/21/20 07/11/21  [provider]  metoprolol succinate (TOPROL-XL) 25 MG 24 hr tablet Take 1 tablet (25 mg total) by mouth daily. 05/20/18   Vaughan Basta, MD  nitroGLYCERIN (NITROSTAT) 0.4 MG SL tablet Place 0.4 mg under the tongue every 5 (five) minutes as needed for chest pain.    [provider]  nystatin (MYCOSTATIN) 100000 UNIT/ML suspension Take 5 mLs by mouth once. Patient not taking: Reported on 07/11/2021    [provider]  nystatin (MYCOSTATIN/NYSTOP) powder APPLY TOPICALLY 3 TIMES A DAY 05/03/19   [provider]  oxyCODONE (OXYCONTIN) 10 mg 12 hr tablet Take 1 tablet (10 mg total) by mouth every 12 (twelve) hours. 05/19/18   Vaughan Basta, MD  polyethylene glycol (MIRALAX / GLYCOLAX) packet Take 17 g by mouth daily as needed for mild constipation. 05/19/18   Vaughan Basta, MD  potassium chloride SA (K-DUR,KLOR-CON) 20 MEQ tablet TAKE 1 TABLET BY MOUTH TWICE A DAY. 03/11/16   [provider]  ramipril (ALTACE) 10 MG capsule Take 10 mg by mouth daily.    [provider]  tiZANidine (ZANAFLEX) 2 MG tablet Take 2 mg by mouth at bedtime. 01/20/18   [provider]  triamcinolone cream (KENALOG) 0.1 % Apply topically. Patient not taking: Reported on 07/11/2021 12/07/20   [provider]  warfarin (COUMADIN) 5 MG tablet Take 0.5-1 tablets (2.5-5 mg total) by mouth one time only at 6 PM. Take 2.5 mg Monday, wed, Friday and 5 mg the remaining days, and need to adjust the dose per INR. Patient taking differently: Take 5 mg by mouth one time only at 6 PM. 05/21/18   Vaughan Basta, MD      Labs on Admission: I have personally reviewed following labs and imaging studies  CBC: Recent Labs  Lab 07/25/21 1426  WBC 13.8*  NEUTROABS 10.7*  HGB 11.6*  HCT 37.4  MCV 94.7  PLT 259   Basic Metabolic Panel: Recent Labs  Lab 07/25/21 1426  NA 141  K 3.8  CL 108  CO2 25  GLUCOSE 146*  BUN 37*  CREATININE 1.51*  CALCIUM 8.2*   GFR: Estimated Creatinine Clearance: 26.4 mL/min (A) (by C-G formula based on SCr of 1.51 mg/dL (H)). Liver Function Tests: Recent Labs  Lab 07/25/21 1426  AST 50*  ALT 23  ALKPHOS 71  BILITOT 0.9  PROT 8.5*  ALBUMIN 2.6*   No results for input(s): LIPASE, AMYLASE in the last 168 hours. No results for input(s): AMMONIA in the last 168 hours. Coagulation Profile: Recent Labs  Lab 07/25/21 1426  INR 3.6*   Cardiac Enzymes: No results for input(s): CKTOTAL, CKMB, CKMBINDEX, TROPONINI in the last 168 hours. BNP (last 3 results) No results for input(s): PROBNP in the last 8760 hours. HbA1C: No results for input(s): HGBA1C in the  last 72 hours. CBG: No results for input(s): GLUCAP in the last 168 hours. Lipid Profile: No results for input(s): CHOL, HDL, LDLCALC, TRIG, CHOLHDL, LDLDIRECT in the last 72 hours. Thyroid Function Tests: No results for input(s): TSH, T4TOTAL, FREET4, T3FREE, THYROIDAB in the last 72 hours. Anemia Panel: No results for input(s): VITAMINB12, FOLATE, FERRITIN, TIBC, IRON, RETICCTPCT in the last 72 hours. Urine analysis:    Component Value Date/Time   COLORURINE YELLOW (A) 05/18/2018 1911   APPEARANCEUR CLEAR (A) 05/18/2018 1911   LABSPEC 1.017 05/18/2018 1911   PHURINE 6.0 05/18/2018 1911   GLUCOSEU NEGATIVE 05/18/2018 1911   HGBUR  SMALL (A) 05/18/2018 1911   BILIRUBINUR NEGATIVE 05/18/2018 1911   KETONESUR NEGATIVE 05/18/2018 1911   PROTEINUR 30 (A) 05/18/2018 1911   NITRITE NEGATIVE 05/18/2018 1911   LEUKOCYTESUR NEGATIVE 05/18/2018 1911    Radiological Exams on Admission: DG Chest 1 View  Result Date: 07/25/2021 CLINICAL DATA:  Sepsis. Weakness and wound infection. Altered mental status. EXAM: CHEST  1 VIEW COMPARISON:  Chest radiograph dated May 18, 2018 FINDINGS: Heart is normal in size. Evidence of prior coronary artery bypass grafting. Low lung volumes without focal consolidation or pleural effusion. Advanced bilateral glenohumeral osteoarthritis. Osteopenia. IMPRESSION: No focal consolidation or large pleural effusion. Electronically Signed   By: Keane Police D.O.   On: 07/25/2021 14:42       Athena Masse MD [facility]   07/26/2021, 3:08 AM

## 2021-07-26 NOTE — Consult Note (Signed)
CODE SEPSIS - PHARMACY COMMUNICATION  **Broad Spectrum Antibiotics should be administered within 1 hour of Sepsis diagnosis**  Time Code Sepsis Called/Page Received: 0211  Antibiotics Ordered: 0211  Time of 1st antibiotic administration: [place] ,PharmD, BCPS Clinical Pharmacist  07/26/2021  2:23 AM

## 2021-07-27 DIAGNOSIS — G9341 Metabolic encephalopathy: Secondary | ICD-10-CM

## 2021-07-27 LAB — BASIC METABOLIC PANEL
Anion gap: 3 — ABNORMAL LOW (ref 5–15)
BUN: 29 mg/dL — ABNORMAL HIGH (ref 8–23)
CO2: 27 mmol/L (ref 22–32)
Calcium: 7.9 mg/dL — ABNORMAL LOW (ref 8.9–10.3)
Chloride: 110 mmol/L (ref 98–111)
Creatinine, Ser: 1.06 mg/dL — ABNORMAL HIGH (ref 0.44–1.00)
GFR, Estimated: 51 mL/min — ABNORMAL LOW (ref >60)
Glucose, Bld: 92 mg/dL (ref 70–99)
Potassium: 3.6 mmol/L (ref 3.5–5.1)
Sodium: 140 mmol/L (ref 135–145)

## 2021-07-27 LAB — CBC
HCT: 30.4 % — ABNORMAL LOW (ref 36.0–46.0)
Hemoglobin: 9.6 g/dL — ABNORMAL LOW (ref 12.0–15.0)
MCH: 29.3 pg (ref 26.0–34.0)
MCHC: 31.6 g/dL (ref 30.0–36.0)
MCV: 92.7 fL (ref 80.0–100.0)
Platelets: 178 10*3/uL (ref 150–400)
RBC: 3.28 MIL/uL — ABNORMAL LOW (ref 3.87–5.11)
RDW: 19.1 % — ABNORMAL HIGH (ref 11.5–15.5)
WBC: 12.8 10*3/uL — ABNORMAL HIGH (ref 4.0–10.5)
nRBC: 0.2 % (ref 0.0–0.2)

## 2021-07-27 LAB — GLUCOSE, CAPILLARY
Glucose-Capillary: 87 mg/dL (ref 70–99)
Glucose-Capillary: 90 mg/dL (ref 70–99)
Glucose-Capillary: 111 mg/dL — ABNORMAL HIGH (ref 70–99)
Glucose-Capillary: 106 mg/dL — ABNORMAL HIGH (ref 70–99)

## 2021-07-27 LAB — MAGNESIUM: Magnesium: 2.1 mg/dL (ref 1.7–2.4)

## 2021-07-27 LAB — PROTIME-INR
INR: 5.9 (ref 0.8–1.2)
Prothrombin Time: 52.9 seconds — ABNORMAL HIGH (ref 11.4–15.2)

## 2021-07-27 LAB — URINE CULTURE

## 2021-07-27 MED ORDER — FLUTICASONE FUROATE-VILANTEROL 200-25 MCG/ACT IN AEPB
1.0000 | INHALATION_SPRAY | Freq: Every day | RESPIRATORY_TRACT | Status: DC
  Administered 2021-07-27 – 2021-08-16 (×20): 1 via RESPIRATORY_TRACT
  Filled 2021-07-27 (×2): qty 28

## 2021-07-27 MED ORDER — ATORVASTATIN CALCIUM 20 MG PO TABS
80.0000 mg | ORAL_TABLET | Freq: Every day | ORAL | Status: DC
  Administered 2021-07-27 – 2021-08-16 (×21): 80 mg via ORAL
  Filled 2021-07-27 (×20): qty 4

## 2021-07-27 MED ORDER — POLYETHYLENE GLYCOL 3350 17 G PO PACK
17.0000 g | PACK | Freq: Every day | ORAL | Status: DC
  Administered 2021-07-28 – 2021-08-16 (×18): 17 g via ORAL
  Filled 2021-07-27 (×19): qty 1

## 2021-07-27 MED ORDER — METOPROLOL SUCCINATE ER 25 MG PO TB24
12.5000 mg | ORAL_TABLET | Freq: Every day | ORAL | Status: DC
  Administered 2021-07-27 – 2021-08-16 (×21): 12.5 mg via ORAL
  Filled 2021-07-27 (×21): qty 1

## 2021-07-27 MED ORDER — LEVOTHYROXINE SODIUM 25 MCG PO TABS
25.0000 ug | ORAL_TABLET | Freq: Every day | ORAL | Status: DC
  Administered 2021-07-28 – 2021-08-16 (×20): 25 ug via ORAL
  Filled 2021-07-27 (×20): qty 1

## 2021-07-27 MED ORDER — GABAPENTIN 600 MG PO TABS
600.0000 mg | ORAL_TABLET | Freq: Two times a day (BID) | ORAL | Status: DC
  Administered 2021-07-27 – 2021-07-31 (×9): 600 mg via ORAL
  Filled 2021-07-27 (×9): qty 1

## 2021-07-27 MED ORDER — DONEPEZIL HCL 5 MG PO TABS
5.0000 mg | ORAL_TABLET | Freq: Every day | ORAL | Status: DC
  Administered 2021-07-28 – 2021-08-15 (×19): 5 mg via ORAL
  Filled 2021-07-27 (×19): qty 1

## 2021-07-27 MED ORDER — OXYCODONE HCL ER 10 MG PO T12A
10.0000 mg | EXTENDED_RELEASE_TABLET | Freq: Two times a day (BID) | ORAL | Status: DC | PRN
  Administered 2021-07-27 – 2021-08-05 (×10): 10 mg via ORAL
  Filled 2021-07-27 (×10): qty 1

## 2021-07-27 MED ORDER — HALOPERIDOL LACTATE 5 MG/ML IJ SOLN
1.0000 mg | Freq: Once | INTRAMUSCULAR | Status: AC
  Administered 2021-07-27: 01:00:00 1 mg via INTRAVENOUS
  Filled 2021-07-27: qty 1

## 2021-07-27 MED ORDER — LEVOTHYROXINE SODIUM 25 MCG PO TABS
25.0000 ug | ORAL_TABLET | Freq: Every day | ORAL | Status: DC
  Administered 2021-07-27: 25 ug via ORAL
  Filled 2021-07-27: qty 1

## 2021-07-27 MED ORDER — ALBUTEROL SULFATE (2.5 MG/3ML) 0.083% IN NEBU: 3.0000 mL | INHALATION_SOLUTION | Freq: Four times a day (QID) | RESPIRATORY_TRACT | Status: DC | PRN

## 2021-07-27 MED ORDER — COLLAGENASE 250 UNIT/GM EX OINT
TOPICAL_OINTMENT | Freq: Every day | CUTANEOUS | Status: DC
  Filled 2021-07-27: qty 30

## 2021-07-27 MED ORDER — HYDROCODONE-ACETAMINOPHEN 5-325 MG PO TABS
1.0000 | ORAL_TABLET | Freq: Four times a day (QID) | ORAL | Status: DC | PRN
  Administered 2021-07-27: 1 via ORAL
  Filled 2021-07-27: qty 1

## 2021-07-27 NOTE — Progress Notes (Addendum)
PROGRESS NOTE    Rhonda Saunders  JKK:[id number] DOB: February 14, 1932 DOA: 07/26/2021 PCP: Adin Hector, MD  125A/125A-AA   Assessment & Plan:   Principal Problem:   Sepsis [facility]) Active Problems:   Coronary artery disease   Chronic diastolic CHF (congestive heart failure) ([place])   Essential hypertension   Hx of aortic valve replacement, mechanical   Anticoagulated on warfarin   Senile dementia without behavioral disturbance ([place])   Type 2 diabetes, diet controlled (HCC)   Chronic pain syndrome   Lower limb ulcer, ankle, right, with fat layer exposed ([place])   Decubitus ulcer, infected, unspecified pressure ulcer stage   Cellulitis   COPD (chronic obstructive pulmonary disease) ([place])   Stage 3a chronic kidney disease ([place])   Supratherapeutic INR   Acute metabolic encephalopathy   Functional quadriplegia (HCC)   Rhonda Saunders is a 88 y.o. female with medical history significant for HTN, CAD, diastolic CHF, aortic valve replacement on Coumadin, asthma, DM, dementia, chronic pain, ischial decubitus ulcers, chronic venous stasis ulcers on Unna boots, who was brought to the ED with a complaint of altered mental status, weakness, foul odor from wounds on buttock and concern for possible UTI.   Sepsis, ruled out --does not meet criteria    Decubitus ulcer buttock, infected, unspecified pressure ulcer stage   Cellulitis buttock - Elevated WBC with lactic acid 2.6-2.6 without fever or tachycardia - received vanc, cefepime on admission Plan: --cont vanc and ceftriaxone --wound care consult and dressing orders --GenSurg consult  AMS and lethargy --possibly due to too much scheduled opioids and gabapentin --resume home opioids as PRN  Venous stasis with ulcers posterior right calf --wound care consult and dressing orders   Functional quadriplegia Obesity - PT and OT consult     Coronary artery disease - Appears stable, without chest pain and EKG nonacute --cont home statin      Chronic diastolic CHF (congestive heart failure) (HCC) - Appears euvolemic     Essential hypertension - cont home Toprol     Hx of aortic valve replacement, mechanical   Supratherapeutic INR, anticoagulated on warfarin --INR 3.6 on presentation, went up to 5.9 this morning --hold coumadin - Pharmacy to manage     Senile dementia without behavioral disturbance ([place]) - Continue Aricept - Delirium precautions - Haldol as needed     Type 2 diabetes, diet controlled (HCC) - Hemoglobin A1c 5.8 --d/c BG checks and SSI     Chronic pain syndrome/neuropathic pain - resume home gabapentin - resume home oxycontin 10 mg BID PRN       COPD (chronic obstructive pulmonary disease) (HCC) - Not acutely exacerbated - Ventolin as needed     Stage 3a chronic kidney disease (HCC)   DVT prophylaxis: [id number] Code Status: Full code son refused palliative consult currently Family Communication: sons updated at bedside today  Level of care: Med-Surg Dispo:   The patient is from: home Anticipated d/c is to: SNF Anticipated d/c date is: undetermined Patient currently is not medically ready to d/c due to: possible surgical I/D   Subjective and Interval History:  Pt became more alert today, and complained of pain and asking for her home pain meds.  Son reported pt would eat when fed.  Son refused palliative consult currently.   Objective: Vitals:   07/27/21 0844 07/27/21 1156 07/27/21 1719 07/27/21 2057  BP: (!) 156/53 (!) 155/79 (!) 134/55 (!) 145/57  Pulse: 80 81 81 87  Resp: 16 16 18  18  Temp: 98.2 F (36.8 C) 98.4 F (36.9 C) 98 F (36.7 C) 99.6 F (37.6 C)  TempSrc: Oral Oral Oral   SpO2: 100% 99% 95% 97%  Weight:        Intake/Output Summary (Last 24 hours) at 07/28/2021 0000 Last data filed at 07/27/2021 1300 Gross per 24 hour  Intake 0 ml  Output 600 ml  Net -600 ml   Filed Weights   07/26/21 0301  Weight: 83.9 kg    Examination:   Constitutional: NAD,  alert HEENT: conjunctivae and lids normal, EOMI CV: No cyanosis.   RESP: normal respiratory effort, on RA Neuro: II - XII grossly intact.     Data Reviewed: I have personally reviewed following labs and imaging studies  CBC: Recent Labs  Lab 07/25/21 1426 07/26/21 0807 07/27/21 0425  WBC 13.8* 14.6* 12.8*  NEUTROABS 10.7*  --   --   HGB 11.6* 10.2* 9.6*  HCT 37.4 33.4* 30.4*  MCV 94.7 93.8 92.7  PLT 195 180 601   Basic Metabolic Panel: Recent Labs  Lab 07/25/21 1426 07/26/21 0807 07/27/21 0425  NA 141 140 140  K 3.8 3.9 3.6  CL 108 106 110  CO2 25 27 27   GLUCOSE 146* 115* 92  BUN 37* 33* 29*  CREATININE 1.51* 1.10* 1.06*  CALCIUM 8.2* 8.1* 7.9*  MG  --   --  2.1   GFR: Estimated Creatinine Clearance: 37.5 mL/min (A) (by C-G formula based on SCr of 1.06 mg/dL (H)). Liver Function Tests: Recent Labs  Lab 07/25/21 1426  AST 50*  ALT 23  ALKPHOS 71  BILITOT 0.9  PROT 8.5*  ALBUMIN 2.6*   No results for input(s): LIPASE, AMYLASE in the last 168 hours. No results for input(s): AMMONIA in the last 168 hours. Coagulation Profile: Recent Labs  Lab 07/25/21 1426 07/26/21 0807 07/27/21 0425  INR 3.6* 5.2* 5.9*   Cardiac Enzymes: No results for input(s): CKTOTAL, CKMB, CKMBINDEX, TROPONINI in the last 168 hours. BNP (last 3 results) No results for input(s): PROBNP in the last 8760 hours. HbA1C: Recent Labs    07/26/21 0807  HGBA1C 5.8*   CBG: Recent Labs  Lab 07/26/21 2107 07/27/21 0841 07/27/21 1215 07/27/21 1716 07/27/21 2059  GLUCAP 104* 87 90 111* 106*   Lipid Profile: No results for input(s): CHOL, HDL, LDLCALC, TRIG, CHOLHDL, LDLDIRECT in the last 72 hours. Thyroid Function Tests: No results for input(s): TSH, T4TOTAL, FREET4, T3FREE, THYROIDAB in the last 72 hours. Anemia Panel: No results for input(s): VITAMINB12, FOLATE, FERRITIN, TIBC, IRON, RETICCTPCT in the last 72 hours. Sepsis Labs: Recent Labs  Lab 07/25/21 1425  07/25/21 1605 07/26/21 0807  PROCALCITON 0.12  --   --   LATICACIDVEN 2.6* 2.6* 1.7    Recent Results (from the past 240 hour(s))  Culture, blood (Routine x 2)     Status: None (Preliminary result)   Collection Time: 07/25/21  2:25 PM   Specimen: BLOOD  Result Value Ref Range Status   Specimen Description BLOOD RIGHT ANTECUBITAL  Final   Special Requests   Final    BOTTLES DRAWN AEROBIC AND ANAEROBIC Blood Culture adequate volume   Culture   Final    NO GROWTH 2 DAYS Performed at [facility], [address] [place], [place] [address]    Report Status PENDING  Incomplete  Culture, blood (Routine x 2)     Status: None (Preliminary result)   Collection Time: 07/25/21  4:22 PM   Specimen: BLOOD  Result Value Ref Range Status   Specimen Description BLOOD BLOOD RIGHT WRIST  Final   Special Requests   Final    BOTTLES DRAWN AEROBIC AND ANAEROBIC Blood Culture results may not be optimal due to an inadequate volume of blood received in culture bottles   Culture   Final    NO GROWTH 2 DAYS Performed at [facility], [address], [place], [place] [address]    Report Status PENDING  Incomplete  Resp Panel by RT-PCR (Flu A&B, Covid) Nasopharyngeal Swab     Status: None   Collection Time: 07/26/21  1:44 AM   Specimen: Nasopharyngeal Swab; Nasopharyngeal(NP) swabs in vial transport medium  Result Value Ref Range Status   SARS Coronavirus 2 by RT PCR NEGATIVE NEGATIVE Final    Comment: (NOTE) SARS-CoV-2 target nucleic acids are NOT DETECTED.  The SARS-CoV-2 RNA is generally detectable in upper respiratory specimens during the acute phase of infection. The lowest concentration of SARS-CoV-2 viral copies this assay can detect is 138 copies/mL. A negative result does not preclude SARS-Cov-2 infection and should not be used as the sole basis for treatment or other patient management decisions. A negative result may occur with  improper specimen collection/handling,  submission of specimen other than nasopharyngeal swab, presence of viral mutation(s) within the areas targeted by this assay, and inadequate number of viral copies(<138 copies/mL). A negative result must be combined with clinical observations, patient history, and epidemiological information. The expected result is Negative.  Fact Sheet for Patients:  EntrepreneurPulse.com.au  Fact Sheet for Healthcare Providers:  IncredibleEmployment.be  This test is no t yet approved or cleared by the [place] FDA and  has been authorized for detection and/or diagnosis of SARS-CoV-2 by FDA under an Emergency Use Authorization (EUA). This EUA will remain  in effect (meaning this test can be used) for the duration of the COVID-19 declaration under Section 564(b)(1) of the Act, 21 U.S.C.section 360bbb-3(b)(1), unless the authorization is terminated  or revoked sooner.       Influenza A by PCR NEGATIVE NEGATIVE Final   Influenza B by PCR NEGATIVE NEGATIVE Final    Comment: (NOTE) The Xpert Xpress SARS-CoV-2/FLU/RSV plus assay is intended as an aid in the diagnosis of influenza from Nasopharyngeal swab specimens and should not be used as a sole basis for treatment. Nasal washings and aspirates are unacceptable for Xpert Xpress SARS-CoV-2/FLU/RSV testing.  Fact Sheet for Patients: EntrepreneurPulse.com.au  Fact Sheet for Healthcare Providers: IncredibleEmployment.be  This test is not yet approved or cleared by the [place] FDA and has been authorized for detection and/or diagnosis of SARS-CoV-2 by FDA under an Emergency Use Authorization (EUA). This EUA will remain in effect (meaning this test can be used) for the duration of the COVID-19 declaration under Section 564(b)(1) of the Act, 21 U.S.C. section 360bbb-3(b)(1), unless the authorization is terminated or revoked.  Performed at [facility], [address] [place], [place] [address]   Urine Culture     Status: Abnormal   Collection Time: 07/26/21  5:12 AM   Specimen: Urine, Clean Catch  Result Value Ref Range Status   Specimen Description   Final    URINE, CLEAN CATCH Performed at [facility], [address], [place], [place] [address]    Special Requests   Final    NONE Performed at [facility], [address] [place], [place] [address]    Culture MULTIPLE SPECIES PRESENT, SUGGEST RECOLLECTION (A)  Final   Report  Status 07/27/2021 FINAL  Final      Radiology Studies: No results found.   Scheduled Meds:  atorvastatin  80 mg Oral Daily   collagenase   Topical Daily   donepezil  5 mg Oral QHS   fluticasone furoate-vilanterol  1 puff Inhalation Daily   gabapentin  600 mg Oral BID   insulin aspart  0-15 Units Subcutaneous q AM   levothyroxine  25 mcg Oral Q0600   metoprolol succinate  12.5 mg Oral Daily   polyethylene glycol  17 g Oral Daily   Warfarin - Pharmacist Dosing Inpatient   Does not apply q1600   Continuous Infusions:  cefTRIAXone (ROCEPHIN)  IV 1 g (07/27/21 1934)   vancomycin       LOS: 2 days     Enzo Bi, MD [facility] If 7PM-7AM, please contact night-coverage 07/28/2021, 12:00 AM

## 2021-07-27 NOTE — Consult Note (Addendum)
[place] Nurse Consult Note: Patient receiving care in [facility] 125. Assisted with turning/positioning by 2 NTs. Patient was incontinent of stool at time of my visit. Patient provided hygiene and new linens. Reason for Consult: "wound on back and thigh" Wound type: the entire buttocks is an evolving DTPI. There are open, scattered areas that are pink and at least one that is necrotic.  The most impaired area measures 8 cm x 10 cm and a smaller coccyx wound that measures 3 cm x 2 cm. The remainder of the the bilateral buttocks is darkened and some areas are maroon in color.  This entire area may progress to an unstageable PI at some point in the future.  The right heel has a foul smelling, necrotic wound that measures 4 cm x 4 cm. It is primarily black eschar, but there is also a narrow area of yellow slough.   Along the posterior RLE in the calf region there is a necrotic wound that measures 11 cm x 8 cm.  It also has a foul odor.  It may be worthwhile consulting surgery to evaluate the patient for amputation.  Also, to have palliative care consult with the patient and family for goals of care. Pressure Injury POA: Yes Measurement: as above Wound bed: Drainage (amount, consistency, odor) YES, foul odor Periwound: Dressing procedure/placement/frequency: Apply Santyl to the right heel, RLE wound, and coccyx/sacral area in a nickel thick layer. Cover with a saline moistened gauze, then dry gauze or ABD pad, secure with kerlex on the right foot and leg. Top with a sacral foam dressing on the buttocks wounds.  Change daily. I provided care to the skin tear on the left dorsal hand and placed an order for the following: Place a vaseline gauze over the left dorsal hand skin tear, cover with a foam dressing. Change every 3 days. MOISTEN WITH SALINE IF NEEDED TO REMOVE WITHOUT TEARING THE SKIN FURTHER. NEXT DUE 07/30/21.  I have also ordered a standard size bed with air mattress.  The patient's heels are being floated  on pillows.  I have added turning and floating instructions.  [place] nurse will not follow at this time.  Please re-consult the [place] team if needed.  Val Riles, RN, MSN, CWOCN, CNS-BC, pager [phone]

## 2021-07-27 NOTE — TOC CM/SW Note (Signed)
Patient was active with [facility] prior to admission but they are unable to accept her back at discharge due to being unable to meet her needs. According to notes from patient's PCP office, family was trying to have her placed from home due to increased weakness: "Spoke with daughter in law/ [place] who stated that patient is no longer feeding herself nor drinking. Patient does not have enough energy to to help stand or even lift her arms. Patient is no longer able to be transported by car. Roland Rack feels as though patient may be dehydrated along with a failure to thrive." Asked MD to enter PT/OT orders when appropriate.   Dayton Scrape, [place]

## 2021-07-27 NOTE — Consult Note (Signed)
Pharmacy Antibiotic Note  Rhonda Saunders is a 88 y.o. female admitted on 07/26/2021 with cellulitis. Patient with decubitus ulcer on buttock. Pharmacy has been consulted for vancomycin dosing. Renal function has improved noticeably since admission.  Plan: continue vancomycin at 1250 mg Q48H  Goal AUC 400-550 Estimated AUC 514.7 SCr 1.06 mg/dL  Ke: 0.029 h-1, T1/2: 23.9h Daily SCr while on IV vancomycin  Weight: 83.9 kg (184 lb 15.5 oz)  Temp (24hrs), Avg:98.3 F (36.8 C), Min:98.1 F (36.7 C), Max:98.6 F (37 C)  Recent Labs  Lab 07/25/21 1425 07/25/21 1426 07/25/21 1605 07/26/21 0807 07/27/21 0425  WBC  --  13.8*  --  14.6* 12.8*  CREATININE  --  1.51*  --  1.10* 1.06*  LATICACIDVEN 2.6*  --  2.6* 1.7  --      Estimated Creatinine Clearance: 37.5 mL/min (A) (by C-G formula based on SCr of 1.06 mg/dL (H)).    Allergies  Allergen Reactions   Pentazocine Other (See Comments)    Other Reaction: Not Assessed     Antimicrobials this admission: ceftriaxone 1/5 >>  vancomycin 1/5 >>   Dose adjustments this admission: 1/5 vancomycin 750 mg q48h >> 1250 mg q48h  Microbiology results: 1/5 BCx: NG x 2 days 1/5 UCx: contaminated 1/5 SARS CoV-2: negative 1/5 influenza A/B: negative   Thank you for allowing pharmacy to be a part of this patients care.  Dallie Piles, PharmD, BCPS Clinical Pharmacist   07/27/2021 10:50 AM

## 2021-07-27 NOTE — Consult Note (Signed)
Subjective:   CC: pressure ulcers  HPI:  Rhonda Saunders is a 88 y.o. female who was consulted by Billie Ruddy for evaluation of above.  First noted during this admission.  Multiple pressure ulcers.  Pt is not mobile at home and sits in chair all day per son's report.  She complains of pain on her bottom and right lower leg. No specific instigating, alleviating factors.  Admitted for concern of infection at wound sites.   Past Medical History:  has a past medical history of CHF (congestive heart failure) ([place]), COPD (chronic obstructive pulmonary disease) ([place]), Coronary artery disease, Dementia ([place]), Diabetes mellitus without complication ([place]), Hyperlipidemia, and Hypertension.  Past Surgical History:  has a past surgical history that includes Tonsillectomy and Aortic valve replacement (avr)/coronary artery bypass grafting (cabg).  Family History: family history includes Diabetes in her father; Heart disease in her mother; Hypertension in her father and mother.  Social History:  reports that she has never smoked. She has never used smokeless tobacco. She reports that she does not drink alcohol and does not use drugs.  Current Medications:  Prior to Admission medications   Medication Sig Start Date End Date Taking? Authorizing Provider  atorvastatin (LIPITOR) 80 MG tablet Take 1 tablet by mouth daily. 09/11/16  Yes [provider]  donepezil (ARICEPT) 5 MG tablet Take 5 mg by mouth at bedtime. 03/31/18  Yes [provider]  Fluticasone-Salmeterol (ADVAIR) 250-50 MCG/DOSE AEPB INHALE 1 PUFF INTO THE LUNGS ONCE DAILY AS DIRECTED 11/13/15  Yes [provider]  furosemide (LASIX) 40 MG tablet Take 80 mg by mouth 2 (two) times daily.   Yes [provider]  gabapentin (NEURONTIN) 600 MG tablet Take 600 mg by mouth 2 (two) times daily. 09/19/20  Yes [provider]  levothyroxine (SYNTHROID) 25 MCG tablet Take 1 tablet by mouth daily. 06/21/21 06/21/22 Yes [provider]  metoprolol succinate (TOPROL-XL) 25 MG 24 hr tablet Take 1 tablet (25 mg total) by mouth daily. Patient taking differently: Take 12.5 mg by mouth daily. 05/20/18  Yes Vaughan Basta, MD  oxyCODONE (OXYCONTIN) 10 mg 12 hr tablet Take 1 tablet (10 mg total) by mouth every 12 (twelve) hours. 05/19/18  Yes Vaughan Basta, MD  polyethylene glycol (MIRALAX / GLYCOLAX) packet Take 17 g by mouth daily as needed for mild constipation. 05/19/18  Yes Vaughan Basta, MD  potassium chloride SA (K-DUR,KLOR-CON) 20 MEQ tablet TAKE 1 TABLET BY MOUTH TWICE A DAY. 03/11/16  Yes [provider]  ramipril (ALTACE) 10 MG capsule Take 10 mg by mouth daily.   Yes [provider]  tiZANidine (ZANAFLEX) 2 MG tablet Take 2 mg by mouth at bedtime. 01/20/18  Yes [provider]  warfarin (COUMADIN) 5 MG tablet Take 0.5-1 tablets (2.5-5 mg total) by mouth one time only at 6 PM. Take 2.5 mg Monday, wed, Friday and 5 mg the remaining days, and need to adjust the dose per INR. Patient taking differently: Take 5 mg by mouth one time only at 6 PM. 05/21/18  Yes Vaughan Basta, MD  albuterol (PROVENTIL HFA;VENTOLIN HFA) 108 (90 Base) MCG/ACT inhaler Inhale 2 puffs into the lungs every 6 (six) hours as needed. 01/02/18 07/11/21  [provider]  nitroGLYCERIN (NITROSTAT) 0.4 MG SL tablet Place 0.4 mg under the tongue every 5 (five) minutes as needed for chest pain.    [provider]    Allergies:  Allergies  Allergen Reactions   Pentazocine Other (See Comments)  Other Reaction: Not Assessed     ROS:  General: Denies weight loss, weight gain, fatigue, fevers, chills, and night sweats. Eyes: Denies blurry vision, double vision, eye pain, itchy eyes, and tearing. Ears: Denies hearing loss, earache, and ringing in ears. Nose: Denies sinus pain, congestion, infections, runny nose, and nosebleeds. Mouth/throat: Denies hoarseness, sore  throat, bleeding gums, and difficulty swallowing. Heart: Denies chest pain, palpitations, racing heart, irregular heartbeat, leg pain or swelling, and decreased activity tolerance. Respiratory: Denies breathing difficulty, shortness of breath, wheezing, cough, and sputum. GI: Denies change in appetite, heartburn, nausea, vomiting, constipation, diarrhea, and blood in stool. GU: Denies difficulty urinating, pain with urinating, urgency, frequency, blood in urine. Musculoskeletal: Denies joint stiffness, pain, swelling, muscle weakness. Skin: Denies rash, itching, mass, tumors, sores, and boils Neurologic: Denies headache, fainting, dizziness, seizures, numbness, and tingling. Psychiatric: Denies depression, anxiety, difficulty sleeping, and memory loss. Endocrine: Denies heat or cold intolerance, and increased thirst or urination. Blood/lymph: Denies easy bruising, easy bruising, and swollen glands     Objective:     BP (!) 155/79 (BP Location: Right Arm)    Pulse 81    Temp 98.4 F (36.9 C) (Oral)    Resp 16    Wt 83.9 kg    SpO2 99%    BMI 33.83 kg/m   Constitutional :  alert, cooperative, appears stated age, and no distress  Lymphatics/Throat:  no asymmetry, masses, or scars  Respiratory:  clear to auscultation bilaterally  Cardiovascular:  regular rate and rhythm  Gastrointestinal: soft, non-tender; bowel sounds normal; no masses,  no organomegaly.  Musculoskeletal: Steady gait and movement  Skin: Cool and moist, see pic below.  Some TTP on exam, but no obvious discharge, fluctuance palpable. Two areas of necrosis noted in picture. Right calf area with area of necrotic tissue as well, covered with dressing.  Heel not assessed.  Also noted severe bruising and hematoma formation on all four extremities.  Psychiatric: Normal affect, non-agitated, not confused          LABS:  CMP Latest Ref Rng & Units 07/27/2021 07/26/2021 07/25/2021  Glucose 70 - 99 mg/dL 92 115(H) 146(H)  BUN 8 - 23  mg/dL 29(H) 33(H) 37(H)  Creatinine 0.44 - 1.00 mg/dL 1.06(H) 1.10(H) 1.51(H)  Sodium 135 - 145 mmol/L 140 140 141  Potassium 3.5 - 5.1 mmol/L 3.6 3.9 3.8  Chloride 98 - 111 mmol/L 110 106 108  CO2 22 - 32 mmol/L 27 27 25   Calcium 8.9 - 10.3 mg/dL 7.9(L) 8.1(L) 8.2(L)  Total Protein 6.5 - 8.1 g/dL - - 8.5(H)  Total Bilirubin 0.3 - 1.2 mg/dL - - 0.9  Alkaline Phos 38 - 126 U/L - - 71  AST 15 - 41 U/L - - 50(H)  ALT 0 - 44 U/L - - 23   CBC Latest Ref Rng & Units 07/27/2021 07/26/2021 07/25/2021  WBC 4.0 - 10.5 K/uL 12.8(H) 14.6(H) 13.8(H)  Hemoglobin 12.0 - 15.0 g/dL 9.6(L) 10.2(L) 11.6(L)  Hematocrit 36.0 - 46.0 % 30.4(L) 33.4(L) 37.4  Platelets 150 - 400 K/uL 178 180 195    RADS: N/a  Assessment:      Pressure ulcers, right heel, right calf, sacral area. Bruising and hematomas secondary to supratherapeutic INR  Plan:     Pt cannot undergo any debridement at this point due to supratherapeutic INR.  She also has a very poor prognosis of any adequate healing at the sites with a recurrent clinical state of immobility as reported by her son.  Her dementia is advanced to the point where she will unlikely be able to follow commands and undergo PT, or regain adequate mobility.  We did briefly discuss amputation as a possible option for her right leg due to the extensive debridement that would be needed if we stick with local wound care.  Sons at bedside were not quite ready to commit to this nor have a discussion about palliative care.  I left the conversation stating that we still have some time for INR to decrease before we can proceed with any procedures so this will allow them time to reconsider all options as discussed above.  The wounds at this point do not look obviously infected or contributing significantly to her overall septic picture.  Continue supportive care for now per medical team

## 2021-07-27 NOTE — Evaluation (Signed)
Occupational Therapy Evaluation Patient Details Name: Rhonda Saunders MRN: [id number] DOB: [birth year] Today's Date: 07/27/2021   History of Present Illness Rhonda Saunders is a 88 y.o. female with history of CHF, COPD, hypertension, diabetes, hyperlipidemia, dementia, aortic valve replacement on Coumadin who presents to the emergency department for concerns for couple of days of generalized weakness, worsening mental status, subjective fevers. She has wounds to both of her legs and to her buttocks. with foul-smelling drainage. Pt is complaining of pain to both of these areas.   Clinical Impression   Rhonda Saunders presents today with generalized weakness, severe pain, cognitive impairment, dependence in ADL performance, and skin breakdown. She has tremors and open, bleeding wounds on bilateral UE, more significant on R. She has edema; dry, flaky skin; and open wounds on bilateral LE, more significant on R. Pt is able to provide her name and state she is in a hospital. She provides limited, perhaps questionable, information about her living situation and PLOF; no family member present to confirm. Pt is unable to roll in bed, can lift her forearms ~ 6" off bed but is unable to raise her upper arms from mattress. Pt can point and flex, but cannot roll, ankles. She can flex L knee ~ 10 degrees, cannot mobilize R knee. Pt cries out in pain with the slightest movement of her R LE. Recommend OT while pt is hospitalized, with DC to SNF post-hospitalization.    Recommendations for follow up therapy are one component of a multi-disciplinary discharge planning process, led by the attending physician.  Recommendations may be updated based on patient status, additional functional criteria and insurance authorization.   Follow Up Recommendations  Skilled nursing-short term rehab (<3 hours/day)    Assistance Recommended at Discharge Frequent or constant Supervision/Assistance  Patient can return home with the following A lot  of help with bathing/dressing/bathroom;Assistance with cooking/housework;Direct supervision/assist for medications management;Assistance with feeding;A lot of help with walking and/or transfers    Functional Status Assessment  Patient has had a recent decline in their functional status and demonstrates the ability to make significant improvements in function in a reasonable and predictable amount of time.  Equipment Recommendations  Other (comment) (Pt is unable to state what equipment she has at home currently)    Recommendations for Other Services       Precautions / Restrictions Precautions Precautions: Fall Restrictions Weight Bearing Restrictions: No      Mobility Bed Mobility Overal bed mobility: Needs Assistance Bed Mobility: Rolling Rolling: Total assist         General bed mobility comments: Pt unable to move in bed    Transfers Overall transfer level: Needs assistance                 General transfer comment: unable      Balance Overall balance assessment: Needs assistance   Sitting balance-Leahy Scale: Zero Sitting balance - Comments: pt unable to move into sitting -- endorses intense pain with slightest movement of R LE     Standing balance-Leahy Scale: Zero                             ADL either performed or assessed with clinical judgement   ADL Overall ADL's : Needs assistance/impaired  Functional mobility during ADLs: Total assistance;Maximal assistance General ADL Comments: Anticipate that pt is Max-Total A for all ADL     Vision         Perception     Praxis      Pertinent Vitals/Pain Pain Assessment: Faces Faces Pain Scale: Hurts worst Breathing: occasional labored breathing, short period of hyperventilation Negative Vocalization: repeated troubled calling out, loud moaning/groaning, crying Facial Expression: facial grimacing Body Language: tense, distressed  pacing, fidgeting Consolability: unable to console, distract or reassure PAINAD Score: 8 Pain Location: R LE Pain Intervention(s): Repositioned;Monitored during session     Hand Dominance     Extremity/Trunk Assessment Upper Extremity Assessment Upper Extremity Assessment: Generalized weakness   Lower Extremity Assessment Lower Extremity Assessment: Generalized weakness       Communication Communication Communication: Expressive difficulties;HOH   Cognition Arousal/Alertness: Awake/alert Behavior During Therapy: Agitated;Anxious Overall Cognitive Status: History of cognitive impairments - at baseline                                 General Comments: Oriented to self and is able to state that she is at "a hospital"     General Comments  open, seeping, bleeding wounds on b/l UE and LE; UE tremors    Exercises Other Exercises Other Exercises: provided educ in breathing, relaxation techniques for pain mgmt; repositioned in bed for comfort and pressure relief   Shoulder Instructions      Home Living Family/patient expects to be discharged to:: Private residence Living Arrangements: Children Available Help at Discharge: Family Type of Home: House                           Additional Comments: Pt states she lives in a home with her 2 sons, but is unable to provide more details. No family member available at present to offer additional information.      Prior Functioning/Environment Prior Level of Function : Patient poor historian/Family not available             Mobility Comments: Pt reports she ambulates at home with a walke, but therapist questions the accuracy of this statement. ADLs Comments: Pt reports that she performs dressing, toileting, and showering IND, but therapist questions the accuracy of this statement.        OT Problem List: Decreased strength;Impaired balance (sitting and/or standing);Decreased cognition;Pain;Decreased  range of motion;Decreased activity tolerance;Decreased coordination;Impaired UE functional use      OT Treatment/Interventions: Self-care/ADL training;DME and/or AE instruction;Therapeutic activities;Balance training;Therapeutic exercise;Energy conservation;Patient/family education    OT Goals(Current goals can be found in the care plan section) Acute Rehab OT Goals Patient Stated Goal: to not be in pain OT Goal Formulation: With patient Time For Goal Achievement: 08/10/21 Potential to Achieve Goals: Good ADL Goals Pt Will Perform Grooming: with mod assist;bed level;sitting (in sitting as able) Pt Will Perform Upper Body Dressing: with mod assist;sitting;bed level (in sitting or supine, as able) Pt/caregiver will Perform Home Exercise Program: Increased ROM;Increased strength;Right Upper extremity;Left upper extremity;With minimal assist  OT Frequency: Min 2X/week    Co-evaluation              AM-PAC OT "6 Clicks" Daily Activity     Outcome Measure Help from another person eating meals?: A Lot Help from another person taking care of personal grooming?: Total Help from another person toileting, which includes using toliet, bedpan, or urinal?:  Total Help from another person bathing (including washing, rinsing, drying)?: Total Help from another person to put on and taking off regular upper body clothing?: A Lot Help from another person to put on and taking off regular lower body clothing?: Total 6 Click Score: 8   End of Session    Activity Tolerance: Patient limited by pain;Patient limited by fatigue;Treatment limited secondary to medical complications (Comment) Patient left: in bed;with call bell/phone within reach  OT Visit Diagnosis: Muscle weakness (generalized) (M62.81);Pain;Feeding difficulties (R63.3)                Time: 5844-1712 OT Time Calculation (min): 24 min Charges:  OT General Charges $OT Visit: 1 Visit OT Evaluation $OT Eval Moderate Complexity: 1 Mod OT  Treatments $Self Care/Home Management : 23-37 mins Josiah Lobo, PhD, MS, OTR/L 07/27/21, 3:50 PM

## 2021-07-27 NOTE — Progress Notes (Signed)
ANTICOAGULATION CONSULT NOTE  Pharmacy Consult for warfarin Indication:  mechanical valve  Patient Measurements: Weight: 83.9 kg (184 lb 15.5 oz)  Vital Signs: Temp: 98.2 F (36.8 C) (01/06 0844) Temp Source: Oral (01/06 0504) BP: 156/53 (01/06 0844) Pulse Rate: 80 (01/06 0844)  Labs: Recent Labs    07/25/21 1426 07/26/21 0807 07/27/21 0425  HGB 11.6* 10.2* 9.6*  HCT 37.4 33.4* 30.4*  PLT 195 180 178  LABPROT 35.8* 47.5* 52.9*  INR 3.6* 5.2* 5.9*  CREATININE 1.51* 1.10* 1.06*     Estimated Creatinine Clearance: 37.5 mL/min (A) (by C-G formula based on SCr of 1.06 mg/dL (H)).   Medical History: Past Medical History:  Diagnosis Date   CHF (congestive heart failure) ([facility])    COPD (chronic obstructive pulmonary disease) ([facility])    Coronary artery disease    Dementia ([facility])    Diabetes mellitus without complication ([place])    Hyperlipidemia    Hypertension       Assessment: 88 year old female with altered mental status. Patient presented with INR 3.6 that has since trended up. Patient on warfarin for mechanical valve. Per review of Care Everywhere, it looks like she has had several supratherapeutic INRs in the past month.  home warfarin regimen: 5 mg daily   Goal of Therapy:  INR 2.5-3.5 Monitor platelets by anticoagulation protocol: Yes   Plan:  --INR supratherapeutic, still trending up --HOLD warfarin tonight --Patient may need decrease in home dose of warfarin going forward --INR with morning labs  Dallie Piles, PharmD, BCPS Clinical Pharmacist 07/27/2021 8:53 AM

## 2021-07-28 DIAGNOSIS — L089 Local infection of the skin and subcutaneous tissue, unspecified: Secondary | ICD-10-CM | POA: Diagnosis not present

## 2021-07-28 DIAGNOSIS — L8990 Pressure ulcer of unspecified site, unspecified stage: Secondary | ICD-10-CM | POA: Diagnosis not present

## 2021-07-28 LAB — BASIC METABOLIC PANEL
Anion gap: 8 (ref 5–15)
BUN: 24 mg/dL — ABNORMAL HIGH (ref 8–23)
CO2: 24 mmol/L (ref 22–32)
Calcium: 8 mg/dL — ABNORMAL LOW (ref 8.9–10.3)
Chloride: 108 mmol/L (ref 98–111)
Creatinine, Ser: 0.97 mg/dL (ref 0.44–1.00)
GFR, Estimated: 57 mL/min — ABNORMAL LOW (ref >60)
Glucose, Bld: 98 mg/dL (ref 70–99)
Potassium: 3.1 mmol/L — ABNORMAL LOW (ref 3.5–5.1)
Sodium: 140 mmol/L (ref 135–145)

## 2021-07-28 LAB — CBC
HCT: 28.5 % — ABNORMAL LOW (ref 36.0–46.0)
Hemoglobin: 9.2 g/dL — ABNORMAL LOW (ref 12.0–15.0)
MCH: 29.7 pg (ref 26.0–34.0)
MCHC: 32.3 g/dL (ref 30.0–36.0)
MCV: 91.9 fL (ref 80.0–100.0)
Platelets: 195 10*3/uL (ref 150–400)
RBC: 3.1 MIL/uL — ABNORMAL LOW (ref 3.87–5.11)
RDW: 19.2 % — ABNORMAL HIGH (ref 11.5–15.5)
WBC: 10.4 10*3/uL (ref 4.0–10.5)
nRBC: 0.3 % — ABNORMAL HIGH (ref 0.0–0.2)

## 2021-07-28 LAB — PROTIME-INR
INR: 5.5 (ref 0.8–1.2)
Prothrombin Time: 50.2 seconds — ABNORMAL HIGH (ref 11.4–15.2)

## 2021-07-28 LAB — MAGNESIUM: Magnesium: 2.1 mg/dL (ref 1.7–2.4)

## 2021-07-28 MED ORDER — POTASSIUM CHLORIDE 20 MEQ PO PACK
40.0000 meq | PACK | Freq: Once | ORAL | Status: AC
  Administered 2021-07-28: 40 meq via ORAL
  Filled 2021-07-28: qty 2

## 2021-07-28 NOTE — Progress Notes (Signed)
ANTICOAGULATION CONSULT NOTE  Pharmacy Consult for warfarin Indication:  mechanical valve  Patient Measurements: Weight: 83.9 kg (184 lb 15.5 oz)  Vital Signs: Temp: 98.4 F (36.9 C) (01/07 0840) Temp Source: Oral (01/07 0840) BP: 172/84 (01/07 0840) Pulse Rate: 85 (01/07 0840)  Labs: Recent Labs    07/26/21 0807 07/27/21 0425 07/28/21 0357  HGB 10.2* 9.6* 9.2*  HCT 33.4* 30.4* 28.5*  PLT 180 178 195  LABPROT 47.5* 52.9* 50.2*  INR 5.2* 5.9* 5.5*  CREATININE 1.10* 1.06* 0.97     Estimated Creatinine Clearance: 41 mL/min (by C-G formula based on SCr of 0.97 mg/dL).   Medical History: Past Medical History:  Diagnosis Date   CHF (congestive heart failure) (H[facility])    COPD (chronic obstructive pulmonary disease) ([facility])    Coronary artery disease    Dementia (HCC)    Diabetes mellitus without complication ([place])    Hyperlipidemia    Hypertension       Assessment: 88 year old female with altered mental status. Patient presented with INR 3.6 that has since trended up. Patient on warfarin for mechanical valve. Per review of Care Everywhere, it looks like she has had several supratherapeutic INRs in the past month.  home warfarin regimen: 5 mg daily  Date INR Warfarin Dose  1/6 5.9 HELD  1/7 5.5 HELD   DDI: ceftriaxone  Goal of Therapy:  INR 2.5-3.5 Monitor platelets by anticoagulation protocol: Yes   Plan:  INR is supratherapeutic. Will hold warfarin tonight. Restart once INR < 3.5. Currently on abx. Daily INR. CBC at least every 3 days. Patient may need decrease in home dose of warfarin at discharge.   Oswald Hillock, PharmD, BCPS Clinical Pharmacist 07/28/2021 9:36 AM

## 2021-07-28 NOTE — Progress Notes (Signed)
Patient ID: Rhonda Saunders, female   DOB: 11-12-1931, 88 y.o.   MRN: [id number] Patient's son stated patient had new bruises on her head that he was not aware of. Bruises documented in assessment.   Haydee Salter, RN

## 2021-07-28 NOTE — Progress Notes (Signed)
PT Cancellation Note  Patient Details Name: Rhonda Saunders MRN: [id number] DOB: 1931/10/10   Cancelled Treatment:    Reason Eval/Treat Not Completed: Patient not medically ready PT orders received, chart reviewed. Pt noted to have significantly elevated INR (5.5). Will hold PT evaluation at this time & f/u as able.   Lavone Nian, PT, DPT 07/28/21, 11:16 AM    [name] 07/28/2021, 11:16 AM

## 2021-07-28 NOTE — Progress Notes (Addendum)
Briefly discussed with son regarding goals of care.  They are to have a discussion with palliative care today, and I emphasized that surgery can still be an option but prognosis still not good long-term.  We will have to wait a few more days until INR has decreased enough to safe levels to proceed if son insist on surgery at that time.  Surgery will continue to be available, recommend continuing local wound care per wound care consult as previously noted

## 2021-07-28 NOTE — Progress Notes (Signed)
PROGRESS NOTE    Rhonda Saunders  QQI:[id number] DOB: 30-Dec-1931 DOA: 07/26/2021 PCP: Adin Hector, MD  125A/125A-AA   Assessment & Plan:   Principal Problem:   Sepsis [facility]) Active Problems:   Coronary artery disease   Chronic diastolic CHF (congestive heart failure) ([place])   Essential hypertension   Hx of aortic valve replacement, mechanical   Anticoagulated on warfarin   Senile dementia without behavioral disturbance ([place])   Type 2 diabetes, diet controlled (HCC)   Chronic pain syndrome   Lower limb ulcer, ankle, right, with fat layer exposed ([place])   Decubitus ulcer, infected, unspecified pressure ulcer stage   Cellulitis   COPD (chronic obstructive pulmonary disease) ([place])   Stage 3a chronic kidney disease ([place])   Supratherapeutic INR   Acute metabolic encephalopathy   Functional quadriplegia (HCC)   Rhonda Saunders is a 88 y.o. female with medical history significant for HTN, CAD, diastolic CHF, aortic valve replacement on Coumadin, asthma, DM, dementia, chronic pain, ischial decubitus ulcers, chronic venous stasis ulcers on Un[place] boots, who was brought to the ED with a complaint of altered mental status, weakness, foul odor from wounds on buttock and concern for possible UTI.   Sepsis, ruled out --does not meet criteria    Decubitus ulcer buttock, infected, unspecified pressure ulcer stage   Cellulitis buttock - Elevated WBC with lactic acid 2.6-2.6 without fever or tachycardia - received vanc, cefepime on admission --GenSurg consulted Plan: --cont vanc and ceftriaxone --wound care consult and dressing orders --family to decide on surgical I/D  AMS and lethargy --possibly due to too much scheduled opioids and gabapentin --resume home oxycontin 10 mg as BID PRN  Venous stasis with ulcers posterior right calf --wound care consult and dressing orders   Functional quadriplegia Obesity - PT and OT consult     Coronary artery disease - Appears stable, without  chest pain and EKG nonacute --cont home statin     Chronic diastolic CHF (congestive heart failure) (HCC) - Appears euvolemic     Essential hypertension - cont home Toprol     Hx of aortic valve replacement, mechanical   Supratherapeutic INR, anticoagulated on warfarin --INR 3.6 on presentation, went up to 5.9 morning of 1/6 without receiving warfarin --hold warfarin - Pharmacy to manage     Senile dementia without behavioral disturbance ([place]) - Continue Aricept - Delirium precautions - Haldol as needed     Type 2 diabetes, diet controlled (HCC) - Hemoglobin A1c 5.8 --d/c'ed BG checks and SSI     Chronic pain syndrome/neuropathic pain --cont home gabapentin --cont home oxycontin 10 mg as BID PRN      COPD (chronic obstructive pulmonary disease) (HCC) - Not acutely exacerbated - Ventolin as needed     Stage 3a chronic kidney disease (HCC)   DVT prophylaxis: HE:[id number] Code Status: Full code son now willing to have palliative consult, order placed  Family Communication: son updated at bedside today  Level of care: Med-Surg Dispo:   The patient is from: home Anticipated d/c is to: SNF Anticipated d/c date is: undetermined Patient currently is not medically ready to d/c due to: possible surgical I/D, waiting on INR to come down   Subjective and Interval History:  Pt more sleepy today.  Son reported pt eating less.  Son pointed out 3 new little bumps/bruises over pt's left forehead.   Objective: Vitals:   07/28/21 0019 07/28/21 0443 07/28/21 0840 07/28/21 1146  BP: (!) 150/83 136/62 (!) 172/84 138/65  Pulse: 74 72 85 70  Resp: 16 16 15 15   Temp: 98.6 F (37 C) 98.5 F (36.9 C) 98.4 F (36.9 C) 98.2 F (36.8 C)  TempSrc:  Oral Oral Oral  SpO2: 96% 97% 97% 99%  Weight:        Intake/Output Summary (Last 24 hours) at 07/28/2021 1553 Last data filed at 07/28/2021 1500 Gross per 24 hour  Intake 450 ml  Output 300 ml  Net 150 ml   Filed Weights   07/26/21  0301  Weight: 83.9 kg    Examination:   Constitutional: NAD, somnolent CV: No cyanosis.   RESP: normal respiratory effort, on RA SKIN: warm, dry   Data Reviewed: I have personally reviewed following labs and imaging studies  CBC: Recent Labs  Lab 07/25/21 1426 07/26/21 0807 07/27/21 0425 07/28/21 0357  WBC 13.8* 14.6* 12.8* 10.4  NEUTROABS 10.7*  --   --   --   HGB 11.6* 10.2* 9.6* 9.2*  HCT 37.4 33.4* 30.4* 28.5*  MCV 94.7 93.8 92.7 91.9  PLT 195 180 178 322   Basic Metabolic Panel: Recent Labs  Lab 07/25/21 1426 07/26/21 0807 07/27/21 0425 07/28/21 0357  NA 141 140 140 140  K 3.8 3.9 3.6 3.1*  CL 108 106 110 108  CO2 25 27 27 24   GLUCOSE 146* 115* 92 98  BUN 37* 33* 29* 24*  CREATININE 1.51* 1.10* 1.06* 0.97  CALCIUM 8.2* 8.1* 7.9* 8.0*  MG  --   --  2.1 2.1   GFR: Estimated Creatinine Clearance: 41 mL/min (by C-G formula based on SCr of 0.97 mg/dL). Liver Function Tests: Recent Labs  Lab 07/25/21 1426  AST 50*  ALT 23  ALKPHOS 71  BILITOT 0.9  PROT 8.5*  ALBUMIN 2.6*   No results for input(s): LIPASE, AMYLASE in the last 168 hours. No results for input(s): AMMONIA in the last 168 hours. Coagulation Profile: Recent Labs  Lab 07/25/21 1426 07/26/21 0807 07/27/21 0425 07/28/21 0357  INR 3.6* 5.2* 5.9* 5.5*   Cardiac Enzymes: No results for input(s): CKTOTAL, CKMB, CKMBINDEX, TROPONINI in the last 168 hours. BNP (last 3 results) No results for input(s): PROBNP in the last 8760 hours. HbA1C: Recent Labs    07/26/21 0807  HGBA1C 5.8*   CBG: Recent Labs  Lab 07/26/21 2107 07/27/21 0841 07/27/21 1215 07/27/21 1716 07/27/21 2059  GLUCAP 104* 87 90 111* 106*   Lipid Profile: No results for input(s): CHOL, HDL, LDLCALC, TRIG, CHOLHDL, LDLDIRECT in the last 72 hours. Thyroid Function Tests: No results for input(s): TSH, T4TOTAL, FREET4, T3FREE, THYROIDAB in the last 72 hours. Anemia Panel: No results for input(s): VITAMINB12,  FOLATE, FERRITIN, TIBC, IRON, RETICCTPCT in the last 72 hours. Sepsis Labs: Recent Labs  Lab 07/25/21 1425 07/25/21 1605 07/26/21 0807  PROCALCITON 0.12  --   --   LATICACIDVEN 2.6* 2.6* 1.7    Recent Results (from the past 240 hour(s))  Culture, blood (Routine x 2)     Status: None (Preliminary result)   Collection Time: 07/25/21  2:25 PM   Specimen: BLOOD  Result Value Ref Range Status   Specimen Description BLOOD RIGHT ANTECUBITAL  Final   Special Requests   Final    BOTTLES DRAWN AEROBIC AND ANAEROBIC Blood Culture adequate volume   Culture   Final    NO GROWTH 3 DAYS Performed at [facility], [address], [place], [place] [address]    Report Status PENDING  Incomplete  Culture, blood (Routine  x 2)     Status: None (Preliminary result)   Collection Time: 07/25/21  4:22 PM   Specimen: BLOOD  Result Value Ref Range Status   Specimen Description BLOOD BLOOD RIGHT WRIST  Final   Special Requests   Final    BOTTLES DRAWN AEROBIC AND ANAEROBIC Blood Culture results may not be optimal due to an inadequate volume of blood received in culture bottles   Culture   Final    NO GROWTH 3 DAYS Performed at [facility], [address], [place], [place] [address]    Report Status PENDING  Incomplete  Resp Panel by RT-PCR (Flu A&B, Covid) Nasopharyngeal Swab     Status: None   Collection Time: 07/26/21  1:44 AM   Specimen: Nasopharyngeal Swab; Nasopharyngeal(NP) swabs in vial transport medium  Result Value Ref Range Status   SARS Coronavirus 2 by RT PCR NEGATIVE NEGATIVE Final    Comment: (NOTE) SARS-CoV-2 target nucleic acids are NOT DETECTED.  The SARS-CoV-2 RNA is generally detectable in upper respiratory specimens during the acute phase of infection. The lowest concentration of SARS-CoV-2 viral copies this assay can detect is 138 copies/mL. A negative result does not preclude SARS-Cov-2 infection and should not be used as the sole basis for treatment  or other patient management decisions. A negative result may occur with  improper specimen collection/handling, submission of specimen other than nasopharyngeal swab, presence of viral mutation(s) within the areas targeted by this assay, and inadequate number of viral copies(<138 copies/mL). A negative result must be combined with clinical observations, patient history, and epidemiological information. The expected result is Negative.  Fact Sheet for Patients:  EntrepreneurPulse.com.au  Fact Sheet for Healthcare Providers:  IncredibleEmployment.be  This test is no t yet approved or cleared by the [place] FDA and  has been authorized for detection and/or diagnosis of SARS-CoV-2 by FDA under an Emergency Use Authorization (EUA). This EUA will remain  in effect (meaning this test can be used) for the duration of the COVID-19 declaration under Section 564(b)(1) of the Act, 21 U.S.C.section 360bbb-3(b)(1), unless the authorization is terminated  or revoked sooner.       Influenza A by PCR NEGATIVE NEGATIVE Final   Influenza B by PCR NEGATIVE NEGATIVE Final    Comment: (NOTE) The Xpert Xpress SARS-CoV-2/FLU/RSV plus assay is intended as an aid in the diagnosis of influenza from Nasopharyngeal swab specimens and should not be used as a sole basis for treatment. Nasal washings and aspirates are unacceptable for Xpert Xpress SARS-CoV-2/FLU/RSV testing.  Fact Sheet for Patients: EntrepreneurPulse.com.au  Fact Sheet for Healthcare Providers: IncredibleEmployment.be  This test is not yet approved or cleared by the [place] FDA and has been authorized for detection and/or diagnosis of SARS-CoV-2 by FDA under an Emergency Use Authorization (EUA). This EUA will remain in effect (meaning this test can be used) for the duration of the COVID-19 declaration under Section 564(b)(1) of the Act, 21 U.S.C. section  360bbb-3(b)(1), unless the authorization is terminated or revoked.  Performed at [facility], [address] [place], [place] [address]   Urine Culture     Status: Abnormal   Collection Time: 07/26/21  5:12 AM   Specimen: Urine, Clean Catch  Result Value Ref Range Status   Specimen Description   Final    URINE, CLEAN CATCH Performed at [facility], [address] [place], [place] [address]    Special Requests   Final    NONE Performed at [facility], [address] [place], [place] [address]    Culture MULTIPLE SPECIES PRESENT, SUGGEST RECOLLECTION (A)  Final   Report Status 07/27/2021 FINAL  Final      Radiology Studies: No results found.   Scheduled Meds:  atorvastatin  80 mg Oral Daily   collagenase   Topical Daily   donepezil  5 mg Oral QHS   fluticasone furoate-vilanterol  1 puff Inhalation Daily   gabapentin  600 mg Oral BID   levothyroxine  25 mcg Oral Q0600   metoprolol succinate  12.5 mg Oral Daily   polyethylene glycol  17 g Oral Daily   Warfarin - Pharmacist Dosing Inpatient   Does not apply q1600   Continuous Infusions:  cefTRIAXone (ROCEPHIN)  IV 1 g (07/27/21 1934)   vancomycin 1,250 mg (07/28/21 0531)     LOS: 2 days     Enzo Bi, MD [facility] If 7PM-7AM, please contact night-coverage 07/28/2021, 3:53 PM

## 2021-07-29 DIAGNOSIS — L089 Local infection of the skin and subcutaneous tissue, unspecified: Secondary | ICD-10-CM | POA: Diagnosis not present

## 2021-07-29 DIAGNOSIS — L8990 Pressure ulcer of unspecified site, unspecified stage: Secondary | ICD-10-CM | POA: Diagnosis not present

## 2021-07-29 LAB — BASIC METABOLIC PANEL
Anion gap: 5 (ref 5–15)
BUN: 18 mg/dL (ref 8–23)
CO2: 24 mmol/L (ref 22–32)
Calcium: 7.8 mg/dL — ABNORMAL LOW (ref 8.9–10.3)
Chloride: 109 mmol/L (ref 98–111)
Creatinine, Ser: 0.85 mg/dL (ref 0.44–1.00)
GFR, Estimated: >60 mL/min (ref >60)
Glucose, Bld: 99 mg/dL (ref 70–99)
Potassium: 3.6 mmol/L (ref 3.5–5.1)
Sodium: 138 mmol/L (ref 135–145)

## 2021-07-29 LAB — CBC
HCT: 27.3 % — ABNORMAL LOW (ref 36.0–46.0)
Hemoglobin: 8.6 g/dL — ABNORMAL LOW (ref 12.0–15.0)
MCH: 28.8 pg (ref 26.0–34.0)
MCHC: 31.5 g/dL (ref 30.0–36.0)
MCV: 91.3 fL (ref 80.0–100.0)
Platelets: 181 10*3/uL (ref 150–400)
RBC: 2.99 MIL/uL — ABNORMAL LOW (ref 3.87–5.11)
RDW: 19.3 % — ABNORMAL HIGH (ref 11.5–15.5)
WBC: 10.7 10*3/uL — ABNORMAL HIGH (ref 4.0–10.5)
nRBC: 0.5 % — ABNORMAL HIGH (ref 0.0–0.2)

## 2021-07-29 LAB — MAGNESIUM: Magnesium: 2.2 mg/dL (ref 1.7–2.4)

## 2021-07-29 LAB — PROTIME-INR
INR: 5.9 (ref 0.8–1.2)
Prothrombin Time: 52.8 seconds — ABNORMAL HIGH (ref 11.4–15.2)

## 2021-07-29 LAB — PREPARE RBC (CROSSMATCH)

## 2021-07-29 MED ORDER — SODIUM CHLORIDE 0.9% IV SOLUTION: Freq: Once | INTRAVENOUS | Status: DC

## 2021-07-29 MED ORDER — VANCOMYCIN HCL 1250 MG/250ML IV SOLN
1250.0000 mg | INTRAVENOUS | Status: AC
  Administered 2021-07-29 – 2021-07-31 (×2): 1250 mg via INTRAVENOUS
  Filled 2021-07-29 (×2): qty 250

## 2021-07-29 NOTE — Progress Notes (Signed)
ANTICOAGULATION CONSULT NOTE  Pharmacy Consult for warfarin Indication:  mechanical valve  Patient Measurements: Weight: 83.9 kg (184 lb 15.5 oz)  Vital Signs: Temp: 98.7 F (37.1 C) (01/08 0553) Temp Source: Oral (01/08 0553) BP: 153/57 (01/08 0553) Pulse Rate: 79 (01/08 0553)  Labs: Recent Labs    07/27/21 0425 07/28/21 0357 07/29/21 0519  HGB 9.6* 9.2* 8.6*  HCT 30.4* 28.5* 27.3*  PLT 178 195 181  LABPROT 52.9* 50.2* 52.8*  INR 5.9* 5.5* 5.9*  CREATININE 1.06* 0.97 0.85     Estimated Creatinine Clearance: 46.8 mL/min (by C-G formula based on SCr of 0.85 mg/dL).   Medical History: Past Medical History:  Diagnosis Date   CHF (congestive heart failure) (HCC)    COPD (chronic obstructive pulmonary disease) (HCC)    Coronary artery disease    Dementia (HCC)    Diabetes mellitus without complication ([place])    Hyperlipidemia    Hypertension       Assessment: 88 year old female with altered mental status. Patient presented with INR 3.6 that has since trended up. Patient on warfarin for mechanical valve. Per review of Care Everywhere, it looks like she has had several supratherapeutic INRs in the past month.  home warfarin regimen: 5 mg daily  Date INR Warfarin Dose  1/6 5.9 HELD  1/7 5.5 HELD  1/8 5.9 HELD   DDI: ceftriaxone  Goal of Therapy:  INR 2.5-3.5 Monitor platelets by anticoagulation protocol: Yes   Plan:  INR is supratherapeutic. Will hold warfarin tonight. Restart once INR < 3.5. Currently on abx. Recommended of possibly giving vitamin K to decrease INR. Some bleeding/bruising noted. Hgb slightly trending due to overall illness. Plan discussed with MD, if pt is to go to surgery will order reversal. Pt has mechanical valve, we do not want to have INR to become subtherapeutic. Daily INR. CBC at least every 3 days. Patient may need decrease in home dose of warfarin at discharge.   Oswald Hillock, PharmD, BCPS Clinical Pharmacist 07/29/2021 8:51  AM

## 2021-07-29 NOTE — Progress Notes (Signed)
PT Cancellation Note  Patient Details Name: Rhonda Saunders MRN: [id number] DOB: Feb 08, 1932   Cancelled Treatment:    Reason Eval/Treat Not Completed: Patient not medically ready PT orders received, chart reviewed. Pt noted to still have critically high INR (5.9 up from 5.5 yesterday). Will hold PT evaluation until INR in more appropriate range.   Lavone Nian, PT, DPT 07/29/21, 7:43 AM    [name] 07/29/2021, 7:42 AM

## 2021-07-29 NOTE — Progress Notes (Signed)
PROGRESS NOTE    Rhonda Saunders  YWV:[id number] DOB: 1932-01-12 DOA: 07/26/2021 PCP: Adin Hector, MD  125A/125A-AA   Assessment & Plan:   Principal Problem:   Sepsis [facility]) Active Problems:   Coronary artery disease   Chronic diastolic CHF (congestive heart failure) ([place])   Essential hypertension   Hx of aortic valve replacement, mechanical   Anticoagulated on warfarin   Senile dementia without behavioral disturbance ([place])   Type 2 diabetes, diet controlled (HCC)   Chronic pain syndrome   Lower limb ulcer, ankle, right, with fat layer exposed ([place])   Decubitus ulcer, infected, unspecified pressure ulcer stage   Cellulitis   COPD (chronic obstructive pulmonary disease) ([place])   Stage 3a chronic kidney disease ([place])   Supratherapeutic INR   Acute metabolic encephalopathy   Functional quadriplegia (HCC)   Rhonda Saunders is a 88 y.o. female with medical history significant for HTN, CAD, diastolic CHF, aortic valve replacement on Coumadin, asthma, DM, dementia, chronic pain, ischial decubitus ulcers, chronic venous stasis ulcers on Unna boots, who was brought to the ED with a complaint of altered mental status, weakness, foul odor from wounds on buttock and concern for possible UTI.   Sepsis, ruled out --does not meet criteria    Decubitus ulcer buttock unspecified pressure ulcer stage   Cellulitis buttock - Elevated WBC with lactic acid 2.6-2.6 without fever or tachycardia - received vanc, cefepime on admission --GenSurg consulted Plan: --cont vanc and ceftriaxone --wound care consult and dressing orders --air mattress  Right heel unstageble pressure ulcer Right lower leg venous stasis unstageble ulcer --GenSurg rec amputation --family to decide on surgical options --wound care and dressing change per orders  AMS and lethargy --possibly due to too much scheduled opioids and gabapentin --cont home oxycontin 10 mg as BID PRN     Hx of aortic valve replacement,  mechanical   Supratherapeutic INR, anticoagulated on warfarin --INR 3.6 on presentation, went up to 5.9 morning of 1/6 without receiving warfarin --hold warfarin, Pharmacy to manage --after considering pro and cons, I have decided not to reverse at this point.  Reversing INR means pt will have to be on heparin gtt for anticoagulation anyways, and the current bleeding seen from skin tears on her left hand will likely still be bleeding on standard level of anticoagulation.  And after reversal, often times it take at least a week to build back up to therapeutic INR.  Acute on chronic anemia  --Hgb 11.6 on presentation, gradually dropped to 8.6 this morning.  2g drop is common for inpatient elderly pts from blood draws, illness and reduced re-generation.  Pt did have some minor bleeding from skin tears that's contributing to blood loss. --Monitor Hgb and transfuse to keep Hgb >7  Functional quadriplegia Obesity - PT and OT consult     Coronary artery disease - Appears stable, without chest pain and EKG nonacute --cont home statin     Chronic diastolic CHF (congestive heart failure) (HCC) - Appears euvolemic     Essential hypertension - cont home Toprol     Senile dementia without behavioral disturbance (HCC) - Continue Aricept - Delirium precautions - Haldol as needed     Type 2 diabetes, diet controlled (HCC) - Hemoglobin A1c 5.8 --d/c'ed BG checks and SSI     Chronic pain syndrome/neuropathic pain --cont home gabapentin --cont home oxycontin 10 mg as BID PRN      COPD (chronic obstructive pulmonary disease) ([place]) - Not acutely exacerbated -  Ventolin as needed     Stage 3a chronic kidney disease (HCC)   DVT prophylaxis: YY:QMGNOIBB Code Status: Full code son now willing to have palliative consult, order placed  Family Communication: son updated at bedside today  Level of care: Med-Surg Dispo:   The patient is from: home Anticipated d/c is to: SNF Anticipated d/c date  is: undetermined Patient currently is not medically ready to d/c due to: possible surgical I/D, waiting on INR to come down   Subjective and Interval History:  Pt had new skin tears from left hand with swelling and bleeding.  No increased bleeding from sacral wound, right lower leg and right heel.   Objective: Vitals:   07/29/21 0047 07/29/21 0553 07/29/21 0854 07/29/21 1222  BP: (!) 157/61 (!) 153/57 (!) 127/49 (!) 134/49  Pulse: 82 79 79 73  Resp: 18 18 18 18   Temp: 98.9 F (37.2 C) 98.7 F (37.1 C) 98.4 F (36.9 C) (!) 97.5 F (36.4 C)  TempSrc: Oral Oral Oral Oral  SpO2: 95% 97% 98% 98%  Weight:        Intake/Output Summary (Last 24 hours) at 07/29/2021 1437 Last data filed at 07/29/2021 1000 Gross per 24 hour  Intake 690 ml  Output 800 ml  Net -110 ml   Filed Weights   07/26/21 0301  Weight: 83.9 kg    Examination:   Constitutional: NAD, alert HEENT: conjunctivae and lids normal, EOMI CV: No cyanosis.   RESP: normal respiratory effort, on RA Extremities: left hand swollen, with extensive bruising and skin tears where blood was oozing.  Photos taken on 07/29/21   Right heel       Data Reviewed: I have personally reviewed following labs and imaging studies  CBC: Recent Labs  Lab 07/25/21 1426 07/26/21 0807 07/27/21 0425 07/28/21 0357 07/29/21 0519  WBC 13.8* 14.6* 12.8* 10.4 10.7*  NEUTROABS 10.7*  --   --   --   --   HGB 11.6* 10.2* 9.6* 9.2* 8.6*  HCT 37.4 33.4* 30.4* 28.5* 27.3*  MCV 94.7 93.8 92.7 91.9 91.3  PLT 195 180 178 195 048   Basic Metabolic Panel: Recent Labs  Lab 07/25/21 1426 07/26/21 0807 07/27/21 0425 07/28/21 0357 07/29/21 0519  NA 141 140 140 140 138  K 3.8 3.9 3.6 3.1* 3.6  CL 108 106 110 108 109  CO2 25 27 27 24 24   GLUCOSE 146* 115* 92 98 99  BUN 37* 33* 29* 24* 18  CREATININE 1.51* 1.10* 1.06* 0.97 0.85  CALCIUM 8.2* 8.1* 7.9* 8.0* 7.8*  MG  --   --  2.1 2.1 2.2   GFR: Estimated Creatinine Clearance: 46.8  mL/min (by C-G formula based on SCr of 0.85 mg/dL). Liver Function Tests: Recent Labs  Lab 07/25/21 1426  AST 50*  ALT 23  ALKPHOS 71  BILITOT 0.9  PROT 8.5*  ALBUMIN 2.6*   No results for input(s): LIPASE, AMYLASE in the last 168 hours. No results for input(s): AMMONIA in the last 168 hours. Coagulation Profile: Recent Labs  Lab 07/25/21 1426 07/26/21 0807 07/27/21 0425 07/28/21 0357 07/29/21 0519  INR 3.6* 5.2* 5.9* 5.5* 5.9*   Cardiac Enzymes: No results for input(s): CKTOTAL, CKMB, CKMBINDEX, TROPONINI in the last 168 hours. BNP (last 3 results) No results for input(s): PROBNP in the last 8760 hours. HbA1C: No results for input(s): HGBA1C in the last 72 hours.  CBG: Recent Labs  Lab 07/26/21 2107 07/27/21 0841 07/27/21 1215 07/27/21 1716 07/27/21 2059  GLUCAP 104* 87 90 111* 106*   Lipid Profile: No results for input(s): CHOL, HDL, LDLCALC, TRIG, CHOLHDL, LDLDIRECT in the last 72 hours. Thyroid Function Tests: No results for input(s): TSH, T4TOTAL, FREET4, T3FREE, THYROIDAB in the last 72 hours. Anemia Panel: No results for input(s): VITAMINB12, FOLATE, FERRITIN, TIBC, IRON, RETICCTPCT in the last 72 hours. Sepsis Labs: Recent Labs  Lab 07/25/21 1425 07/25/21 1605 07/26/21 0807  PROCALCITON 0.12  --   --   LATICACIDVEN 2.6* 2.6* 1.7    Recent Results (from the past 240 hour(s))  Culture, blood (Routine x 2)     Status: None (Preliminary result)   Collection Time: 07/25/21  2:25 PM   Specimen: BLOOD  Result Value Ref Range Status   Specimen Description BLOOD RIGHT ANTECUBITAL  Final   Special Requests   Final    BOTTLES DRAWN AEROBIC AND ANAEROBIC Blood Culture adequate volume   Culture   Final    NO GROWTH 4 DAYS Performed at [facility], [address] [place], [place] [address]    Report Status PENDING  Incomplete  Culture, blood (Routine x 2)     Status: None (Preliminary result)   Collection Time: 07/25/21  4:22 PM    Specimen: BLOOD  Result Value Ref Range Status   Specimen Description BLOOD BLOOD RIGHT WRIST  Final   Special Requests   Final    BOTTLES DRAWN AEROBIC AND ANAEROBIC Blood Culture results may not be optimal due to an inadequate volume of blood received in culture bottles   Culture   Final    NO GROWTH 4 DAYS Performed at [facility], [address], [place], [place] [address]    Report Status PENDING  Incomplete  Resp Panel by RT-PCR (Flu A&B, Covid) Nasopharyngeal Swab     Status: None   Collection Time: 07/26/21  1:44 AM   Specimen: Nasopharyngeal Swab; Nasopharyngeal(NP) swabs in vial transport medium  Result Value Ref Range Status   SARS Coronavirus 2 by RT PCR NEGATIVE NEGATIVE Final    Comment: (NOTE) SARS-CoV-2 target nucleic acids are NOT DETECTED.  The SARS-CoV-2 RNA is generally detectable in upper respiratory specimens during the acute phase of infection. The lowest concentration of SARS-CoV-2 viral copies this assay can detect is 138 copies/mL. A negative result does not preclude SARS-Cov-2 infection and should not be used as the sole basis for treatment or other patient management decisions. A negative result may occur with  improper specimen collection/handling, submission of specimen other than nasopharyngeal swab, presence of viral mutation(s) within the areas targeted by this assay, and inadequate number of viral copies(<138 copies/mL). A negative result must be combined with clinical observations, patient history, and epidemiological information. The expected result is Negative.  Fact Sheet for Patients:  EntrepreneurPulse.com.au  Fact Sheet for Healthcare Providers:  IncredibleEmployment.be  This test is no t yet approved or cleared by the [place] FDA and  has been authorized for detection and/or diagnosis of SARS-CoV-2 by FDA under an Emergency Use Authorization (EUA). This EUA will remain  in effect  (meaning this test can be used) for the duration of the COVID-19 declaration under Section 564(b)(1) of the Act, 21 U.S.C.section 360bbb-3(b)(1), unless the authorization is terminated  or revoked sooner.       Influenza A by PCR NEGATIVE NEGATIVE Final   Influenza B by PCR NEGATIVE NEGATIVE Final    Comment: (NOTE) The Xpert Xpress SARS-CoV-2/FLU/RSV plus assay is intended as an aid in the diagnosis of  influenza from Nasopharyngeal swab specimens and should not be used as a sole basis for treatment. Nasal washings and aspirates are unacceptable for Xpert Xpress SARS-CoV-2/FLU/RSV testing.  Fact Sheet for Patients: EntrepreneurPulse.com.au  Fact Sheet for Healthcare Providers: IncredibleEmployment.be  This test is not yet approved or cleared by the [place] FDA and has been authorized for detection and/or diagnosis of SARS-CoV-2 by FDA under an Emergency Use Authorization (EUA). This EUA will remain in effect (meaning this test can be used) for the duration of the COVID-19 declaration under Section 564(b)(1) of the Act, 21 U.S.C. section 360bbb-3(b)(1), unless the authorization is terminated or revoked.  Performed at [facility], [address], [place], [place] [address]   Urine Culture     Status: Abnormal   Collection Time: 07/26/21  5:12 AM   Specimen: Urine, Clean Catch  Result Value Ref Range Status   Specimen Description   Final    URINE, CLEAN CATCH Performed at [facility] [address], [place], [place] [address]    Special Requests   Final    NONE Performed at [facility], [address] [place], [place] [address]    Culture MULTIPLE SPECIES PRESENT, SUGGEST RECOLLECTION (A)  Final   Report Status 07/27/2021 FINAL  Final      Radiology Studies: No results found.   Scheduled Meds:  atorvastatin  80 mg Oral Daily   collagenase   Topical Daily   donepezil  5 mg Oral QHS    fluticasone furoate-vilanterol  1 puff Inhalation Daily   gabapentin  600 mg Oral BID   levothyroxine  25 mcg Oral Q0600   metoprolol succinate  12.5 mg Oral Daily   polyethylene glycol  17 g Oral Daily   Warfarin - Pharmacist Dosing Inpatient   Does not apply q1600   Continuous Infusions:  cefTRIAXone (ROCEPHIN)  IV 1 g (07/28/21 2006)   vancomycin       LOS: 3 days     Enzo Bi, MD [facility] If 7PM-7AM, please contact night-coverage 07/29/2021, 2:37 PM

## 2021-07-29 NOTE — Consult Note (Signed)
Pharmacy Antibiotic Note  Rhonda Saunders is a 88 y.o. female admitted on 07/26/2021 with cellulitis. Patient with decubitus ulcer on buttock. Pharmacy has been consulted for vancomycin dosing. Renal function has improved noticeably since admission.  Plan: Pt is on vancomycin at 1250 mg Q48H adjusted to 1250 mg q36H due to slight improve with Scr. Per consult end date of 7 days placed.  Goal AUC 400-600 Estimated AUC 567 SCr 0.85 mg/dL Vd 0.5 If vancomycin continued longer than 7 days, would recommend to obtain vancomycin levels.   Weight: 83.9 kg (184 lb 15.5 oz)  Temp (24hrs), Avg:98.5 F (36.9 C), Min:98.2 F (36.8 C), Max:98.9 F (37.2 C)  Recent Labs  Lab 07/25/21 1425 07/25/21 1426 07/25/21 1605 07/26/21 0807 07/27/21 0425 07/28/21 0357 07/29/21 0519  WBC  --  13.8*  --  14.6* 12.8* 10.4 10.7*  CREATININE  --  1.51*  --  1.10* 1.06* 0.97 0.85  LATICACIDVEN 2.6*  --  2.6* 1.7  --   --   --      Estimated Creatinine Clearance: 46.8 mL/min (by C-G formula based on SCr of 0.85 mg/dL).    Allergies  Allergen Reactions   Pentazocine Other (See Comments)    Other Reaction: Not Assessed     Antimicrobials this admission: ceftriaxone 1/5 >>  vancomycin 1/5 >>   Dose adjustments this admission: 1/5 vancomycin 750 mg q48h >> 1250 mg q48h  Microbiology results: 1/5 BCx: NG x 2 days 1/5 UCx: contaminated 1/5 SARS CoV-2: negative 1/5 influenza A/B: negative   Thank you for allowing pharmacy to be a part of this patients care.  Oswald Hillock, PharmD, BCPS Clinical Pharmacist   07/29/2021 9:02 AM

## 2021-07-30 DIAGNOSIS — L089 Local infection of the skin and subcutaneous tissue, unspecified: Secondary | ICD-10-CM | POA: Diagnosis not present

## 2021-07-30 DIAGNOSIS — L8990 Pressure ulcer of unspecified site, unspecified stage: Secondary | ICD-10-CM | POA: Diagnosis not present

## 2021-07-30 LAB — CBC
HCT: 27.1 % — ABNORMAL LOW (ref 36.0–46.0)
Hemoglobin: 8.5 g/dL — ABNORMAL LOW (ref 12.0–15.0)
MCH: 28.8 pg (ref 26.0–34.0)
MCHC: 31.4 g/dL (ref 30.0–36.0)
MCV: 91.9 fL (ref 80.0–100.0)
Platelets: 189 10*3/uL (ref 150–400)
RBC: 2.95 MIL/uL — ABNORMAL LOW (ref 3.87–5.11)
RDW: 19 % — ABNORMAL HIGH (ref 11.5–15.5)
WBC: 10.9 10*3/uL — ABNORMAL HIGH (ref 4.0–10.5)
nRBC: 0.6 % — ABNORMAL HIGH (ref 0.0–0.2)

## 2021-07-30 LAB — BASIC METABOLIC PANEL WITH GFR
Anion gap: 4 — ABNORMAL LOW (ref 5–15)
BUN: 14 mg/dL (ref 8–23)
CO2: 24 mmol/L (ref 22–32)
Calcium: 7.6 mg/dL — ABNORMAL LOW (ref 8.9–10.3)
Chloride: 109 mmol/L (ref 98–111)
Creatinine, Ser: 0.75 mg/dL (ref 0.44–1.00)
GFR, Estimated: >60 mL/min
Glucose, Bld: 112 mg/dL — ABNORMAL HIGH (ref 70–99)
Potassium: 3.5 mmol/L (ref 3.5–5.1)
Sodium: 137 mmol/L (ref 135–145)

## 2021-07-30 LAB — CULTURE, BLOOD (ROUTINE X 2)
Culture: NO GROWTH
Culture: NO GROWTH
Special Requests: ADEQUATE

## 2021-07-30 LAB — MAGNESIUM: Magnesium: 2.2 mg/dL (ref 1.7–2.4)

## 2021-07-30 LAB — PROTIME-INR
INR: 4.9 (ref 0.8–1.2)
Prothrombin Time: 45.8 s — ABNORMAL HIGH (ref 11.4–15.2)

## 2021-07-30 MED ORDER — CALCIUM CARBONATE ANTACID 500 MG PO CHEW
1.0000 | CHEWABLE_TABLET | Freq: Three times a day (TID) | ORAL | Status: DC | PRN
  Administered 2021-07-30: 200 mg via ORAL
  Filled 2021-07-30: qty 1

## 2021-07-30 MED ORDER — ALUM & MAG HYDROXIDE-SIMETH 200-200-20 MG/5ML PO SUSP
15.0000 mL | Freq: Four times a day (QID) | ORAL | Status: DC | PRN
  Administered 2021-07-30: 19:00:00 15 mL via ORAL
  Filled 2021-07-30: qty 30

## 2021-07-30 MED ORDER — FUROSEMIDE 10 MG/ML IJ SOLN
40.0000 mg | Freq: Once | INTRAMUSCULAR | Status: AC
  Administered 2021-07-30: 20:00:00 40 mg via INTRAVENOUS
  Filled 2021-07-30: qty 4

## 2021-07-30 NOTE — Progress Notes (Addendum)
Skin tear to left hand bleeding copiously, dressing saturated with sanguineous drainage. Cleansed and new dressing applied. Extremity elevated.   Addendum 0600: dressing saturated, pooling of blood underneath hand with clots. Cleansed with saline and redressed.   Pt denies any lightheadedness. No change in mentation.

## 2021-07-30 NOTE — Progress Notes (Signed)
OT Cancellation Note  Patient Details Name: Rhonda Saunders MRN: [id number] DOB: August 27, 1931   Cancelled Treatment:    Reason Eval/Treat Not Completed: Medical issues which prohibited therapy. Pt noted with an INR of 4.9. Inappropriate for therapy at this time. Will re-attempt at later date/time as medically appropriate.   Ardeth Perfect., MPH, MS, OTR/L ascom [phone] 07/30/21, 4:42 PM

## 2021-07-30 NOTE — Progress Notes (Signed)
PROGRESS NOTE    Rhonda Saunders  TMH:[id number] DOB: [birth year] DOA: 07/26/2021 PCP: Adin Hector, MD  125A/125A-AA   Assessment & Plan:   Principal Problem:   Sepsis [facility]) Active Problems:   Coronary artery disease   Chronic diastolic CHF (congestive heart failure) ([place])   Essential hypertension   Hx of aortic valve replacement, mechanical   Anticoagulated on warfarin   Senile dementia without behavioral disturbance ([place])   Type 2 diabetes, diet controlled (HCC)   Chronic pain syndrome   Lower limb ulcer, ankle, right, with fat layer exposed ([place])   Decubitus ulcer, infected, unspecified pressure ulcer stage   Cellulitis   COPD (chronic obstructive pulmonary disease) ([place])   Stage 3a chronic kidney disease ([place])   Supratherapeutic INR   Acute metabolic encephalopathy   Functional quadriplegia (HCC)   Rhonda Saunders is a 88 y.o. female with medical history significant for HTN, CAD, diastolic CHF, aortic valve replacement on Coumadin, asthma, DM, dementia, chronic pain, ischial decubitus ulcers, chronic venous stasis ulcers on Unna boots, who was brought to the ED with a complaint of altered mental status, weakness, foul odor from wounds on buttock and concern for possible UTI.   Sepsis, ruled out --does not meet criteria    Decubitus ulcer buttock unspecified pressure ulcer stage   Cellulitis buttock - Elevated WBC with lactic acid 2.6-2.6 without fever or tachycardia - received vanc, cefepime on admission --GenSurg consulted Plan: --cont vanc and ceftriaxone --wound care consult and dressing orders --air mattress  Right heel unstageble pressure ulcer Right lower leg venous stasis unstageble ulcer --GenSurg rec amputation Plan: --family to decide on surgery after speaking with palliative care --wound care and dressing change per orders  AMS and lethargy --possibly due to too much scheduled opioids and gabapentin --cont home oxycontin 10 mg as BID PRN     Hx  of aortic valve replacement, mechanical   Supratherapeutic INR, anticoagulated on warfarin --INR 3.6 on presentation, went up to 5.9 morning of 1/6 without receiving warfarin --hold warfarin, Pharmacy to manage --after considering pro and cons, I have decided not to reverse at this point.  Reversing INR means pt will have to be on heparin gtt for anticoagulation anyways, and the current bleeding seen from skin tears on her left hand will likely still be bleeding on standard level of anticoagulation.  And after reversal, often times it take at least a week to build back up to therapeutic INR.  Acute on chronic anemia  --Hgb 11.6 on presentation, gradually dropped to 8.6 morning of 1/8.  2g drop is common for inpatient elderly pts from blood draws, illness and reduced re-generation.  Pt did have some minor bleeding from skin tears that's contributing to blood loss. --Monitor Hgb and transfuse to keep Hgb >7  Functional quadriplegia Obesity - PT and OT consult     Coronary artery disease - Appears stable, without chest pain and EKG nonacute --cont home statin     Chronic diastolic CHF (congestive heart failure) (HCC) - Appears euvolemic     Essential hypertension - cont home Toprol     Senile dementia without behavioral disturbance (HCC) - Continue Aricept - Delirium precautions - Haldol as needed     Type 2 diabetes, diet controlled (HCC) - Hemoglobin A1c 5.8 --d/c'ed BG checks and SSI     Chronic pain syndrome/neuropathic pain --cont home gabapentin --cont home oxycontin 10 mg as BID PRN      COPD (chronic obstructive pulmonary disease) (  [place]) - Not acutely exacerbated - Ventolin as needed     Stage 3a chronic kidney disease (HCC)   DVT prophylaxis: KA:[id number] Code Status: Full code son now willing to have palliative consult, order placed  Family Communication:   Level of care: Med-Surg Dispo:   The patient is from: home Anticipated d/c is to: SNF Anticipated d/c  date is: undetermined Patient currently is not medically ready to d/c due to: possible surgical I/D, waiting on family to make a decision after palliative consult   Subjective and Interval History:  Pt was awake, calm and communicative today.  denied pain.     Objective: Vitals:   07/30/21 0757 07/30/21 1237 07/30/21 1611 07/30/21 1738  BP: (!) 152/63 122/62 (!) 141/63   Pulse: 94 84 83   Resp: 17 17 19 18   Temp: 98.1 F (36.7 C) 98.2 F (36.8 C) 97.9 F (36.6 C)   TempSrc: Oral Oral Oral   SpO2: 96% 98% 98%   Weight:        Intake/Output Summary (Last 24 hours) at 07/30/2021 1751 Last data filed at 07/30/2021 1300 Gross per 24 hour  Intake 690.22 ml  Output 200 ml  Net 490.22 ml   Filed Weights   07/26/21 0301  Weight: 83.9 kg    Examination:   Constitutional: NAD, alert, more communicative today HEENT: conjunctivae and lids normal, EOMI CV: No cyanosis.   RESP: normal respiratory effort, on RA Extremities: left hand with dressing  Photos taken on 07/29/21   Right heel       Data Reviewed: I have personally reviewed following labs and imaging studies  CBC: Recent Labs  Lab 07/25/21 1426 07/26/21 0807 07/27/21 0425 07/28/21 0357 07/29/21 0519 07/30/21 0539  WBC 13.8* 14.6* 12.8* 10.4 10.7* 10.9*  NEUTROABS 10.7*  --   --   --   --   --   HGB 11.6* 10.2* 9.6* 9.2* 8.6* 8.5*  HCT 37.4 33.4* 30.4* 28.5* 27.3* 27.1*  MCV 94.7 93.8 92.7 91.9 91.3 91.9  PLT 195 180 178 195 181 620   Basic Metabolic Panel: Recent Labs  Lab 07/26/21 0807 07/27/21 0425 07/28/21 0357 07/29/21 0519 07/30/21 0539  NA 140 140 140 138 137  K 3.9 3.6 3.1* 3.6 3.5  CL 106 110 108 109 109  CO2 27 27 24 24 24   GLUCOSE 115* 92 98 99 112*  BUN 33* 29* 24* 18 14  CREATININE 1.10* 1.06* 0.97 0.85 0.75  CALCIUM 8.1* 7.9* 8.0* 7.8* 7.6*  MG  --  2.1 2.1 2.2 2.2   GFR: Estimated Creatinine Clearance: 49.7 mL/min (by C-G formula based on SCr of 0.75 mg/dL). Liver Function  Tests: Recent Labs  Lab 07/25/21 1426  AST 50*  ALT 23  ALKPHOS 71  BILITOT 0.9  PROT 8.5*  ALBUMIN 2.6*   No results for input(s): LIPASE, AMYLASE in the last 168 hours. No results for input(s): AMMONIA in the last 168 hours. Coagulation Profile: Recent Labs  Lab 07/26/21 0807 07/27/21 0425 07/28/21 0357 07/29/21 0519 07/30/21 0539  INR 5.2* 5.9* 5.5* 5.9* 4.9*   Cardiac Enzymes: No results for input(s): CKTOTAL, CKMB, CKMBINDEX, TROPONINI in the last 168 hours. BNP (last 3 results) No results for input(s): PROBNP in the last 8760 hours. HbA1C: No results for input(s): HGBA1C in the last 72 hours.  CBG: Recent Labs  Lab 07/26/21 2107 07/27/21 0841 07/27/21 1215 07/27/21 1716 07/27/21 2059  GLUCAP 104* 87 90 111* 106*   Lipid Profile: No  results for input(s): CHOL, HDL, LDLCALC, TRIG, CHOLHDL, LDLDIRECT in the last 72 hours. Thyroid Function Tests: No results for input(s): TSH, T4TOTAL, FREET4, T3FREE, THYROIDAB in the last 72 hours. Anemia Panel: No results for input(s): VITAMINB12, FOLATE, FERRITIN, TIBC, IRON, RETICCTPCT in the last 72 hours. Sepsis Labs: Recent Labs  Lab 07/25/21 1425 07/25/21 1605 07/26/21 0807  PROCALCITON 0.12  --   --   LATICACIDVEN 2.6* 2.6* 1.7    Recent Results (from the past 240 hour(s))  Culture, blood (Routine x 2)     Status: None   Collection Time: 07/25/21  2:25 PM   Specimen: BLOOD  Result Value Ref Range Status   Specimen Description BLOOD RIGHT ANTECUBITAL  Final   Special Requests   Final    BOTTLES DRAWN AEROBIC AND ANAEROBIC Blood Culture adequate volume   Culture   Final    NO GROWTH 5 DAYS Performed at [facility], [address] [place], [place] [address]    Report Status 07/30/2021 FINAL  Final  Culture, blood (Routine x 2)     Status: None   Collection Time: 07/25/21  4:22 PM   Specimen: BLOOD  Result Value Ref Range Status   Specimen Description BLOOD BLOOD RIGHT WRIST  Final   Special  Requests   Final    BOTTLES DRAWN AEROBIC AND ANAEROBIC Blood Culture results may not be optimal due to an inadequate volume of blood received in culture bottles   Culture   Final    NO GROWTH 5 DAYS Performed at [facility], [address] [place], [place] [address]    Report Status 07/30/2021 FINAL  Final  Resp Panel by RT-PCR (Flu A&B, Covid) Nasopharyngeal Swab     Status: None   Collection Time: 07/26/21  1:44 AM   Specimen: Nasopharyngeal Swab; Nasopharyngeal(NP) swabs in vial transport medium  Result Value Ref Range Status   SARS Coronavirus 2 by RT PCR NEGATIVE NEGATIVE Final    Comment: (NOTE) SARS-CoV-2 target nucleic acids are NOT DETECTED.  The SARS-CoV-2 RNA is generally detectable in upper respiratory specimens during the acute phase of infection. The lowest concentration of SARS-CoV-2 viral copies this assay can detect is 138 copies/mL. A negative result does not preclude SARS-Cov-2 infection and should not be used as the sole basis for treatment or other patient management decisions. A negative result may occur with  improper specimen collection/handling, submission of specimen other than nasopharyngeal swab, presence of viral mutation(s) within the areas targeted by this assay, and inadequate number of viral copies(<138 copies/mL). A negative result must be combined with clinical observations, patient history, and epidemiological information. The expected result is Negative.  Fact Sheet for Patients:  EntrepreneurPulse.com.au  Fact Sheet for Healthcare Providers:  IncredibleEmployment.be  This test is no t yet approved or cleared by the [place] FDA and  has been authorized for detection and/or diagnosis of SARS-CoV-2 by FDA under an Emergency Use Authorization (EUA). This EUA will remain  in effect (meaning this test can be used) for the duration of the COVID-19 declaration under Section 564(b)(1) of the Act,  21 U.S.C.section 360bbb-3(b)(1), unless the authorization is terminated  or revoked sooner.       Influenza A by PCR NEGATIVE NEGATIVE Final   Influenza B by PCR NEGATIVE NEGATIVE Final    Comment: (NOTE) The Xpert Xpress SARS-CoV-2/FLU/RSV plus assay is intended as an aid in the diagnosis of influenza from Nasopharyngeal swab specimens and should not be used as a sole  basis for treatment. Nasal washings and aspirates are unacceptable for Xpert Xpress SARS-CoV-2/FLU/RSV testing.  Fact Sheet for Patients: EntrepreneurPulse.com.au  Fact Sheet for Healthcare Providers: IncredibleEmployment.be  This test is not yet approved or cleared by the [place] FDA and has been authorized for detection and/or diagnosis of SARS-CoV-2 by FDA under an Emergency Use Authorization (EUA). This EUA will remain in effect (meaning this test can be used) for the duration of the COVID-19 declaration under Section 564(b)(1) of the Act, 21 U.S.C. section 360bbb-3(b)(1), unless the authorization is terminated or revoked.  Performed at [facility], [address], [place], [place] [address]   Urine Culture     Status: Abnormal   Collection Time: 07/26/21  5:12 AM   Specimen: Urine, Clean Catch  Result Value Ref Range Status   Specimen Description   Final    URINE, CLEAN CATCH Performed at [facility], [address], [place], [place] [address]    Special Requests   Final    NONE Performed at [facility], [address] [place], [place] [address]    Culture MULTIPLE SPECIES PRESENT, SUGGEST RECOLLECTION (A)  Final   Report Status 07/27/2021 FINAL  Final      Radiology Studies: No results found.   Scheduled Meds:  atorvastatin  80 mg Oral Daily   collagenase   Topical Daily   donepezil  5 mg Oral QHS   fluticasone furoate-vilanterol  1 puff Inhalation Daily   gabapentin  600 mg Oral BID   levothyroxine  25 mcg Oral  Q0600   metoprolol succinate  12.5 mg Oral Daily   polyethylene glycol  17 g Oral Daily   Warfarin - Pharmacist Dosing Inpatient   Does not apply q1600   Continuous Infusions:  cefTRIAXone (ROCEPHIN)  IV Stopped (07/30/21 0000)   vancomycin 1,250 mg (07/29/21 1830)     LOS: 4 days     Enzo Bi, MD [facility] If 7PM-7AM, please contact night-coverage 07/30/2021, 5:51 PM

## 2021-07-30 NOTE — Progress Notes (Addendum)
PT Cancellation Note  Patient Details Name: Rhonda Saunders MRN: [id number] DOB: [birth year]   Cancelled Treatment:    Reason Eval/Treat Not Completed: Patient not medically ready PT orders received, chart reviewed. Pt noted to have continued elevated INR (4.9) & not appropriate for PT evaluation at this time. Due to 3 failed evaluation attempts will cancel current PT orders. Please re-consult if pt becomes more medically stable & is appropriate for PT evaluation. Thanks.  Addendum: spoke with MD via secure chat who is aware that pt is not appropriate for PT at this time.   Lavone Nian, PT, DPT 07/30/21, 11:51 AM   [name] 07/30/2021, 8:54 AM

## 2021-07-30 NOTE — Progress Notes (Signed)
ANTICOAGULATION CONSULT NOTE  Pharmacy Consult for warfarin Indication:  mechanical valve  Patient Measurements: Weight: 83.9 kg (184 lb 15.5 oz)  Vital Signs: Temp: 98.1 F (36.7 C) (01/09 0757) Temp Source: Oral (01/09 0757) BP: 152/63 (01/09 0757) Pulse Rate: 94 (01/09 0757)  Labs: Recent Labs    07/28/21 0357 07/29/21 0519 07/30/21 0539  HGB 9.2* 8.6* 8.5*  HCT 28.5* 27.3* 27.1*  PLT 195 181 189  LABPROT 50.2* 52.8* 45.8*  INR 5.5* 5.9* 4.9*  CREATININE 0.97 0.85 0.75     Estimated Creatinine Clearance: 49.7 mL/min (by C-G formula based on SCr of 0.75 mg/dL).   Medical History: Past Medical History:  Diagnosis Date   CHF (congestive heart failure) (HCC)    COPD (chronic obstructive pulmonary disease) (HCC)    Coronary artery disease    Dementia (HCC)    Diabetes mellitus without complication ([place])    Hyperlipidemia    Hypertension       Assessment: 88 year old female with altered mental status. Patient presented with INR 3.6 that has since trended up. Patient on warfarin for mechanical valve. Per review of Care Everywhere, it looks like she has had several supratherapeutic INRs in the past month.  home warfarin regimen: 5 mg daily  Date INR Warfarin Dose  1/6 5.9 HELD  1/7 5.5 HELD  1/8 5.9 HELD  1/9 4.9 HOLD   DDI: ceftriaxone  Goal of Therapy:  INR 2.5-3.5 Monitor platelets by anticoagulation protocol: Yes   Plan:  INR remains supratherapeutic after holding warfarin x 3 days. Will continue to hold warfarin tonight. Restart once INR </=3.5.  Currently on abx. Recommended giving vitamin K to decrease INR. Some bleeding/bruising noted. Hgb slightly trending due to overall illness. Plan discussed with MD, if pt is to go to surgery will order reversal. Pt has mechanical valve, we do not want to have INR to become subtherapeutic. Noted Hgb stable in the lat 24hrs.  Daily INR already ordered. CBC at least every 3 days.    Melinna Linarez Rodriguez-Guzman  PharmD, BCPS 07/30/2021 8:41 AM

## 2021-07-30 NOTE — Progress Notes (Signed)
°   07/30/21 2633  Provider Notification  Provider Name/Title Jaci Carrel NP  Date Provider Notified 07/30/21  Time Provider Notified [phone]  Notification Type Page  Notification Reason Critical result  Test performed and critical result INR 4.9  Date Critical Result Received 07/30/21  Time Critical Result Received 0653  Provider response No new orders  Date of Provider Response 07/30/21  Time of Provider Response [phone]

## 2021-07-31 DIAGNOSIS — N1831 Chronic kidney disease, stage 3a: Secondary | ICD-10-CM

## 2021-07-31 DIAGNOSIS — L089 Local infection of the skin and subcutaneous tissue, unspecified: Secondary | ICD-10-CM | POA: Diagnosis not present

## 2021-07-31 DIAGNOSIS — I5032 Chronic diastolic (congestive) heart failure: Secondary | ICD-10-CM

## 2021-07-31 DIAGNOSIS — G894 Chronic pain syndrome: Secondary | ICD-10-CM

## 2021-07-31 DIAGNOSIS — I10 Essential (primary) hypertension: Secondary | ICD-10-CM

## 2021-07-31 DIAGNOSIS — J449 Chronic obstructive pulmonary disease, unspecified: Secondary | ICD-10-CM

## 2021-07-31 DIAGNOSIS — E119 Type 2 diabetes mellitus without complications: Secondary | ICD-10-CM

## 2021-07-31 DIAGNOSIS — L03317 Cellulitis of buttock: Principal | ICD-10-CM

## 2021-07-31 DIAGNOSIS — Z515 Encounter for palliative care: Secondary | ICD-10-CM

## 2021-07-31 DIAGNOSIS — Z789 Other specified health status: Secondary | ICD-10-CM

## 2021-07-31 DIAGNOSIS — L8990 Pressure ulcer of unspecified site, unspecified stage: Secondary | ICD-10-CM | POA: Diagnosis not present

## 2021-07-31 DIAGNOSIS — Z952 Presence of prosthetic heart valve: Secondary | ICD-10-CM

## 2021-07-31 DIAGNOSIS — Z7901 Long term (current) use of anticoagulants: Secondary | ICD-10-CM

## 2021-07-31 LAB — PROTIME-INR
INR: 3.3 — ABNORMAL HIGH (ref 0.8–1.2)
Prothrombin Time: 33.5 seconds — ABNORMAL HIGH (ref 11.4–15.2)

## 2021-07-31 LAB — BASIC METABOLIC PANEL
Anion gap: 4 — ABNORMAL LOW (ref 5–15)
BUN: 13 mg/dL (ref 8–23)
CO2: 25 mmol/L (ref 22–32)
Calcium: 7.6 mg/dL — ABNORMAL LOW (ref 8.9–10.3)
Chloride: 107 mmol/L (ref 98–111)
Creatinine, Ser: 0.75 mg/dL (ref 0.44–1.00)
GFR, Estimated: >60 mL/min (ref >60)
Glucose, Bld: 108 mg/dL — ABNORMAL HIGH (ref 70–99)
Potassium: 4.1 mmol/L (ref 3.5–5.1)
Sodium: 136 mmol/L (ref 135–145)

## 2021-07-31 LAB — CBC
HCT: 23.9 % — ABNORMAL LOW (ref 36.0–46.0)
Hemoglobin: 7.5 g/dL — ABNORMAL LOW (ref 12.0–15.0)
MCH: 28.5 pg (ref 26.0–34.0)
MCHC: 31.4 g/dL (ref 30.0–36.0)
MCV: 90.9 fL (ref 80.0–100.0)
Platelets: 208 10*3/uL (ref 150–400)
RBC: 2.63 MIL/uL — ABNORMAL LOW (ref 3.87–5.11)
RDW: 19.4 % — ABNORMAL HIGH (ref 11.5–15.5)
WBC: 12.9 10*3/uL — ABNORMAL HIGH (ref 4.0–10.5)
nRBC: 1.2 % — ABNORMAL HIGH (ref 0.0–0.2)

## 2021-07-31 LAB — MAGNESIUM: Magnesium: 2.4 mg/dL (ref 1.7–2.4)

## 2021-07-31 MED ORDER — FUROSEMIDE 10 MG/ML IJ SOLN
40.0000 mg | Freq: Once | INTRAMUSCULAR | Status: AC
  Administered 2021-07-31: 40 mg via INTRAVENOUS
  Filled 2021-07-31: qty 4

## 2021-07-31 MED ORDER — IBUPROFEN 400 MG PO TABS
400.0000 mg | ORAL_TABLET | Freq: Four times a day (QID) | ORAL | Status: DC | PRN
  Administered 2021-07-31 – 2021-08-01 (×2): 400 mg via ORAL
  Filled 2021-07-31 (×2): qty 1

## 2021-07-31 MED ORDER — WARFARIN SODIUM 5 MG PO TABS
5.0000 mg | ORAL_TABLET | Freq: Once | ORAL | Status: AC
  Administered 2021-07-31: 17:00:00 5 mg via ORAL
  Filled 2021-07-31 (×2): qty 1

## 2021-07-31 MED ORDER — GABAPENTIN 600 MG PO TABS
600.0000 mg | ORAL_TABLET | Freq: Three times a day (TID) | ORAL | Status: DC
  Administered 2021-07-31 – 2021-08-01 (×3): 600 mg via ORAL
  Filled 2021-07-31 (×3): qty 1

## 2021-07-31 MED ORDER — COLLAGENASE 250 UNIT/GM EX OINT
TOPICAL_OINTMENT | Freq: Every day | CUTANEOUS | Status: AC
Start: 2021-07-31 — End: 2021-08-14
  Administered 2021-08-06: 1 via TOPICAL
  Filled 2021-07-31 (×4): qty 30

## 2021-07-31 MED ORDER — PROSOURCE PLUS PO LIQD
30.0000 mL | Freq: Two times a day (BID) | ORAL | Status: DC
  Administered 2021-08-01 – 2021-08-16 (×26): 30 mL via ORAL
  Filled 2021-07-31 (×32): qty 30

## 2021-07-31 MED ORDER — ADULT MULTIVITAMIN W/MINERALS CH
1.0000 | ORAL_TABLET | Freq: Every day | ORAL | Status: DC
  Administered 2021-08-01 – 2021-08-16 (×16): 1 via ORAL
  Filled 2021-07-31 (×16): qty 1

## 2021-07-31 MED ORDER — ENSURE ENLIVE PO LIQD
237.0000 mL | Freq: Three times a day (TID) | ORAL | Status: DC
  Administered 2021-08-02 – 2021-08-16 (×31): 237 mL via ORAL

## 2021-07-31 NOTE — Progress Notes (Signed)
Nutrition Follow-up  DOCUMENTATION CODES:   Obesity unspecified  INTERVENTION:   -MVI with minerals daily -Ensure Enlive po TID, each supplement provides 350 kcal and 20 grams of protein  -Liberalize diet to regular -Feeding assistance with meals -30 ml Prosource Plus BID, each supplement provides 100 kcals and 15 grams protein  NUTRITION DIAGNOSIS:   Increased nutrient needs related to wound healing as evidenced by estimated needs.  GOAL:   Patient will meet greater than or equal to 90% of their needs  MONITOR:   PO intake, Supplement acceptance, Labs, Weight trends, Skin, I & O's  REASON FOR ASSESSMENT:   Low [place]    ASSESSMENT:   Rhonda Saunders is a 88 y.o. female with medical history significant for HTN, CAD, diastolic CHF, aortic valve replacement on Coumadin, asthma, DM, dementia, chronic pain, ischial decubitus ulcers, chronic venous stasis ulcers on Unna boots, who was brought to the ED with a complaint of altered mental status, weakness, foul odor from wounds on buttock and concern for possible UTI.  Pt admitted with sepsis and multiple wounds.   Reviewed I/O's: -210 ml x 24 hours and -1.4 L since admission  UOP: 450 ml x 24 hours   Spoke with pt at bedside, who continued to fall asleep during interview. Pt stated she ate oatmeal and eggs for breakfast. PTA she reports good appetite, consuming 3 meals per day which son and granddaughter prepare.   Pt currently on a heart healthy/ carb modified diet. Noted meal completion 0-50%. Pt with well controlled blood sugars on on no DM medications here or PTA. RD will liberalize to regular diet to expand variety of food choices and promote improved oral intake.   Reviewed wt hx; pt has experienced a 11% wt loss over the past 6 months, which is significant for time frame.   Medications reviewed and include miralax  Palliative care following for goals of care discussiona.   Lab Results  Component Value Date   HGBA1C  5.8 (H) 07/26/2021   PTA DM medications are none.   Labs reviewed: CBGS: 87-111 (inpatient orders for glycemic control are none).    NUTRITION - FOCUSED PHYSICAL EXAM:  Flowsheet Row Most Recent Value  Orbital Region No depletion  Upper Arm Region No depletion  Thoracic and Lumbar Region No depletion  Buccal Region No depletion  Temple Region No depletion  Clavicle Bone Region No depletion  Clavicle and Acromion Bone Region No depletion  Scapular Bone Region No depletion  Dorsal Hand No depletion  Patellar Region No depletion  Anterior Thigh Region No depletion  Posterior Calf Region No depletion  Edema (RD Assessment) Moderate  Hair Reviewed  Eyes Reviewed  Mouth Reviewed  Skin Reviewed  Nails Reviewed       Diet Order:   Diet Order             Diet heart healthy/carb modified Room service appropriate? Yes; Fluid consistency: Thin  Diet effective now                   EDUCATION NEEDS:   Education needs have been addressed  Skin:  Skin Assessment: Skin Integrity Issues: Skin Integrity Issues:: DTI, Stage III, Other (Comment) DTI: buttocks Stage III: rt heel Other: lt hand skin tear, non-pressure wound to rt pretibial  Last BM:  07/29/21  Height:   Ht Readings from Last 1 Encounters:  07/10/21 5\' 2"  (1.575 m)    Weight:   Wt Readings from Last 1 Encounters:  07/26/21 83.9 kg    Ideal Body Weight:  50 kg  BMI:  Body mass index is 33.83 kg/m.  Estimated Nutritional Needs:   Kcal:  5525-8948  Protein:  85-100 grams  Fluid:  > 1.7 L    Loistine Chance, RD, LDN, [place] Registered Dietitian II Certified Diabetes Care and Education Specialist Please refer to [facility] for RD and/or RD on-call/weekend/after hours pager

## 2021-07-31 NOTE — Progress Notes (Signed)
OT Cancellation Note  Patient Details Name: Rhonda Saunders MRN: [id number] DOB: 04-Dec-1931   Cancelled Treatment:    Reason Eval/Treat Not Completed: Medical issues which prohibited therapy. Based on chart review, discussion with pt's nurse, presence of open wounds with increased pain and bleeding with movement, as well as pending decisions re: surgery or palliative care, will DC OT services at this time. Please re-consult as necessary as pt's course of treatment becomes clearer and if rehab services are indicated.   Josiah Lobo, PhD, MS, OTR/L 07/31/21, 1:35 PM

## 2021-07-31 NOTE — Consult Note (Signed)
Consultation Note Date: 07/31/2021   Patient Name: Rhonda Saunders  DOB: 12-08-1931  MRN: [id number]  Age / Sex: 88 y.o., female  PCP: Adin Hector, MD Referring Physician: Enzo Bi, MD  Reason for Consultation: Establishing goals of care  HPI/Patient Profile: 88 y.o. female  with past medical history of HTN, CAD, diastolic CHF, AVR (Coumadin), asthma, diabetes type 2, dementia (Aricept), chronic pain, decubitus ulcers, chronic venous stasis ulcers (Unna boots), functional quadriplegia, COPD, CKD (stage IIIa), and pituitary adenoma admitted on 07/26/2021 with AMS, weakness, and foul odor from buttocks with suspected UTI.  General surgery was consulted.  Palliative medicine was consulted to discuss goals of care.   Clinical Assessment and Goals of Care: I have reviewed medical records including EPIC notes, labs and imaging, assessed the patient and then spoke with patient's son Dellis Filbert and his wife Roland Rack over the telephone to discuss diagnosis prognosis, [place], EOL wishes, disposition and options.  I introduced Palliative Medicine as specialized medical care for people living with serious illness. It focuses on providing relief from the symptoms and stress of a serious illness. The goal is to improve quality of life for both the patient and the family.  As far as functional and nutritional status patient was bedbound prior to admission.  We discussed patient's current illness and what it means in the larger context of patient's on-going co-morbidities. I attempted to elicit values and goals of care important to the patient.  Son shared what he would want for the patient but is unclear on what the patient's wishes would be.  He recalls a time when she was completely alert and oriented and she shared that she would like to be kept alive.  However, he does not know more details regarding her advanced care  wishes.  The difference between aggressive medical intervention and comfort care was considered in light of the patient's goals of care.  Pros and cons and risks and benefits of moving forward with surgery and moving forward with keeping patient comfortable reviewed in detail.  Education offered regarding concept specific to human mortality and the limitations of medical interventions to prolong life in patients with dementia and multiple com-morbidities.  Family is facing treatment option decisions, advanced directive, and anticipatory care needs.  Dellis Filbert plans on visiting with his mother 1 more time today to see if she could weigh in on whether or not to move forward with the surgeries.  He shared that he well notify either myself or the medical team of his decision sometime this afternoon.  Family shares they are not able to care for patient at home and she will need placement either to rehab or a long-term care facility at discharge.   Discussed with patient/family the importance of continued conversation with family and the medical providers regarding overall plan of care and treatment options, ensuring decisions are within the context of the patients values and GOCs.    Palliative Care services outpatient were explained and offered.  Questions and concerns were addressed. The  family was encouraged to call with questions or concerns.   Primary Decision Maker NEXT OF KIN  Code Status/Advance Care Planning: Full code  Prognosis:   Unable to determine  Discharge Planning: To Be Determined  Primary Diagnoses: Present on Admission:  Sepsis ([place])  Chronic pain syndrome  Chronic diastolic CHF (congestive heart failure) (HCC)  Essential hypertension  Senile dementia without behavioral disturbance (HCC)  Lower limb ulcer, ankle, right, with fat layer exposed ([place])  Stage 3a chronic kidney disease ([place])   Physical Exam Vitals and nursing note reviewed.  Constitutional:       Appearance: Normal appearance.  HENT:     Head: Normocephalic and atraumatic.     Mouth/Throat:     Mouth: Mucous membranes are moist.  Eyes:     Pupils: Pupils are equal, round, and reactive to light.  Cardiovascular:     Rate and Rhythm: Normal rate.  Pulmonary:     Effort: Pulmonary effort is normal.  Abdominal:     Palpations: Abdomen is soft.  Musculoskeletal:     Cervical back: Normal range of motion.     Comments: Bedbound, generalized weakness  Skin:    General: Skin is warm.     Comments: Decubitus ulcer, bilateral venous stasis ulcers  Neurological:     Mental Status: She is alert. Mental status is at baseline.     Comments: Alert to self, able to make her needs known  Psychiatric:        Mood and Affect: Mood normal.    Vital Signs: BP (!) 150/62 (BP Location: Right Arm)    Pulse 76    Temp (!) 97.5 F (36.4 C) (Oral)    Resp 19    Wt 83.9 kg    SpO2 98%    BMI 33.83 kg/m  Pain Scale: 0-10   Pain Score: 3  SpO2: SpO2: 98 % O2 Device:SpO2: 98 % O2 Flow Rate: .   Palliative Assessment/Data: 30%     I discussed this patient's plan of care with Dr. Billie Ruddy, nursing, patient, patient's son and his wife.  Thank you for this consult. Palliative medicine will continue to follow and assist holistically.   Time Total: 70 minutes Greater than 50%  of this time was spent counseling and coordinating care related to the above assessment and plan.  Signed by: Jordan Hawks, DNP, FNP-BC Palliative Medicine    Please contact Palliative Medicine Team phone at [phone] for questions and concerns.  For individual provider: See Shea Fullman

## 2021-07-31 NOTE — Progress Notes (Signed)
PROGRESS NOTE    Rhonda Saunders  WGN:[id number] DOB: July 29, 1931 DOA: 07/26/2021 PCP: Adin Hector, MD  125A/125A-AA   Assessment & Plan:   Principal Problem:   Sepsis [facility]) Active Problems:   Coronary artery disease   Chronic diastolic CHF (congestive heart failure) ([place])   Essential hypertension   Hx of aortic valve replacement, mechanical   Anticoagulated on warfarin   Senile dementia without behavioral disturbance ([place])   Type 2 diabetes, diet controlled (HCC)   Chronic pain syndrome   Lower limb ulcer, ankle, right, with fat layer exposed ([place])   Decubitus ulcer, infected, unspecified pressure ulcer stage   Cellulitis   COPD (chronic obstructive pulmonary disease) ([place])   Stage 3a chronic kidney disease ([place])   Supratherapeutic INR   Acute metabolic encephalopathy   Functional quadriplegia (HCC)   Rhonda Saunders is a 88 y.o. female with medical history significant for HTN, CAD, diastolic CHF, aortic valve replacement on Coumadin, asthma, DM, dementia, chronic pain, ischial decubitus ulcers, chronic venous stasis ulcers on Unna boots, who was brought to the ED with a complaint of altered mental status, weakness, foul odor from wounds on buttock and concern for possible UTI.   Sepsis, ruled out --does not meet criteria    Decubitus ulcer buttock unspecified pressure ulcer stage   Cellulitis buttock - Elevated WBC with lactic acid 2.6-2.6 without fever or tachycardia - received vanc, cefepime on admission --GenSurg consulted Plan: --cont Vanc and ceftriaxone, day 6 of 7 --wound care consult and dressing orders  Right heel unstageble pressure ulcer Right lower leg venous stasis unstageble ulcer --GenSurg rec amputation Plan: --family is still undecided about surgery --wound care and dressing change per orders  AMS and lethargy --possibly due to too much scheduled opioids and gabapentin --cont home oxycontin 10 mg as BID PRN --avoid additional opioids     Hx  of aortic valve replacement, mechanical   Supratherapeutic INR, anticoagulated on warfarin --INR 3.6 on presentation, went up to 5.9 morning of 1/6 without receiving warfarin, finally back down to 3.3 today.  Did not use reversal agent. --cont warfarin, per pharm dosing  Acute on chronic anemia from blood loss --Hgb 11.6 on presentation, gradually dropping to 7.5 this morning, likely from blood draws, illness and reduced re-generation, and bleeding from skin tears. --monitor Hgb and transfuse to keep Hgb >7  Skin tear over left hand and bleeding --exacerbated by swelling and high INR --Applied surgi-gel and compression dressing with ACE today, keep at least for 3 days. --IV lasix 40 mg to reduce swelling (while monitoring Cr)  Functional quadriplegia Obesity - PT and OT consult     Coronary artery disease - Appears stable, without chest pain and EKG nonacute --cont home statin     Chronic diastolic CHF (congestive heart failure) (HCC) - Appears euvolemic     Essential hypertension - cont home Toprol     Senile dementia without behavioral disturbance (HCC) - Continue Aricept - Delirium precautions - Haldol as needed     Type 2 diabetes, diet controlled (HCC) - Hemoglobin A1c 5.8 --d/c'ed BG checks and SSI     Chronic pain syndrome/neuropathic pain --increase home gabapentin today --cont home oxycontin 10 mg as BID PRN --avoid adding extra opioids due to lethargy      COPD (chronic obstructive pulmonary disease) (HCC) - Not acutely exacerbated - Ventolin as needed     Stage 3a chronic kidney disease (HCC)   DVT prophylaxis: HQ:IONGEXBM Code Status: Full code  palliative care consulted  Family Communication:   Level of care: Med-Surg Dispo:   The patient is from: home Anticipated d/c is to: SNF Anticipated d/c date is: undetermined Patient currently is not medically ready to d/c due to: possible surgical I/D, waiting on family to make a decision after palliative  consult   Subjective and Interval History:  Pt continued to have blood oozing from the skin tear on her left hand, with INR at goal now.  Applied surgi-gel and compression dressing with ACE.  Palliative care consult today.   Objective: Vitals:   07/31/21 0527 07/31/21 0807 07/31/21 0816 07/31/21 1213  BP: 136/62 (!) 150/62  134/61  Pulse: 81 (!) 54 76 88  Resp: 17 19  19   Temp: 98.1 F (36.7 C) (!) 97.5 F (36.4 C)  (!) 97.4 F (36.3 C)  TempSrc: Oral Oral  Oral  SpO2: 98% 98%  100%  Weight:        Intake/Output Summary (Last 24 hours) at 07/31/2021 1554 Last data filed at 07/31/2021 0525 Gross per 24 hour  Intake --  Output 250 ml  Net -250 ml   Filed Weights   07/26/21 0301  Weight: 83.9 kg    Examination:   Constitutional: NAD, sleepy but arousable HEENT: conjunctivae and lids normal, EOMI CV: No cyanosis.   RESP: normal respiratory effort, on RA SKIN: 2 superficial skin tears over left hand with blood oozing.     Photos taken on 07/29/21   Right heel       Data Reviewed: I have personally reviewed following labs and imaging studies  CBC: Recent Labs  Lab 07/25/21 1426 07/26/21 0807 07/27/21 0425 07/28/21 0357 07/29/21 0519 07/30/21 0539 07/31/21 0523  WBC 13.8*   < > 12.8* 10.4 10.7* 10.9* 12.9*  NEUTROABS 10.7*  --   --   --   --   --   --   HGB 11.6*   < > 9.6* 9.2* 8.6* 8.5* 7.5*  HCT 37.4   < > 30.4* 28.5* 27.3* 27.1* 23.9*  MCV 94.7   < > 92.7 91.9 91.3 91.9 90.9  PLT 195   < > 178 195 181 189 208   < > = values in this interval not displayed.   Basic Metabolic Panel: Recent Labs  Lab 07/27/21 0425 07/28/21 0357 07/29/21 0519 07/30/21 0539 07/31/21 0523  NA 140 140 138 137 136  K 3.6 3.1* 3.6 3.5 4.1  CL 110 108 109 109 107  CO2 27 24 24 24 25   GLUCOSE 92 98 99 112* 108*  BUN 29* 24* 18 14 13   CREATININE 1.06* 0.97 0.85 0.75 0.75  CALCIUM 7.9* 8.0* 7.8* 7.6* 7.6*  MG 2.1 2.1 2.2 2.2 2.4   GFR: Estimated Creatinine  Clearance: 49.7 mL/min (by C-G formula based on SCr of 0.75 mg/dL). Liver Function Tests: Recent Labs  Lab 07/25/21 1426  AST 50*  ALT 23  ALKPHOS 71  BILITOT 0.9  PROT 8.5*  ALBUMIN 2.6*   No results for input(s): LIPASE, AMYLASE in the last 168 hours. No results for input(s): AMMONIA in the last 168 hours. Coagulation Profile: Recent Labs  Lab 07/27/21 0425 07/28/21 0357 07/29/21 0519 07/30/21 0539 07/31/21 0523  INR 5.9* 5.5* 5.9* 4.9* 3.3*   Cardiac Enzymes: No results for input(s): CKTOTAL, CKMB, CKMBINDEX, TROPONINI in the last 168 hours. BNP (last 3 results) No results for input(s): PROBNP in the last 8760 hours. HbA1C: No results for input(s): HGBA1C in the last 72  hours.  CBG: Recent Labs  Lab 07/26/21 2107 07/27/21 0841 07/27/21 1215 07/27/21 1716 07/27/21 2059  GLUCAP 104* 87 90 111* 106*   Lipid Profile: No results for input(s): CHOL, HDL, LDLCALC, TRIG, CHOLHDL, LDLDIRECT in the last 72 hours. Thyroid Function Tests: No results for input(s): TSH, T4TOTAL, FREET4, T3FREE, THYROIDAB in the last 72 hours. Anemia Panel: No results for input(s): VITAMINB12, FOLATE, FERRITIN, TIBC, IRON, RETICCTPCT in the last 72 hours. Sepsis Labs: Recent Labs  Lab 07/25/21 1425 07/25/21 1605 07/26/21 0807  PROCALCITON 0.12  --   --   LATICACIDVEN 2.6* 2.6* 1.7    Recent Results (from the past 240 hour(s))  Culture, blood (Routine x 2)     Status: None   Collection Time: 07/25/21  2:25 PM   Specimen: BLOOD  Result Value Ref Range Status   Specimen Description BLOOD RIGHT ANTECUBITAL  Final   Special Requests   Final    BOTTLES DRAWN AEROBIC AND ANAEROBIC Blood Culture adequate volume   Culture   Final    NO GROWTH 5 DAYS Performed at [facility], [address] [place], [place] [address]    Report Status 07/30/2021 FINAL  Final  Culture, blood (Routine x 2)     Status: None   Collection Time: 07/25/21  4:22 PM   Specimen: BLOOD  Result Value  Ref Range Status   Specimen Description BLOOD BLOOD RIGHT WRIST  Final   Special Requests   Final    BOTTLES DRAWN AEROBIC AND ANAEROBIC Blood Culture results may not be optimal due to an inadequate volume of blood received in culture bottles   Culture   Final    NO GROWTH 5 DAYS Performed at [facility], [address] [place], [place] [address]    Report Status 07/30/2021 FINAL  Final  Resp Panel by RT-PCR (Flu A&B, Covid) Nasopharyngeal Swab     Status: None   Collection Time: 07/26/21  1:44 AM   Specimen: Nasopharyngeal Swab; Nasopharyngeal(NP) swabs in vial transport medium  Result Value Ref Range Status   SARS Coronavirus 2 by RT PCR NEGATIVE NEGATIVE Final    Comment: (NOTE) SARS-CoV-2 target nucleic acids are NOT DETECTED.  The SARS-CoV-2 RNA is generally detectable in upper respiratory specimens during the acute phase of infection. The lowest concentration of SARS-CoV-2 viral copies this assay can detect is 138 copies/mL. A negative result does not preclude SARS-Cov-2 infection and should not be used as the sole basis for treatment or other patient management decisions. A negative result may occur with  improper specimen collection/handling, submission of specimen other than nasopharyngeal swab, presence of viral mutation(s) within the areas targeted by this assay, and inadequate number of viral copies(<138 copies/mL). A negative result must be combined with clinical observations, patient history, and epidemiological information. The expected result is Negative.  Fact Sheet for Patients:  EntrepreneurPulse.com.au  Fact Sheet for Healthcare Providers:  IncredibleEmployment.be  This test is no t yet approved or cleared by the [place] FDA and  has been authorized for detection and/or diagnosis of SARS-CoV-2 by FDA under an Emergency Use Authorization (EUA). This EUA will remain  in effect (meaning this test can be used)  for the duration of the COVID-19 declaration under Section 564(b)(1) of the Act, 21 U.S.C.section 360bbb-3(b)(1), unless the authorization is terminated  or revoked sooner.       Influenza A by PCR NEGATIVE NEGATIVE Final   Influenza B by PCR NEGATIVE NEGATIVE Final    Comment: (  NOTE) The Xpert Xpress SARS-CoV-2/FLU/RSV plus assay is intended as an aid in the diagnosis of influenza from Nasopharyngeal swab specimens and should not be used as a sole basis for treatment. Nasal washings and aspirates are unacceptable for Xpert Xpress SARS-CoV-2/FLU/RSV testing.  Fact Sheet for Patients: EntrepreneurPulse.com.au  Fact Sheet for Healthcare Providers: IncredibleEmployment.be  This test is not yet approved or cleared by the [place] FDA and has been authorized for detection and/or diagnosis of SARS-CoV-2 by FDA under an Emergency Use Authorization (EUA). This EUA will remain in effect (meaning this test can be used) for the duration of the COVID-19 declaration under Section 564(b)(1) of the Act, 21 U.S.C. section 360bbb-3(b)(1), unless the authorization is terminated or revoked.  Performed at [facility], [address] [place], [place] [address]   Urine Culture     Status: Abnormal   Collection Time: 07/26/21  5:12 AM   Specimen: Urine, Clean Catch  Result Value Ref Range Status   Specimen Description   Final    URINE, CLEAN CATCH Performed at [facility], [address], [place], [place] [address]    Special Requests   Final    NONE Performed at [facility], [address] [place],  [address]    Culture MULTIPLE SPECIES PRESENT, SUGGEST RECOLLECTION (A)  Final   Report Status 07/27/2021 FINAL  Final      Radiology Studies: No results found.   Scheduled Meds:  [START ON 08/01/2021] (feeding supplement) PROSource Plus  30 mL Oral BID BM   atorvastatin  80 mg Oral Daily   collagenase    Topical Daily   donepezil  5 mg Oral QHS   feeding supplement  237 mL Oral TID BM   fluticasone furoate-vilanterol  1 puff Inhalation Daily   gabapentin  600 mg Oral BID   levothyroxine  25 mcg Oral Q0600   metoprolol succinate  12.5 mg Oral Daily   [START ON 08/01/2021] multivitamin with minerals  1 tablet Oral Daily   polyethylene glycol  17 g Oral Daily   warfarin  5 mg Oral ONCE-1600   Warfarin - Pharmacist Dosing Inpatient   Does not apply q1600   Continuous Infusions:  cefTRIAXone (ROCEPHIN)  IV 1 g (07/30/21 2020)     LOS: 5 days     Enzo Bi, MD [facility] If 7PM-7AM, please contact night-coverage 07/31/2021, 3:54 PM

## 2021-07-31 NOTE — Progress Notes (Signed)
ANTICOAGULATION CONSULT NOTE  Pharmacy Consult for warfarin Indication:  mechanical valve  Patient Measurements: Weight: 83.9 kg (184 lb 15.5 oz)  Vital Signs: Temp: 97.4 F (36.3 C) (01/10 1213) Temp Source: Oral (01/10 1213) BP: 134/61 (01/10 1213) Pulse Rate: 88 (01/10 1213)  Labs: Recent Labs    07/29/21 0519 07/30/21 0539 07/31/21 0523  HGB 8.6* 8.5* 7.5*  HCT 27.3* 27.1* 23.9*  PLT 181 189 208  LABPROT 52.8* 45.8* 33.5*  INR 5.9* 4.9* 3.3*  CREATININE 0.85 0.75 0.75     Estimated Creatinine Clearance: 49.7 mL/min (by C-G formula based on SCr of 0.75 mg/dL).   Medical History: Past Medical History:  Diagnosis Date   CHF (congestive heart failure) (HCC)    COPD (chronic obstructive pulmonary disease) (HCC)    Coronary artery disease    Dementia (HCC)    Diabetes mellitus without complication ([place])    Hyperlipidemia    Hypertension       Assessment: 88 year old female with altered mental status. Patient presented with INR 3.6 that has since trended up. Patient on warfarin for mechanical valve. Per review of Care Everywhere, it looks like she has had several supratherapeutic INRs in the past month.  home warfarin regimen: 5 mg daily  Date INR Warfarin Dose  1/6 5.9 HELD  1/7 5.5 HELD  1/8 5.9 HELD  1/9 4.9 HOLD  1/10 3.3 5 mg   DDI: ceftriaxone  Goal of Therapy:  INR 2.5-3.5 Monitor platelets by anticoagulation protocol: Yes   Plan:  INR therapeutic. Will reorder home dose of 5mg  for this evening. Hgb continues to trend down. MD aware. Will order potential transfusion if drops below 7. Daily INR already ordered. CBC at least every 3 days.    Pearla Dubonnet, PharmD Clinical Pharmacist 07/31/2021 12:18 PM

## 2021-07-31 NOTE — Consult Note (Signed)
[place] Nurse wound consult note Consultation was completed by review of records, images and assistance from the bedside nurse/clinical staff.  Reason for Consult: bleeding skin tear Seen by my partner 07/27/21 for same apparently in the presence of anticoagulation the area is bleeding more Wound type: skin tear  Pressure Injury POA: Yes; see previous notes extensive pressure injuries and LE ischemia  Measurement: see nursing flow sheet Wound bed: see nursing flow sheet  Dressing procedure/placement/frequency: will add small piece of calcium alginate over skin tear for its hemostatic properties. Continue vaseline gauze and foam over this.    Re consult if needed, will not follow at this time. Thanks  Caroline Longie [employer], RN,CWOCN, CNS, [place] [phone])

## 2021-08-01 DIAGNOSIS — Z7901 Long term (current) use of anticoagulants: Secondary | ICD-10-CM | POA: Diagnosis not present

## 2021-08-01 DIAGNOSIS — F0390 Unspecified dementia without behavioral disturbance: Secondary | ICD-10-CM

## 2021-08-01 DIAGNOSIS — G9341 Metabolic encephalopathy: Secondary | ICD-10-CM | POA: Diagnosis not present

## 2021-08-01 DIAGNOSIS — A419 Sepsis, unspecified organism: Secondary | ICD-10-CM | POA: Diagnosis not present

## 2021-08-01 DIAGNOSIS — L03317 Cellulitis of buttock: Secondary | ICD-10-CM | POA: Diagnosis not present

## 2021-08-01 LAB — BASIC METABOLIC PANEL
Anion gap: 3 — ABNORMAL LOW (ref 5–15)
BUN: 16 mg/dL (ref 8–23)
CO2: 27 mmol/L (ref 22–32)
Calcium: 7.3 mg/dL — ABNORMAL LOW (ref 8.9–10.3)
Chloride: 106 mmol/L (ref 98–111)
Creatinine, Ser: 1.01 mg/dL — ABNORMAL HIGH (ref 0.44–1.00)
GFR, Estimated: 54 mL/min — ABNORMAL LOW (ref >60)
Glucose, Bld: 106 mg/dL — ABNORMAL HIGH (ref 70–99)
Potassium: 3.8 mmol/L (ref 3.5–5.1)
Sodium: 136 mmol/L (ref 135–145)

## 2021-08-01 LAB — PROTIME-INR
INR: 2.4 — ABNORMAL HIGH (ref 0.8–1.2)
Prothrombin Time: 26.4 seconds — ABNORMAL HIGH (ref 11.4–15.2)

## 2021-08-01 LAB — CBC
HCT: 21.3 % — ABNORMAL LOW (ref 36.0–46.0)
Hemoglobin: 6.8 g/dL — ABNORMAL LOW (ref 12.0–15.0)
MCH: 29.2 pg (ref 26.0–34.0)
MCHC: 31.9 g/dL (ref 30.0–36.0)
MCV: 91.4 fL (ref 80.0–100.0)
Platelets: 186 10*3/uL (ref 150–400)
RBC: 2.33 MIL/uL — ABNORMAL LOW (ref 3.87–5.11)
RDW: 19.7 % — ABNORMAL HIGH (ref 11.5–15.5)
WBC: 11.4 10*3/uL — ABNORMAL HIGH (ref 4.0–10.5)
nRBC: 1.6 % — ABNORMAL HIGH (ref 0.0–0.2)

## 2021-08-01 LAB — PREPARE RBC (CROSSMATCH)

## 2021-08-01 LAB — MAGNESIUM: Magnesium: 2.3 mg/dL (ref 1.7–2.4)

## 2021-08-01 MED ORDER — WARFARIN SODIUM 6 MG PO TABS
6.0000 mg | ORAL_TABLET | Freq: Once | ORAL | Status: AC
  Administered 2021-08-01: 16:00:00 6 mg via ORAL
  Filled 2021-08-01: qty 1

## 2021-08-01 MED ORDER — GABAPENTIN 600 MG PO TABS
600.0000 mg | ORAL_TABLET | Freq: Two times a day (BID) | ORAL | Status: DC
  Administered 2021-08-01 – 2021-08-16 (×30): 600 mg via ORAL
  Filled 2021-08-01 (×30): qty 1

## 2021-08-01 MED ORDER — SODIUM CHLORIDE 0.9% IV SOLUTION: Freq: Once | INTRAVENOUS | Status: AC

## 2021-08-01 NOTE — Progress Notes (Addendum)
PROGRESS NOTE    Rhonda Saunders  TKP:[id number] DOB: [birth year] DOA: 07/26/2021 PCP: Adin Hector, MD   Brief Narrative: 88 year old with past medical history significant for hypertension, CAD, diastolic heart failure, aortic valve replacement on Coumadin, asthma, diabetes, dementia, chronic pain, ischial decubitus ulcer, chronic venous stasis ulcer lower extremity on Unna boot, who presents to the ED complaining of weakness, full odor from wounds, concern for UTI.  Patient was also noted to be having altered mental status.    Assessment & Plan:   Principal Problem:   Sepsis ([place]) Active Problems:   Coronary artery disease   Chronic diastolic CHF (congestive heart failure) (HCC)   Essential hypertension   Hx of aortic valve replacement, mechanical   Anticoagulated on warfarin   Senile dementia without behavioral disturbance (HCC)   Type 2 diabetes, diet controlled (HCC)   Chronic pain syndrome   Lower limb ulcer, ankle, right, with fat layer exposed ([place])   Decubitus ulcer, infected, unspecified pressure ulcer stage   Cellulitis   COPD (chronic obstructive pulmonary disease) (HCC)   Stage 3a chronic kidney disease (HCC)   Supratherapeutic INR   Acute metabolic encephalopathy   Functional quadriplegia (HCC)   1-Decubitus ulcer buttock, unspecified pressure ulcer stage, cellulitis of buttock: -Patient presented with leukocytosis and lactic acidosis, no fevers or tachycardia. -Continue with IV vancomycin and cefepime -Wound care consulted. surgery consulted.  2-Right heel unstageable pressure ulcer, Right lower leg venous stasis unstageable ulcer General surgery recommend amputation. Palliative care consulted for goals of care, family thinking about their decisions If family agree with amputation will need to involve vascular.   Acute metabolic encephalopathy: Patient presented with altered mental status and lethargy. Probably related to medications, opioid and  gabapentin OxyContin reduced to 10 mg twice daily as needed Gabapentin reduced to home dose  History of aortic valve replacement, mechanical Supra therapeutic INR Coumadin per pharmacy INR;   Acute on chronic anemia, from blood loss Related to multiple blood drawn, bleeding from skin tears Hb down to 6.9.  She will receive 1 unit of packed red blood cell  Skin tear of her left hand bleeding Exacerbated by swelling and supratherapeutic INR Applied Surgicel and compression dressing with Ace.  Functional Quadriplegia, obesity: PT OT consulted Chronic diastolic heart failure: Appears euvolemic CAD: Continue with statin Hypertension: Continue with Toprol Senile dementia without behavioral disturbance continue with Aricept Diabetes type 2: A1c 5.8. Chronic Pain syndrome neuropathic pain Continue OxyContin 10 mg twice daily as needed. COPD: Continue with albuterol as needed Stage IIIa CKD: Monitor renal function Sepsis was ruled out.  Pressure Injury Stage II -  Partial thickness loss of dermis presenting as a shallow open ulcer with a red, pink wound bed without slough. Patient have small stage 2 (Active)     Location: Buttocks  Location Orientation: Left  Staging: Stage II -  Partial thickness loss of dermis presenting as a shallow open ulcer with a red, pink wound bed without slough.  Wound Description (Comments): Patient have small stage 2  Present on Admission: Yes     Pressure Injury 07/27/21 Heel Right Stage 3 -  Full thickness tissue loss. Subcutaneous fat may be visible but bone, tendon or muscle are NOT exposed. (Active)  07/27/21 0612  Location: Heel  Location Orientation: Right  Staging: Stage 3 -  Full thickness tissue loss. Subcutaneous fat may be visible but bone, tendon or muscle are NOT exposed.  Wound Description (Comments):   Present on Admission:  Pressure Injury 07/27/21 Buttocks Bilateral Deep Tissue Pressure Injury - Purple or maroon localized area of  discolored intact skin or blood-filled blister due to damage of underlying soft tissue from pressure and/or shear. (Active)  07/27/21 0614  Location: Buttocks  Location Orientation: Bilateral  Staging: Deep Tissue Pressure Injury - Purple or maroon localized area of discolored intact skin or blood-filled blister due to damage of underlying soft tissue from pressure and/or shear.  Wound Description (Comments):   Present on Admission:      Nutrition Problem: Increased nutrient needs Etiology: wound healing    Signs/Symptoms: estimated needs    Interventions: MVI, Juven, Premier Protein  Estimated body mass index is 33.83 kg/m as calculated from the following:   Height as of 07/10/21: 5\' 2"  (1.575 m).   Weight as of this encounter: 83.9 kg.   DVT prophylaxis: Coumadin  Code Status: Full code Family Communication: no family at bdeside Disposition Plan:  Status is: Inpatient  Remains inpatient appropriate because: anemia, multiples wound        Consultants:  General surgery Palliative   Procedures:    Antimicrobials:    Subjective: Patient is sleepy, she currently denies pain,  Objective: Vitals:   08/01/21 1037 08/01/21 1104 08/01/21 1108 08/01/21 1225  BP: (!) 130/49 (!) 139/54 (!) 139/54 (!) 135/54  Pulse: 73 75 75 71  Resp: 18 18 18 18   Temp: 98 F (36.7 C) (!) 97.5 F (36.4 C) (!) 97.5 F (36.4 C) 98.4 F (36.9 C)  TempSrc: Oral  Oral Oral  SpO2: 98%  99% 99%  Weight:        Intake/Output Summary (Last 24 hours) at 08/01/2021 1431 Last data filed at 08/01/2021 1300 Gross per 24 hour  Intake 120 ml  Output 200 ml  Net -80 ml   Filed Weights   07/26/21 0301  Weight: 83.9 kg    Examination:  General exam: Chronically ill appearing weak Respiratory system: Clear to auscultation. Respiratory effort normal. Cardiovascular system: S1 & S2 heard, RRR. No JVD, murmurs, rubs, gallops or clicks. No pedal edema. Gastrointestinal system: Abdomen  is nondistended, soft and nontender. No organomegaly or masses felt. Normal bowel sounds heard. Central nervous system: Alert and oriented. N Extremities: large right heel ulcers and necrotic tissue. Right LE calf big open ulcer wound    Data Reviewed: I have personally reviewed following labs and imaging studies  CBC: Recent Labs  Lab 07/28/21 0357 07/29/21 0519 07/30/21 0539 07/31/21 0523 08/01/21 0533  WBC 10.4 10.7* 10.9* 12.9* 11.4*  HGB 9.2* 8.6* 8.5* 7.5* 6.8*  HCT 28.5* 27.3* 27.1* 23.9* 21.3*  MCV 91.9 91.3 91.9 90.9 91.4  PLT 195 181 189 208 301   Basic Metabolic Panel: Recent Labs  Lab 07/28/21 0357 07/29/21 0519 07/30/21 0539 07/31/21 0523 08/01/21 0533  NA 140 138 137 136 136  K 3.1* 3.6 3.5 4.1 3.8  CL 108 109 109 107 106  CO2 24 24 24 25 27   GLUCOSE 98 99 112* 108* 106*  BUN 24* 18 14 13 16   CREATININE 0.97 0.85 0.75 0.75 1.01*  CALCIUM 8.0* 7.8* 7.6* 7.6* 7.3*  MG 2.1 2.2 2.2 2.4 2.3   GFR: Estimated Creatinine Clearance: 39.4 mL/min (A) (by C-G formula based on SCr of 1.01 mg/dL (H)). Liver Function Tests: No results for input(s): AST, ALT, ALKPHOS, BILITOT, PROT, ALBUMIN in the last 168 hours. No results for input(s): LIPASE, AMYLASE in the last 168 hours. No results for input(s): AMMONIA in the last  168 hours. Coagulation Profile: Recent Labs  Lab 07/28/21 0357 07/29/21 0519 07/30/21 0539 07/31/21 0523 08/01/21 0533  INR 5.5* 5.9* 4.9* 3.3* 2.4*   Cardiac Enzymes: No results for input(s): CKTOTAL, CKMB, CKMBINDEX, TROPONINI in the last 168 hours. BNP (last 3 results) No results for input(s): PROBNP in the last 8760 hours. HbA1C: No results for input(s): HGBA1C in the last 72 hours. CBG: Recent Labs  Lab 07/26/21 2107 07/27/21 0841 07/27/21 1215 07/27/21 1716 07/27/21 2059  GLUCAP 104* 87 90 111* 106*   Lipid Profile: No results for input(s): CHOL, HDL, LDLCALC, TRIG, CHOLHDL, LDLDIRECT in the last 72 hours. Thyroid Function  Tests: No results for input(s): TSH, T4TOTAL, FREET4, T3FREE, THYROIDAB in the last 72 hours. Anemia Panel: No results for input(s): VITAMINB12, FOLATE, FERRITIN, TIBC, IRON, RETICCTPCT in the last 72 hours. Sepsis Labs: Recent Labs  Lab 07/25/21 1605 07/26/21 0807  LATICACIDVEN 2.6* 1.7    Recent Results (from the past 240 hour(s))  Culture, blood (Routine x 2)     Status: None   Collection Time: 07/25/21  2:25 PM   Specimen: BLOOD  Result Value Ref Range Status   Specimen Description BLOOD RIGHT ANTECUBITAL  Final   Special Requests   Final    BOTTLES DRAWN AEROBIC AND ANAEROBIC Blood Culture adequate volume   Culture   Final    NO GROWTH 5 DAYS Performed at [facility], [address] [place], [place] [address]    Report Status 07/30/2021 FINAL  Final  Culture, blood (Routine x 2)     Status: None   Collection Time: 07/25/21  4:22 PM   Specimen: BLOOD  Result Value Ref Range Status   Specimen Description BLOOD BLOOD RIGHT WRIST  Final   Special Requests   Final    BOTTLES DRAWN AEROBIC AND ANAEROBIC Blood Culture results may not be optimal due to an inadequate volume of blood received in culture bottles   Culture   Final    NO GROWTH 5 DAYS Performed at [facility], [address] [place], [place] [address]    Report Status 07/30/2021 FINAL  Final  Resp Panel by RT-PCR (Flu A&B, Covid) Nasopharyngeal Swab     Status: None   Collection Time: 07/26/21  1:44 AM   Specimen: Nasopharyngeal Swab; Nasopharyngeal(NP) swabs in vial transport medium  Result Value Ref Range Status   SARS Coronavirus 2 by RT PCR NEGATIVE NEGATIVE Final    Comment: (NOTE) SARS-CoV-2 target nucleic acids are NOT DETECTED.  The SARS-CoV-2 RNA is generally detectable in upper respiratory specimens during the acute phase of infection. The lowest concentration of SARS-CoV-2 viral copies this assay can detect is 138 copies/mL. A negative result does not preclude  SARS-Cov-2 infection and should not be used as the sole basis for treatment or other patient management decisions. A negative result may occur with  improper specimen collection/handling, submission of specimen other than nasopharyngeal swab, presence of viral mutation(s) within the areas targeted by this assay, and inadequate number of viral copies(<138 copies/mL). A negative result must be combined with clinical observations, patient history, and epidemiological information. The expected result is Negative.  Fact Sheet for Patients:  EntrepreneurPulse.com.au  Fact Sheet for Healthcare Providers:  IncredibleEmployment.be  This test is no t yet approved or cleared by the [place] FDA and  has been authorized for detection and/or diagnosis of SARS-CoV-2 by FDA under an Emergency Use Authorization (EUA). This EUA will remain  in effect (meaning this test can  be used) for the duration of the COVID-19 declaration under Section 564(b)(1) of the Act, 21 U.S.C.section 360bbb-3(b)(1), unless the authorization is terminated  or revoked sooner.       Influenza A by PCR NEGATIVE NEGATIVE Final   Influenza B by PCR NEGATIVE NEGATIVE Final    Comment: (NOTE) The Xpert Xpress SARS-CoV-2/FLU/RSV plus assay is intended as an aid in the diagnosis of influenza from Nasopharyngeal swab specimens and should not be used as a sole basis for treatment. Nasal washings and aspirates are unacceptable for Xpert Xpress SARS-CoV-2/FLU/RSV testing.  Fact Sheet for Patients: EntrepreneurPulse.com.au  Fact Sheet for Healthcare Providers: IncredibleEmployment.be  This test is not yet approved or cleared by the [place] FDA and has been authorized for detection and/or diagnosis of SARS-CoV-2 by FDA under an Emergency Use Authorization (EUA). This EUA will remain in effect (meaning this test can be used) for the duration of  the COVID-19 declaration under Section 564(b)(1) of the Act, 21 U.S.C. section 360bbb-3(b)(1), unless the authorization is terminated or revoked.  Performed at [facility], [address] [place], [place] [address]   Urine Culture     Status: Abnormal   Collection Time: 07/26/21  5:12 AM   Specimen: Urine, Clean Catch  Result Value Ref Range Status   Specimen Description   Final    URINE, CLEAN CATCH Performed at [facility], [address], [place], [place] [address]    Special Requests   Final    NONE Performed at [facility], [address] [place], [place] [address]    Culture MULTIPLE SPECIES PRESENT, SUGGEST RECOLLECTION (A)  Final   Report Status 07/27/2021 FINAL  Final         Radiology Studies: No results found.      Scheduled Meds:  (feeding supplement) PROSource Plus  30 mL Oral BID BM   atorvastatin  80 mg Oral Daily   collagenase   Topical Daily   donepezil  5 mg Oral QHS   feeding supplement  237 mL Oral TID BM   fluticasone furoate-vilanterol  1 puff Inhalation Daily   gabapentin  600 mg Oral TID   levothyroxine  25 mcg Oral Q0600   metoprolol succinate  12.5 mg Oral Daily   multivitamin with minerals  1 tablet Oral Daily   polyethylene glycol  17 g Oral Daily   warfarin  6 mg Oral ONCE-1600   Warfarin - Pharmacist Dosing Inpatient   Does not apply q1600   Continuous Infusions:  cefTRIAXone (ROCEPHIN)  IV 1 g (07/31/21 2053)     LOS: 6 days    Time spent: 35 minutes    Glennis [place] A Chassity Ludke, MD [facility]   If 7PM-7AM, please contact night-coverage [email or web address]  08/01/2021, 2:31 PM

## 2021-08-01 NOTE — Progress Notes (Signed)
Palliative Care Progress Note, Assessment & Plan   Patient Name: Rhonda Saunders       Date: 08/01/2021 DOB: [birth year]  Age: 88 y.o. MRN#: [id number] Attending Physician: Elmarie Shiley, MD Primary Care Physician: Adin Hector, MD Admit Date: 07/26/2021  Reason for Consultation/Follow-up: Establishing goals of care  Subjective: Patient is sitting up and resting in bed in no apparent distress.  Nurse is at bedside to monitor patient for first 15 minutes of infusion of 1 unit of packed RBC.  No family at bedside.  Patient shows no signs of pain.  HPI: 88 y.o. female  with past medical history of HTN, CAD, diastolic CHF, AVR (Coumadin), asthma, diabetes type 2, dementia (Aricept), chronic pain, decubitus ulcers, chronic venous stasis ulcers (Unna boots), functional quadriplegia, COPD, CKD (stage IIIa), and pituitary adenoma admitted on 07/26/2021 with AMS, weakness, and foul odor from buttocks with suspected UTI.   General surgery was consulted and was awaiting GOC discussion with family before proceeding.  I spoke with son Dellis Filbert yesterday, 1/11, wherein he shared he would give medical team an answer regarding amputation by end of day. No update from son to date.   Summary of counseling/coordination of care: After reviewing the patient's chart and assessing her at bedside, I spoke with the patient's son Merry Proud over the phone.  He shared that he did not want to address whether or not his mother would want surgery with her when he visited late yesterday evening.  Merry Proud shares he is planning to visit the hospital this afternoon and have a discussion with his mother regarding her current health status.  However, he also acknowledges that the patient has dementia, could not consent for blood, and is working  towards getting the power of attorney in place so that everyone will contact him for all medical decisions for the patient. I again asked Merry Proud is he had made a decision regarding whether to move forward with surgery or not he shared he has not talked with him mother yet to make a final decision.   I shared the patient was currently seething 1 unit of RBCs due to a low hemoglobin.  I again shared my concerns that the patient needs to have a clear plan of care.  He shared that he will visit with her today to see if she has any input on whether to move forward with amputation or to move forward with a comfort care path.  I reiterated to Merry Proud that I am available to help in any goals of care discussions that he chooses to have with his mother.  He said he would contact me if needed.  Palliative medicine team will continue to monitor the patient throughout her hospitalization.  Questions and concerns from [place] addressed.  Code Status: Full code  Prognosis: Unable to determine  Discharge Planning: To Be Determined  Recommendations/Plan: Decision needed for goals of care from son as pt is unable to participate in meaningful discussion regarding her medical decisions  Care plan was discussed with patient, nurse, patient's son Merry Proud  Physical Exam Vitals and nursing note reviewed.  Constitutional:      General: She is not in acute distress.  Appearance: Normal appearance. She is not toxic-appearing.  HENT:     Head: Normocephalic and atraumatic.     Mouth/Throat:     Mouth: Mucous membranes are moist.  Cardiovascular:     Rate and Rhythm: Normal rate.  Pulmonary:     Effort: Pulmonary effort is normal.  Abdominal:     Palpations: Abdomen is soft.  Skin:    General: Skin is warm.     Comments: Bilteral skin discoloration and wounds of LEs  Neurological:     Comments: nonverbal            Palliative Assessment/Data: 40%    Total Time 25 minutes  Greater than 50%  of this time  was spent counseling and coordinating care related to the above assessment and plan.  Thank you for allowing the Palliative Medicine Team to assist in the care of this patient.  [place] Ilsa Iha, FNP-BC Palliative Medicine Team Team Phone # [phone]

## 2021-08-01 NOTE — Progress Notes (Signed)
ANTICOAGULATION CONSULT NOTE  Pharmacy Consult for warfarin Indication:  mechanical valve  Patient Measurements: Weight: 83.9 kg (184 lb 15.5 oz)  Vital Signs: Temp: 97.6 F (36.4 C) (01/11 0553) Temp Source: Oral (01/11 0553) BP: 127/49 (01/11 0553) Pulse Rate: 82 (01/11 0553)  Labs: Recent Labs    07/30/21 0539 07/31/21 0523 08/01/21 0533  HGB 8.5* 7.5* 6.8*  HCT 27.1* 23.9* 21.3*  PLT 189 208 186  LABPROT 45.8* 33.5* 26.4*  INR 4.9* 3.3* 2.4*  CREATININE 0.75 0.75 1.01*     Estimated Creatinine Clearance: 39.4 mL/min (A) (by C-G formula based on SCr of 1.01 mg/dL (H)).   Medical History: Past Medical History:  Diagnosis Date   CHF (congestive heart failure) (HCC)    COPD (chronic obstructive pulmonary disease) (HCC)    Coronary artery disease    Dementia (HCC)    Diabetes mellitus without complication ([place])    Hyperlipidemia    Hypertension     Assessment: 88 year old female with altered mental status. Patient presented with INR 3.6 that has since trended up. Patient on warfarin for mechanical valve. Per review of Care Everywhere, it looks like she has had several supratherapeutic INRs in the past month.  Home warfarin regimen: 5 mg daily  Date INR Warfarin Dose  1/6 5.9 HELD  1/7 5.5 HELD  1/8 5.9 HELD  1/9 4.9 HELD  1/10 3.3 5 mg  1/11 2.4 6 mg   DDI: ceftriaxone  Goal of Therapy:  INR 2.5-3.5 Monitor platelets by anticoagulation protocol: Yes   Plan:  INR slightly subtherapeutic and trending down. Will order warfarin 6 mg for this evening. Hgb continues to trend down, 6.8 today. MD aware. Plan to transfuse 1 unit RBC Daily INR already ordered. CBC at least every 3 days.    Darnelle Bos, PharmD Clinical Pharmacist 08/01/2021 7:45 AM

## 2021-08-02 DIAGNOSIS — G9341 Metabolic encephalopathy: Secondary | ICD-10-CM | POA: Diagnosis not present

## 2021-08-02 DIAGNOSIS — Z7901 Long term (current) use of anticoagulants: Secondary | ICD-10-CM | POA: Diagnosis not present

## 2021-08-02 DIAGNOSIS — T148XXA Other injury of unspecified body region, initial encounter: Secondary | ICD-10-CM

## 2021-08-02 DIAGNOSIS — L03317 Cellulitis of buttock: Secondary | ICD-10-CM | POA: Diagnosis not present

## 2021-08-02 DIAGNOSIS — Z66 Do not resuscitate: Secondary | ICD-10-CM

## 2021-08-02 DIAGNOSIS — A419 Sepsis, unspecified organism: Secondary | ICD-10-CM | POA: Diagnosis not present

## 2021-08-02 LAB — TYPE AND SCREEN
ABO/RH(D): O POS
Antibody Screen: NEGATIVE
Unit division: 0

## 2021-08-02 LAB — BASIC METABOLIC PANEL
Anion gap: 4 — ABNORMAL LOW (ref 5–15)
BUN: 17 mg/dL (ref 8–23)
CO2: 28 mmol/L (ref 22–32)
Calcium: 7.7 mg/dL — ABNORMAL LOW (ref 8.9–10.3)
Chloride: 105 mmol/L (ref 98–111)
Creatinine, Ser: 0.87 mg/dL (ref 0.44–1.00)
GFR, Estimated: >60 mL/min (ref >60)
Glucose, Bld: 96 mg/dL (ref 70–99)
Potassium: 3.6 mmol/L (ref 3.5–5.1)
Sodium: 137 mmol/L (ref 135–145)

## 2021-08-02 LAB — CBC
HCT: 24.1 % — ABNORMAL LOW (ref 36.0–46.0)
Hemoglobin: 7.8 g/dL — ABNORMAL LOW (ref 12.0–15.0)
MCH: 29.2 pg (ref 26.0–34.0)
MCHC: 32.4 g/dL (ref 30.0–36.0)
MCV: 90.3 fL (ref 80.0–100.0)
Platelets: 173 10*3/uL (ref 150–400)
RBC: 2.67 MIL/uL — ABNORMAL LOW (ref 3.87–5.11)
RDW: 19.2 % — ABNORMAL HIGH (ref 11.5–15.5)
WBC: 9.4 10*3/uL (ref 4.0–10.5)
nRBC: 1.8 % — ABNORMAL HIGH (ref 0.0–0.2)

## 2021-08-02 LAB — BPAM RBC
Blood Product Expiration Date: 202302032359
ISSUE DATE / TIME: 202301111041
Unit Type and Rh: 5100

## 2021-08-02 LAB — PROTIME-INR
INR: 2.2 — ABNORMAL HIGH (ref 0.8–1.2)
Prothrombin Time: 24.2 seconds — ABNORMAL HIGH (ref 11.4–15.2)

## 2021-08-02 LAB — MAGNESIUM: Magnesium: 2.5 mg/dL — ABNORMAL HIGH (ref 1.7–2.4)

## 2021-08-02 MED ORDER — WARFARIN SODIUM 6 MG PO TABS
6.0000 mg | ORAL_TABLET | Freq: Once | ORAL | Status: AC
  Administered 2021-08-02: 6 mg via ORAL
  Filled 2021-08-02: qty 1

## 2021-08-02 MED ORDER — FERROUS SULFATE 325 (65 FE) MG PO TABS
325.0000 mg | ORAL_TABLET | Freq: Every day | ORAL | Status: DC
  Administered 2021-08-02 – 2021-08-16 (×15): 325 mg via ORAL
  Filled 2021-08-02 (×15): qty 1

## 2021-08-02 NOTE — Progress Notes (Signed)
ANTICOAGULATION CONSULT NOTE  Pharmacy Consult for warfarin Indication:  mechanical valve  Patient Measurements: Weight: 83.9 kg (184 lb 15.5 oz)  Vital Signs: Temp: 98.3 F (36.8 C) (01/12 0447) Temp Source: Oral (01/12 0447) BP: 147/64 (01/12 0447) Pulse Rate: 80 (01/12 0447)  Labs: Recent Labs    07/31/21 0523 08/01/21 0533 08/02/21 0636  HGB 7.5* 6.8* 7.8*  HCT 23.9* 21.3* 24.1*  PLT 208 186 173  LABPROT 33.5* 26.4* 24.2*  INR 3.3* 2.4* 2.2*  CREATININE 0.75 1.01*  --      Estimated Creatinine Clearance: 39.4 mL/min (A) (by C-G formula based on SCr of 1.01 mg/dL (H)).   Medical History: Past Medical History:  Diagnosis Date   CHF (congestive heart failure) (HCC)    COPD (chronic obstructive pulmonary disease) (HCC)    Coronary artery disease    Dementia (HCC)    Diabetes mellitus without complication ([place])    Hyperlipidemia    Hypertension     Assessment: 88 year old female with altered mental status. Patient presented with INR 3.6 that has since trended up. Patient on warfarin for mechanical valve. Per review of Care Everywhere, it looks like she has had several supratherapeutic INRs in the past month.  Home warfarin regimen: 5 mg daily  Date INR Warfarin Dose  1/6 5.9 HELD  1/7 5.5 HELD  1/8 5.9 HELD  1/9 4.9 HELD  1/10 3.3 5 mg  1/11 2.4 6 mg  1/12 2.2 6 mg   DDI: ceftriaxone - last dose 1/11  Goal of Therapy:  INR 2.5-3.5 Monitor platelets by anticoagulation protocol: Yes   Plan:  INR subtherapeutic and trending down in setting of recent held doses. Will order warfarin 6 mg for this evening. Conservative booster dosing since pt's family is still considering surgical intervention.  Hgb improved to 7.8 today after transfusion of 1 unit RBC yesterday Daily INR already ordered. CBC at least every 3 days  Darnelle Bos, PharmD Clinical Pharmacist 08/02/2021 7:43 AM

## 2021-08-02 NOTE — Progress Notes (Signed)
Progress note:  After reviewing the patient's chart, I assessed the patient at bedside.  She is in no apparent distress.  She is sleepy but easily arousable.  No family at bedside.  Plan and coordination of care:  I have spoken with the patient's son Dellis Filbert several times this week.  Dellis Filbert has confirmed with me twice on 2 separate days that he will make a decision regarding moving forward with amputation by the end of eachday.  To date, Dellis Filbert has not contacted PMT to discuss anything further.    Dellis Filbert has PMT contact info.   PMT will continue to be available to the patient and her family throughout her hospitalization.  However, we will monitor the patient on the peripheral and shadow her chart unless the medical team or her family wishes for [place] to re-engage.  [place] Ilsa Iha, FNP-BC Palliative Medicine Team Team Phone # [phone]  NO CHARGE

## 2021-08-02 NOTE — TOC Initial Note (Addendum)
Transition of Care [facility]) - Initial/Assessment Note    Patient Details  Name: Rhonda Saunders MRN: [id number] Date of Birth: November 14, 1931  Transition of Care [facility]) CM/SW Contact:    Candie Chroman, LCSW Phone Number: 08/02/2021, 12:26 PM  Clinical Narrative:  Patient not fully oriented. Met with son and another family member at bedside. They are interested in facility placement. Per MD, labs appropriate for PT/OT consults. Son wants long-term placement after rehab. They are unable to private pay. Sent secure email to financial counselor regarding LTC Medicaid application. No further concerns. CSW encouraged patient's son to contact CSW as needed. CSW will continue to follow patient and her son for support and facilitate discharge once medically stable.                1:54 pm: Development worker, community called son. He thinks patient is over income limit for Medicaid application so he asked her to hold off for now.  Expected Discharge Plan: Skilled Nursing Facility Barriers to Discharge: Continued Medical Work up   Patient Goals and CMS Choice        Expected Discharge Plan and Services Expected Discharge Plan: [place] Choice: [place] arrangements for the past 2 months: Single Family Home                                      Prior Living Arrangements/Services Living arrangements for the past 2 months: Single Family Home Lives with:: Adult Children Patient language and need for interpreter reviewed:: Yes Do you feel safe going back to the place where you live?: Yes      Need for Family Participation in Patient Care: Yes (Comment) Care giver support system in place?: Yes (comment)   Criminal Activity/Legal Involvement Pertinent to Current Situation/Hospitalization: No - Comment as needed  Activities of Daily Living   ADL Screening (condition at time of admission) Patient's cognitive ability adequate to safely complete  daily activities?: No Is the patient deaf or have difficulty hearing?: Yes Does the patient have difficulty seeing, even when wearing glasses/contacts?: No Does the patient have difficulty concentrating, remembering, or making decisions?: Yes Does the patient have difficulty dressing or bathing?: Yes Independently performs ADLs?: No Does the patient have difficulty walking or climbing stairs?: Yes Weakness of Legs: Both Weakness of Arms/Hands: Both  Permission Sought/Granted Permission sought to share information with : Facility Sport and exercise psychologist, Family Supports    Share Information with NAME: Hillary Struss  Permission granted to share info w AGENCY: SNF's  Permission granted to share info w Relationship: Son  Permission granted to share info w Contact Information: [phone]  Emotional Assessment Appearance:: Appears stated age Attitude/Demeanor/Rapport: Unable to Assess Affect (typically observed): Unable to Assess Orientation: : Oriented to Self, Oriented to Place Alcohol / Substance Use: Not Applicable Psych Involvement: No (comment)  Admission diagnosis:  Cellulitis of multiple sites of buttock [L03.317] Supratherapeutic INR [R79.1] Sepsis ([place]) [A41.9] Multiple wounds of skin [T14.8XXA] Patient Active Problem List   Diagnosis Date Noted   Sepsis ([place]) 07/26/2021   Decubitus ulcer, infected, unspecified pressure ulcer stage 07/26/2021   Cellulitis 07/26/2021   Functional quadriplegia ([place]) 07/26/2021   COPD (chronic obstructive pulmonary disease) (HCC)    Supratherapeutic INR    Acute metabolic encephalopathy    Stage 3a chronic kidney disease ([place]) 03/23/2021   Lower limb ulcer, ankle, right,  with fat layer exposed ([place]) 02/15/2020   Decubitus ulcer of sacral region, stage 2 ([place]) 02/10/2020   Lymphedema 07/13/2019   Chronic thoracic back pain (Right) 08/06/2018   Postherpetic neuralgia (Thoracic) (Right) 08/06/2018   Thoracic radicular pain (Right) 08/06/2018    Thoracic radiculitis (Right) 08/06/2018   Elevated C-reactive protein (CRP) 08/03/2018   Elevated sed rate 08/03/2018   Chronic upper back pain (Primary Area of Pain) (Right) 07/27/2018   Neurogenic pain 07/27/2018   Chronic lower extremity pain (Tertiary Area of Pain) (Left) 07/27/2018   Chronic pain syndrome 07/27/2018   Long term current use of opiate analgesic 07/27/2018   Pharmacologic therapy 07/27/2018   Disorder of skeletal system 07/27/2018   Problems influencing health status 07/27/2018   Pressure ulcer of ischium, stage 2, unspecified laterality ([place]) 05/16/2018   Altered mental status    Palliative care by specialist    Goals of care, counseling/discussion    AKI (acute kidney injury) ([place]) 05/13/2018   Pituitary tumor 02/20/2018   Senile dementia without behavioral disturbance ([place]) 01/29/2018   Moderate persistent asthma without complication 05/39/7673   Pure hypercholesterolemia 12/17/2016   BMI 35.0-35.9,adult 01/04/2016   Morbid obesity ([place]) 01/04/2016   Chronic low back pain (Bilateral) (R>L) w/o sciatica 10/06/2015   Type 2 diabetes, diet controlled ([place]) 10/06/2015   Chronic diastolic CHF (congestive heart failure) ([place]) 04/21/2014   Anticoagulated on warfarin 04/22/2012   Coronary artery disease 11/26/2011   Essential hypertension 11/26/2011   Hearing loss of right ear 11/26/2011   Hx of aortic valve replacement, mechanical 11/26/2011   Hyperlipidemia 11/26/2011   PCP:  Adin Hector, MD Pharmacy:   [place], [place] - [address] [place] [address] Phone: [phone] Fax: [phone]  [facility] [address], [place] - [place] [address] Phone: [phone] Fax: [phone]     Social Determinants of Health (SDOH) Interventions    Readmission Risk Interventions No flowsheet data found.

## 2021-08-02 NOTE — Progress Notes (Signed)
PROGRESS NOTE    Rhonda Saunders  UUE:[id number] DOB: 1932-05-30 DOA: 07/26/2021 PCP: Adin Hector, MD   Brief Narrative: 88 year old with past medical history significant for hypertension, CAD, diastolic heart failure, aortic valve replacement on Coumadin, asthma, diabetes, dementia, chronic pain, ischial decubitus ulcer, chronic venous stasis ulcer lower extremity on Unna boot, who presents to the ED complaining of weakness, full odor from wounds, concern for UTI.  Patient was also noted to be having altered mental status.    Assessment & Plan:   Principal Problem:   Sepsis ([place]) Active Problems:   Coronary artery disease   Chronic diastolic CHF (congestive heart failure) (HCC)   Essential hypertension   Hx of aortic valve replacement, mechanical   Anticoagulated on warfarin   Senile dementia without behavioral disturbance (HCC)   Type 2 diabetes, diet controlled (HCC)   Chronic pain syndrome   Lower limb ulcer, ankle, right, with fat layer exposed ([place])   Decubitus ulcer, infected, unspecified pressure ulcer stage   Cellulitis   COPD (chronic obstructive pulmonary disease) (HCC)   Stage 3a chronic kidney disease (HCC)   Supratherapeutic INR   Acute metabolic encephalopathy   Functional quadriplegia (HCC)   1-Decubitus ulcer buttock, unspecified pressure ulcer stage, cellulitis of buttock: -Patient presented with leukocytosis and lactic acidosis, no fevers or tachycardia. -Continue with IV vancomycin and cefepime -Wound care consulted. surgery consulted. Continue with wound care.   2-Right heel unstageable pressure ulcer, Right lower leg venous stasis unstageable ulcer Surgery  recommend amputation. Palliative care consulted for goals of care. Family has decide not to proceed with surgery.   Acute metabolic encephalopathy: Patient presented with altered mental status and lethargy. Probably related to medications, opioid and gabapentin OxyContin reduced to 10 mg twice  daily as needed Gabapentin reduced to home dose  History of aortic valve replacement, mechanical Supra therapeutic INR Coumadin per pharmacy INR; 2.0   Acute on chronic anemia, from blood loss Related to multiple blood drawn, bleeding from skin tears Hb down to 6.9. received 1 unit of packed red blood cell 1/11 Hb stable today. Start oral iron.   Skin tear of her left hand bleeding Exacerbated by swelling and supratherapeutic INR Applied Surgicel and compression dressing with Ace.  Functional Quadriplegia, obesity: PT OT consulted Chronic diastolic heart failure: Appears euvolemic CAD: Continue with statin Hypertension: Continue with Toprol Senile dementia without behavioral disturbance continue with Aricept Diabetes type 2: A1c 5.8. Chronic Pain syndrome neuropathic pain Continue OxyContin 10 mg twice daily as needed. COPD: Continue with albuterol as needed Stage IIIa CKD: Monitor renal function Sepsis was ruled out.  Pressure Injury Stage II -  Partial thickness loss of dermis presenting as a shallow open ulcer with a red, pink wound bed without slough. Patient have small stage 2 (Active)     Location: Buttocks  Location Orientation: Left  Staging: Stage II -  Partial thickness loss of dermis presenting as a shallow open ulcer with a red, pink wound bed without slough.  Wound Description (Comments): Patient have small stage 2  Present on Admission: Yes     Pressure Injury 07/27/21 Heel Right Stage 3 -  Full thickness tissue loss. Subcutaneous fat may be visible but bone, tendon or muscle are NOT exposed. (Active)  07/27/21 0612  Location: Heel  Location Orientation: Right  Staging: Stage 3 -  Full thickness tissue loss. Subcutaneous fat may be visible but bone, tendon or muscle are NOT exposed.  Wound Description (Comments):  Present on Admission:      Pressure Injury 07/27/21 Buttocks Bilateral Deep Tissue Pressure Injury - Purple or maroon localized area of  discolored intact skin or blood-filled blister due to damage of underlying soft tissue from pressure and/or shear. (Active)  07/27/21 0614  Location: Buttocks  Location Orientation: Bilateral  Staging: Deep Tissue Pressure Injury - Purple or maroon localized area of discolored intact skin or blood-filled blister due to damage of underlying soft tissue from pressure and/or shear.  Wound Description (Comments):   Present on Admission:      Nutrition Problem: Increased nutrient needs Etiology: wound healing    Signs/Symptoms: estimated needs    Interventions: MVI, Juven, Premier Protein  Estimated body mass index is 33.83 kg/m as calculated from the following:   Height as of 07/10/21: _0  (1.575 m).   Weight as of this encounter: 83.9 kg.   DVT prophylaxis: Coumadin  Code Status: DNR Family Communication: palliative care met with family.  Disposition Plan:  Status is: Inpatient  Remains inpatient appropriate because: anemia, multiples wound        Consultants:  General surgery Palliative   Procedures:    Antimicrobials:    Subjective: She is alert, denies pain.   Objective: Vitals:   08/01/21 2044 08/02/21 0447 08/02/21 0850 08/02/21 1140  BP: (!) 143/56 (!) 147/64 (!) 145/59 (!) 127/41  Pulse: 88 80 83 67  Resp: _1 Temp: 98.1 F (36.7 C) 98.3 F (36.8 C) (!) 97.5 F (36.4 C) 98 F (36.7 C)  TempSrc: Oral Oral Axillary   SpO2: 100% 99% 98% 98%  Weight:        Intake/Output Summary (Last 24 hours) at 08/02/2021 1449 Last data filed at 08/02/2021 1300 Gross per 24 hour  Intake 480 ml  Output 550 ml  Net -70 ml    Filed Weights   07/26/21 0301  Weight: 83.9 kg    Examination:  General exam: chronic ill appearing, weak.  Respiratory system: CTA Cardiovascular system: S 1, S 2 RRR Gastrointestinal system: BS present, soft, nt Central nervous system: alert, follows command Extremities: large right heel ulcers and necrotic  tissue. Right LE calf big open ulcer wound    Data Reviewed: I have personally reviewed following labs and imaging studies  CBC: Recent Labs  Lab 07/29/21 0519 07/30/21 0539 07/31/21 0523 08/01/21 0533 08/02/21 0636  WBC 10.7* 10.9* 12.9* 11.4* 9.4  HGB 8.6* 8.5* 7.5* 6.8* 7.8*  HCT 27.3* 27.1* 23.9* 21.3* 24.1*  MCV 91.3 91.9 90.9 91.4 90.3  PLT 181 189 208 186 188    Basic Metabolic Panel: Recent Labs  Lab 07/29/21 0519 07/30/21 0539 07/31/21 0523 08/01/21 0533 08/02/21 0636  NA 138 137 136 136 137  K 3.6 3.5 4.1 3.8 3.6  CL 109 109 107 106 105  CO2 _2 GLUCOSE 99 112* 108* 106* 96  BUN _3 CREATININE 0.85 0.75 0.75 1.01* 0.87  CALCIUM 7.8* 7.6* 7.6* 7.3* 7.7*  MG 2.2 2.2 2.4 2.3 2.5*    GFR: Estimated Creatinine Clearance: 45.7 mL/min (by C-G formula based on SCr of 0.87 mg/dL). Liver Function Tests: No results for input(s): AST, ALT, ALKPHOS, BILITOT, PROT, ALBUMIN in the last 168 hours. No results for input(s): LIPASE, AMYLASE in the last 168 hours. No results for input(s): AMMONIA in the last 168 hours. Coagulation Profile: Recent Labs  Lab 07/29/21 4166 07/30/21 0539 07/31/21 0630 08/01/21 0533 08/02/21 1601  INR 5.9* 4.9* 3.3* 2.4* 2.2*    Cardiac Enzymes: No results for input(s): CKTOTAL, CKMB, CKMBINDEX, TROPONINI in the last 168 hours. BNP (last 3 results) No results for input(s): PROBNP in the last 8760 hours. HbA1C: No results for input(s): HGBA1C in the last 72 hours. CBG: Recent Labs  Lab 07/26/21 2107 07/27/21 0841 07/27/21 1215 07/27/21 1716 07/27/21 2059  GLUCAP 104* 87 90 111* 106*    Lipid Profile: No results for input(s): CHOL, HDL, LDLCALC, TRIG, CHOLHDL, LDLDIRECT in the last 72 hours. Thyroid Function Tests: No results for input(s): TSH, T4TOTAL, FREET4, T3FREE, THYROIDAB in the last 72 hours. Anemia Panel: No results for input(s): VITAMINB12, FOLATE, FERRITIN, TIBC, IRON, RETICCTPCT in the  last 72 hours. Sepsis Labs: No results for input(s): PROCALCITON, LATICACIDVEN in the last 168 hours.   Recent Results (from the past 240 hour(s))  Culture, blood (Routine x 2)     Status: None   Collection Time: 07/25/21  2:25 PM   Specimen: BLOOD  Result Value Ref Range Status   Specimen Description BLOOD RIGHT ANTECUBITAL  Final   Special Requests   Final    BOTTLES DRAWN AEROBIC AND ANAEROBIC Blood Culture adequate volume   Culture   Final    NO GROWTH 5 DAYS Performed at [facility], [address] [place], [place] [address]    Report Status 07/30/2021 FINAL  Final  Culture, blood (Routine x 2)     Status: None   Collection Time: 07/25/21  4:22 PM   Specimen: BLOOD  Result Value Ref Range Status   Specimen Description BLOOD BLOOD RIGHT WRIST  Final   Special Requests   Final    BOTTLES DRAWN AEROBIC AND ANAEROBIC Blood Culture results may not be optimal due to an inadequate volume of blood received in culture bottles   Culture   Final    NO GROWTH 5 DAYS Performed at [facility], [address] [place], [place] [address]    Report Status 07/30/2021 FINAL  Final  Resp Panel by RT-PCR (Flu A&B, Covid) Nasopharyngeal Swab     Status: None   Collection Time: 07/26/21  1:44 AM   Specimen: Nasopharyngeal Swab; Nasopharyngeal(NP) swabs in vial transport medium  Result Value Ref Range Status   SARS Coronavirus 2 by RT PCR NEGATIVE NEGATIVE Final    Comment: (NOTE) SARS-CoV-2 target nucleic acids are NOT DETECTED.  The SARS-CoV-2 RNA is generally detectable in upper respiratory specimens during the acute phase of infection. The lowest concentration of SARS-CoV-2 viral copies this assay can detect is 138 copies/mL. A negative result does not preclude SARS-Cov-2 infection and should not be used as the sole basis for treatment or other patient management decisions. A negative result may occur with  improper specimen collection/handling, submission of  specimen other than nasopharyngeal swab, presence of viral mutation(s) within the areas targeted by this assay, and inadequate number of viral copies(<138 copies/mL). A negative result must be combined with clinical observations, patient history, and epidemiological information. The expected result is Negative.  Fact Sheet for Patients:  EntrepreneurPulse.com.au  Fact Sheet for Healthcare Providers:  IncredibleEmployment.be  This test is no t yet approved or cleared by the [place] FDA and  has been authorized for detection and/or diagnosis of SARS-CoV-2 by FDA under an Emergency Use Authorization (EUA). This EUA will remain  in effect (meaning this test can be used) for the duration of the COVID-19 declaration under Section 564(b)(1) of the Act, 21 U.S.C.section 360bbb-3(b)(1), unless  the authorization is terminated  or revoked sooner.       Influenza A by PCR NEGATIVE NEGATIVE Final   Influenza B by PCR NEGATIVE NEGATIVE Final    Comment: (NOTE) The Xpert Xpress SARS-CoV-2/FLU/RSV plus assay is intended as an aid in the diagnosis of influenza from Nasopharyngeal swab specimens and should not be used as a sole basis for treatment. Nasal washings and aspirates are unacceptable for Xpert Xpress SARS-CoV-2/FLU/RSV testing.  Fact Sheet for Patients: EntrepreneurPulse.com.au  Fact Sheet for Healthcare Providers: IncredibleEmployment.be  This test is not yet approved or cleared by the [place] FDA and has been authorized for detection and/or diagnosis of SARS-CoV-2 by FDA under an Emergency Use Authorization (EUA). This EUA will remain in effect (meaning this test can be used) for the duration of the COVID-19 declaration under Section 564(b)(1) of the Act, 21 U.S.C. section 360bbb-3(b)(1), unless the authorization is terminated or revoked.  Performed at [facility], [address], [place], [place] [address]   Urine Culture     Status: Abnormal   Collection Time: 07/26/21  5:12 AM   Specimen: Urine, Clean Catch  Result Value Ref Range Status   Specimen Description   Final    URINE, CLEAN CATCH Performed at [facility], [address], [place], [place] [address]    Special Requests   Final    NONE Performed at [facility], [address] [place],  [address]    Culture MULTIPLE SPECIES PRESENT, SUGGEST RECOLLECTION (A)  Final   Report Status 07/27/2021 FINAL  Final          Radiology Studies: No results found.      Scheduled Meds:  (feeding supplement) PROSource Plus  30 mL Oral BID BM   atorvastatin  80 mg Oral Daily   collagenase   Topical Daily   donepezil  5 mg Oral QHS   feeding supplement  237 mL Oral TID BM   ferrous sulfate  325 mg Oral Q breakfast   fluticasone furoate-vilanterol  1 puff Inhalation Daily   gabapentin  600 mg Oral BID   levothyroxine  25 mcg Oral Q0600   metoprolol succinate  12.5 mg Oral Daily   multivitamin with minerals  1 tablet Oral Daily   polyethylene glycol  17 g Oral Daily   warfarin  6 mg Oral ONCE-1600   Warfarin - Pharmacist Dosing Inpatient   Does not apply q1600   Continuous Infusions:     LOS: 7 days    Time spent: 35 minutes    Afnan Cadiente A Dasan Hardman, MD [facility]   If 7PM-7AM, please contact night-coverage [email or web address]  08/02/2021, 2:49 PM

## 2021-08-02 NOTE — Care Management Important Message (Signed)
Important Message  Patient Details  Name: Rhonda Saunders MRN: [id number] Date of Birth: 02/28/1932   Medicare Important Message Given:  Yes     [name] A Keisy Strickler 08/02/2021, 2:28 PM

## 2021-08-02 NOTE — Evaluation (Signed)
Physical Therapy Evaluation Patient Details Name: Rhonda Saunders MRN: [id number] DOB: 01-09-1932 Today's Date: 08/02/2021  History of Present Illness  Rhonda Saunders is a 88 y.o. female with history of CHF, COPD, hypertension, diabetes, hyperlipidemia, dementia, aortic valve replacement on Coumadin who presents to the emergency department for concerns for couple of days of generalized weakness, worsening mental status, subjective fevers. She has wounds to both of her legs and to her buttocks. with foul-smelling drainage. Family opting to decline surgical intervention at this time. Per notes, family preferring to "treat the treatable", but not to escalate care; focusing on maintaining comfort for patient  Clinical Impression  Patient supine in bed upon arrival to room; alert and oriented to self, location as hospital only.  Follows simple commands, but with limited insight into condition and role of therapist.  Denies pain at rest, but demonstrates significant pain with any attempts for repositioning in bed or for movement of LEs (FACES 8/10).  Globally weak and deconditioned throughout all extremities; moderate resting tremor to R UE, poor tolerance for movement to bilat LEs (requiring act assist/passive effort from therapist, tolerating only approx 10 degrees of movement all joints).  Audibly moaning, yelling in pain with repositioning and movement attempts.  Dep +2 for repositioning and scooting in bed; limited ability to actively assist.  Unsafe/unable to attempt OOB at this time.  Anticipate need for total care/hoyer lift for safe OOB efforts. Patient unable to actively participate in and tolerate formal rehab at this time.  Recommend equipment for dependant care (hospital bed, hoyer lift, WC and cushion) and LTC to manage care and promote comfort moving forward.  Will complete PT order at this time.  Please reconsult should status change.     Recommendations for follow up therapy are one component of a  multi-disciplinary discharge planning process, led by the attending physician.  Recommendations may be updated based on patient status, additional functional criteria and insurance authorization.  Follow Up Recommendations No PT follow up    Assistance Recommended at Discharge Frequent or constant Supervision/Assistance  Patient can return home with the following  Two people to help with walking and/or transfers;Two people to help with bathing/dressing/bathroom;Assistance with cooking/housework;Direct supervision/assist for medications management;Assistance with feeding;Direct supervision/assist for financial management;Help with stairs or ramp for entrance    Equipment Recommendations Hospital bed;Wheelchair (measurements PT);Wheelchair cushion (measurements PT) (hoyer lift)  Recommendations for Other Services       Functional Status Assessment       Precautions / Restrictions Precautions Precautions: Fall Restrictions Weight Bearing Restrictions: No      Mobility  Bed Mobility Overal bed mobility: Needs Assistance             General bed mobility comments: dep assist +2 for repositioning and scooting up in bed    Transfers                   General transfer comment: unable to tolerate    Ambulation/Gait               General Gait Details: unable to tolerate  Stairs            Wheelchair Mobility    Modified Rankin (Stroke Patients Only)       Balance  Pertinent Vitals/Pain Pain Assessment: Faces Faces Pain Scale: Hurts whole lot Pain Location: bilat LEs Pain Intervention(s): Limited activity within patient's tolerance;Monitored during session;Repositioned    Home Living Family/patient expects to be discharged to:: Private residence Living Arrangements: Children Available Help at Discharge: Family               Additional Comments: Patient limited historian;  unable to provide detail of living environment    Prior Function Prior Level of Function : Patient poor historian/Family not available             Mobility Comments: Per chart, has been receiving [facility] [place] services, but agency declining resumption of care; unable to manage needs in home.       Hand Dominance        Extremity/Trunk Assessment   Upper Extremity Assessment Upper Extremity Assessment: Generalized weakness (grossly 3-/5; moderate resting tremor R UE)    Lower Extremity Assessment Lower Extremity Assessment: Generalized weakness (grossly 2-/5 throughout; L ankle DF to neutral, R ankle DF lacking approx 10-15 degrees.  Bilat hips, knees tolerating only approx 10 degrees of act assist/passive ROM; moaning, yelling in pain with all attempts at LE movement.)       Communication   Communication: HOH  Cognition Arousal/Alertness: Awake/alert Behavior During Therapy: Anxious Overall Cognitive Status: No family/caregiver present to determine baseline cognitive functioning                                 General Comments: Oriented to self, location as hospital only; unaware of reason for admission.  Does follow simple, one step commands; pleasant and cooperative.  Extremely anxious with any attempts at movement        General Comments      Exercises     Assessment/Plan    PT Assessment Patient does not need any further PT services  PT Problem List         PT Treatment Interventions      PT Goals (Current goals can be found in the Care Plan section)  Acute Rehab PT Goals PT Goal Formulation: Patient unable to participate in goal setting Time For Goal Achievement: 08/02/21 Potential to Achieve Goals: Poor    Frequency       Co-evaluation               AM-PAC PT "6 Clicks" Mobility  Outcome Measure Help needed turning from your back to your side while in a flat bed without using bedrails?: Total Help needed moving from lying on your  back to sitting on the side of a flat bed without using bedrails?: Total Help needed moving to and from a bed to a chair (including a wheelchair)?: Total Help needed standing up from a chair using your arms (e.g., wheelchair or bedside chair)?: Total Help needed to walk in hospital room?: Total Help needed climbing 3-5 steps with a railing? : Total 6 Click Score: 6    End of Session   Activity Tolerance: Patient limited by pain Patient left: in bed;with call bell/phone within reach;with bed alarm set   PT Visit Diagnosis: Muscle weakness (generalized) (M62.81)    Time: 1440-1459 PT Time Calculation (min) (ACUTE ONLY): 19 min   Charges:   PT Evaluation $PT Eval Moderate Complexity: 1 Mod         Kort Stettler H. Owens Shark, PT, DPT, NCS 08/02/21, 4:41 PM [phone]

## 2021-08-02 NOTE — Progress Notes (Signed)
Palliative Care Progress Note, Assessment & Plan   Patient Name: Rhonda Saunders       Date: 08/02/2021 DOB: 1932-05-08  Age: 88 y.o. MRN#: [id number] Attending Physician: Elmarie Shiley, MD Primary Care Physician: Adin Hector, MD Admit Date: 07/26/2021  Reason for Consultation/Follow-up: Establishing goals of care  Subjective: Patient is sitting in bed in no apparent distress.  Both her sons are at bedside.  She acknowledges my presence and is able to make her wishes known.  HPI: 88 y.o. female  with past medical history of HTN, CAD, diastolic CHF, AVR (Coumadin), asthma, diabetes type 2, dementia (Aricept), chronic pain, decubitus ulcers, chronic venous stasis ulcers ([place] boots), functional quadriplegia, COPD, CKD (stage IIIa), and pituitary adenoma admitted on 07/26/2021 with AMS, weakness, and foul odor from buttocks with suspected UTI.  General surgery was consulted in regards to debridement of wounds and potential of amputation of lower extremity.  Summary of counseling/coordination of care: After reviewing the patient's chart and assessing her at bedside, I met with the patient and her sons Merry Proud and Richardson Landry.  Merry Proud attempted to elicit wishes from patient in regards to moving forward with surgery.  Patient deflected a few times and asked what Eligha Bridegroom, and myself thought she should do.  After encouraging the patient to make the decision for herself, she shared she would like to avoid surgery and just be kept comfortable.  Sons Merry Proud and Richardson Landry were in agreement.  CODE STATUS reviewed. Educated patient/family to consider DNR/DNI status understanding evidenced based poor outcomes in similar hospitalized patient, as the cause of arrest is likely associated with advanced chronic illness rather than an  easily reversible acute cardio-pulmonary event.    Discussed with patient and sons at bedside the importance of continuing conversations so that overall plan of care and treatment plan is in line with the patient's values and goals of care.  In light of patient avoiding surgery and vocalizing that she would like to be kept comfortable, patient and family in agreement with changing CODE STATUS to DNR.  Family is facing disposition decisions.  Dr. Marthenia Rolling, nurse, and [facility] SW made aware of patient family's decision to avoid surgery/amputation and to change CODE STATUS to DNR.  Code Status: DNR  Prognosis: Unable to determine  Discharge Planning: To Be Determined  Recommendations/Plan: DNR - paper copy placed in shadow chart  of motion.  °   Comments: Generalized weakness  °Skin: °   Comments: Ischial wounds, bilateral LE wounds  °Neurological:  °   Mental Status: She is alert. Mental status is at baseline.  °Psychiatric:     °   Mood and Affect: Mood normal.     °   Behavior: Behavior normal.     °   Thought Content: Thought content normal.     °   Judgment: Judgment normal.  °         ° °Palliative Assessment/Data: 40% ° ° ° °Total Time 35 minutes  °Greater than 50%  of this  time was spent counseling and coordinating care related to the above assessment and plan. ° °Thank you for allowing the Palliative Medicine Team to assist in the care of this patient. ° ° L. , DNP, FNP-BC °Palliative Medicine Team °Team Phone # [phone] °  °

## 2021-08-03 DIAGNOSIS — A419 Sepsis, unspecified organism: Secondary | ICD-10-CM | POA: Diagnosis not present

## 2021-08-03 LAB — CBC
HCT: 24.3 % — ABNORMAL LOW (ref 36.0–46.0)
Hemoglobin: 7.9 g/dL — ABNORMAL LOW (ref 12.0–15.0)
MCH: 29.4 pg (ref 26.0–34.0)
MCHC: 32.5 g/dL (ref 30.0–36.0)
MCV: 90.3 fL (ref 80.0–100.0)
Platelets: 175 10*3/uL (ref 150–400)
RBC: 2.69 MIL/uL — ABNORMAL LOW (ref 3.87–5.11)
RDW: 19.1 % — ABNORMAL HIGH (ref 11.5–15.5)
WBC: 9.8 10*3/uL (ref 4.0–10.5)
nRBC: 1 % — ABNORMAL HIGH (ref 0.0–0.2)

## 2021-08-03 LAB — MAGNESIUM: Magnesium: 2.4 mg/dL (ref 1.7–2.4)

## 2021-08-03 LAB — BASIC METABOLIC PANEL
Anion gap: 6 (ref 5–15)
BUN: 14 mg/dL (ref 8–23)
CO2: 25 mmol/L (ref 22–32)
Calcium: 7.6 mg/dL — ABNORMAL LOW (ref 8.9–10.3)
Chloride: 104 mmol/L (ref 98–111)
Creatinine, Ser: 0.7 mg/dL (ref 0.44–1.00)
GFR, Estimated: >60 mL/min (ref >60)
Glucose, Bld: 89 mg/dL (ref 70–99)
Potassium: 3.6 mmol/L (ref 3.5–5.1)
Sodium: 135 mmol/L (ref 135–145)

## 2021-08-03 LAB — PROTIME-INR
INR: 2.1 — ABNORMAL HIGH (ref 0.8–1.2)
Prothrombin Time: 23.3 seconds — ABNORMAL HIGH (ref 11.4–15.2)

## 2021-08-03 MED ORDER — VANCOMYCIN HCL IN DEXTROSE 1-5 GM/200ML-% IV SOLN
1000.0000 mg | INTRAVENOUS | Status: DC
  Administered 2021-08-04 – 2021-08-05 (×2): 1000 mg via INTRAVENOUS
  Filled 2021-08-03 (×3): qty 200

## 2021-08-03 MED ORDER — VANCOMYCIN HCL 1500 MG/300ML IV SOLN
1500.0000 mg | Freq: Once | INTRAVENOUS | Status: AC
  Administered 2021-08-03: 18:00:00 1500 mg via INTRAVENOUS
  Filled 2021-08-03: qty 300

## 2021-08-03 MED ORDER — SODIUM CHLORIDE 0.9 % IV SOLN
2.0000 g | INTRAVENOUS | Status: DC
  Administered 2021-08-03 – 2021-08-05 (×3): 2 g via INTRAVENOUS
  Filled 2021-08-03: qty 20
  Filled 2021-08-03 (×2): qty 2
  Filled 2021-08-03: qty 20

## 2021-08-03 MED ORDER — WARFARIN SODIUM 7.5 MG PO TABS
7.5000 mg | ORAL_TABLET | Freq: Once | ORAL | Status: AC
  Administered 2021-08-03: 16:00:00 7.5 mg via ORAL
  Filled 2021-08-03: qty 1

## 2021-08-03 NOTE — Progress Notes (Signed)
ANTICOAGULATION CONSULT NOTE  Pharmacy Consult for warfarin Indication:  mechanical valve  Patient Measurements: Weight: 83.9 kg (184 lb 15.5 oz)  Vital Signs: Temp: 98.4 F (36.9 C) (01/13 0800) Temp Source: Oral (01/13 0800) BP: 148/52 (01/13 0800) Pulse Rate: 70 (01/13 0800)  Labs: Recent Labs    08/01/21 0533 08/02/21 0636 08/03/21 0816  HGB 6.8* 7.8* 7.9*  HCT 21.3* 24.1* 24.3*  PLT 186 173 175  LABPROT 26.4* 24.2* 23.3*  INR 2.4* 2.2* 2.1*  CREATININE 1.01* 0.87 0.70     Estimated Creatinine Clearance: 49.7 mL/min (by C-G formula based on SCr of 0.7 mg/dL).   Medical History: Past Medical History:  Diagnosis Date   CHF (congestive heart failure) (HCC)    COPD (chronic obstructive pulmonary disease) (HCC)    Coronary artery disease    Dementia (HCC)    Diabetes mellitus without complication ([place])    Hyperlipidemia    Hypertension     Assessment: 88 year old female with altered mental status. Patient presented with INR 3.6 that has since trended up. Patient on warfarin for mechanical valve. Per review of Care Everywhere, it looks like she has had several supratherapeutic INRs in the past month.  Home warfarin regimen: 5 mg daily  Date INR Warfarin Dose  1/6 5.9 HELD  1/7 5.5 HELD  1/8 5.9 HELD  1/9 4.9 HELD  1/10 3.3 5 mg  1/11 2.4 6 mg  1/12 2.2 6 mg  1/13 2.1 7.5 mg    DDI: ceftriaxone - last dose 1/11  Goal of Therapy:  INR 2.5-3.5 Monitor platelets by anticoagulation protocol: Yes   Plan:  INR subtherapeutic and trending down in setting of recent held doses. Will order warfarin 7.5 mg for this evening. Family has decided to defer surgical intervention.  Hgb improved to 7.9 today Daily INR ordered. CBC at least every 3 days.  Darnelle Bos, PharmD Clinical Pharmacist 08/03/2021 10:04 AM

## 2021-08-03 NOTE — Consult Note (Signed)
Pharmacy Antibiotic Note  Rhonda Saunders is a 88 y.o. female with medical history including HTN, CAD, diastolic CHF, history of AVR, anticoagulation on warfarin, asthma, diabetes, dementia, ischial decubitus ulcer, chronic venous stasis of lower extremity admitted on 07/26/2021 with cellulitis of buttock and right heel and right lower leg ulcers.  Pharmacy has been consulted for vancomycin dosing. Patient is also ordered ceftriaxone.  Plan:  Vancomycin 1.5 g IV LD followed by maintenance regimen of vancomycin 1 g IV q24h --Calculated AUC: 448, Cmin 12 --Daily Scr per protocol --Levels at steady as clinically indicated  Weight: 83.9 kg (184 lb 15.5 oz)  Temp (24hrs), Avg:98.3 F (36.8 C), Min:97.8 F (36.6 C), Max:98.6 F (37 C)  Recent Labs  Lab 07/30/21 0539 07/31/21 0523 08/01/21 0533 08/02/21 0636 08/03/21 0816  WBC 10.9* 12.9* 11.4* 9.4 9.8  CREATININE 0.75 0.75 1.01* 0.87 0.70    Estimated Creatinine Clearance: 49.7 mL/min (by C-G formula based on SCr of 0.7 mg/dL).    Allergies  Allergen Reactions   Pentazocine Other (See Comments)    Other Reaction: Not Assessed     Antimicrobials this admission: Cefepime 1/5 x 1 Ceftriaxone 1/5 >> 1/11, 1/13 >> Vancomycin 1/5 >> 1/10, 1/13 >>  Dose adjustments this admission: N/A  Microbiology results: 1/4 BCx: NG 1/5 UCx: Multiple species   Thank you for allowing pharmacy to be a part of this patients care.  Benita Gutter 08/03/2021 4:57 PM

## 2021-08-03 NOTE — TOC Progression Note (Addendum)
Transition of Care [facility] - Progression Note    Patient Details  Name: REETA KUK MRN: [id number] Date of Birth: September 07, 1931  Transition of Care [facility]) CM/SW [place], LCSW Phone Number: 08/03/2021, 1:13 PM  Clinical Narrative:   Attempted call to son Dellis Filbert to follow up on plan. No answer. VM full.   4:55- Called son Dellis Filbert again. He stated he did speak with Financial Counseling. Dellis Filbert stated they cannot afford to private pay for placement, but also do not think they can apply for Medicaid for the patient due to her income being over the limit. Dellis Filbert is agreeable to a [employer] referral for support/guidance. CSW reached out to [facility] with [employer] to make referral. Andee Poles stated she will reach out to [place] about options and keep this CSW and weekday CSW Judson Roch updated.    Expected Discharge Plan: [place] Barriers to Discharge: Continued Medical Work up  Expected Discharge Plan and Services Expected Discharge Plan: [place] Choice: [place] arrangements for the past 2 months: Single Family Home                                       Social Determinants of Health (SDOH) Interventions    Readmission Risk Interventions No flowsheet data found.

## 2021-08-03 NOTE — Progress Notes (Addendum)
PROGRESS NOTE    Rhonda AUGUSTE  Saunders:[id number] DOB: Nov 17, 1931 DOA: 07/26/2021 PCP: Adin Hector, MD   Brief Narrative: 88 year old with past medical history significant for hypertension, CAD, diastolic heart failure, aortic valve replacement on Coumadin, asthma, diabetes, dementia, chronic pain, ischial decubitus ulcer, chronic venous stasis ulcer lower extremity on Unna boot, who presents to the ED complaining of weakness, full odor from wounds, concern for UTI.  Patient was also noted to be having altered mental status.    Assessment & Plan:   Principal Problem:   Sepsis ([place]) Active Problems:   Coronary artery disease   Chronic diastolic CHF (congestive heart failure) (HCC)   Essential hypertension   Hx of aortic valve replacement, mechanical   Anticoagulated on warfarin   Senile dementia without behavioral disturbance (HCC)   Type 2 diabetes, diet controlled (HCC)   Chronic pain syndrome   Lower limb ulcer, ankle, right, with fat layer exposed ([place])   Decubitus ulcer, infected, unspecified pressure ulcer stage   Cellulitis   COPD (chronic obstructive pulmonary disease) (HCC)   Stage 3a chronic kidney disease (HCC)   Supratherapeutic INR   Acute metabolic encephalopathy   Functional quadriplegia (HCC)   1-Decubitus ulcer buttock, unspecified pressure ulcer stage, cellulitis of buttock: -Patient presented with leukocytosis and lactic acidosis, no fevers or tachycardia. -Continue with IV vancomycin  change cefepime to ceftriaxone.  -Wound care consulted. surgery consulted. -Continue with wound care.   2-Right heel unstageable pressure ulcer, Right lower leg venous stasis unstageable ulcer Surgery  recommend amputation. Palliative care consulted for goals of care. Family has decide not to proceed with surgery.   Acute metabolic encephalopathy: Patient presented with altered mental status and lethargy. Probably related to medications, opioid and gabapentin OxyContin  reduced to 10 mg twice daily as needed Gabapentin reduced to home dose Sleepy this am.   History of aortic valve replacement, mechanical Supra therapeutic INR Coumadin per pharmacy INR; 2.0 --2.2  Acute on chronic anemia, from blood loss Related to multiple blood drawn, bleeding from skin tears Hb down to 6.9. received 1 unit of packed red blood cell 1/11 Hb stable today. Started  oral iron.   Skin tear of her left hand bleeding Exacerbated by swelling and supratherapeutic INR Applied Surgicel and compression dressing with Ace.  Functional Quadriplegia, obesity: PT OT consulted Chronic diastolic heart failure: Appears euvolemic CAD: Continue with statin Hypertension: Continue with Toprol Senile dementia without behavioral disturbance continue with Aricept Diabetes type 2: A1c 5.8. Chronic Pain syndrome neuropathic pain Continue OxyContin 10 mg twice daily as needed. COPD: Continue with albuterol as needed Stage IIIa CKD: Monitor renal function Sepsis was ruled out.  Pressure Injury Stage II -  Partial thickness loss of dermis presenting as a shallow open ulcer with a red, pink wound bed without slough. Patient have small stage 2 (Active)     Location: Buttocks  Location Orientation: Left  Staging: Stage II -  Partial thickness loss of dermis presenting as a shallow open ulcer with a red, pink wound bed without slough.  Wound Description (Comments): Patient have small stage 2  Present on Admission: Yes     Pressure Injury 07/27/21 Heel Right Stage 3 -  Full thickness tissue loss. Subcutaneous fat may be visible but bone, tendon or muscle are NOT exposed. (Active)  07/27/21 0612  Location: Heel  Location Orientation: Right  Staging: Stage 3 -  Full thickness tissue loss. Subcutaneous fat may be visible but bone, tendon or muscle  are NOT exposed.  Wound Description (Comments):   Present on Admission:      Pressure Injury 07/27/21 Buttocks Bilateral Deep Tissue Pressure  Injury - Purple or maroon localized area of discolored intact skin or blood-filled blister due to damage of underlying soft tissue from pressure and/or shear. (Active)  07/27/21 0614  Location: Buttocks  Location Orientation: Bilateral  Staging: Deep Tissue Pressure Injury - Purple or maroon localized area of discolored intact skin or blood-filled blister due to damage of underlying soft tissue from pressure and/or shear.  Wound Description (Comments):   Present on Admission:      Nutrition Problem: Increased nutrient needs Etiology: wound healing    Signs/Symptoms: estimated needs    Interventions: MVI, Juven, Premier Protein  Estimated body mass index is 33.83 kg/m as calculated from the following:   Height as of 07/10/21: 5\' 2"  (1.575 m).   Weight as of this encounter: 83.9 kg.   DVT prophylaxis: Coumadin  Code Status: DNR Family Communication: Son over phone.  Disposition Plan:  Status is: Inpatient  Remains inpatient appropriate because: anemia, multiples wound. She will need SNF for long term care and Palliative care follow up.         Consultants:  General surgery Palliative   Procedures:    Antimicrobials:    Subjective: Sleepy, report ate breakfast.   Objective: Vitals:   08/02/21 2138 08/03/21 0553 08/03/21 0800 08/03/21 1135  BP: (!) 141/53 (!) 141/62 (!) 148/52 (!) 132/58  Pulse: 71 79 70 65  Resp: 18 18 16 17   Temp: 98.6 F (37 C) 98.3 F (36.8 C) 98.4 F (36.9 C) 97.8 F (36.6 C)  TempSrc: Oral Oral Oral Axillary  SpO2: 98% 97% 96% 100%  Weight:        Intake/Output Summary (Last 24 hours) at 08/03/2021 1420 Last data filed at 08/03/2021 9562 Gross per 24 hour  Intake 240 ml  Output 500 ml  Net -260 ml    Filed Weights   07/26/21 0301  Weight: 83.9 kg    Examination:  General exam: Chronic ill appearing Respiratory system: CTA Cardiovascular system: S 1, S 2 RRR click mechanical valver Gastrointestinal system: BS  present, soft, nt Central nervous system: Sleepy Extremities: large right heel ulcers and necrotic tissue. Right LE calf big open ulcer wound    Data Reviewed: I have personally reviewed following labs and imaging studies  CBC: Recent Labs  Lab 07/30/21 0539 07/31/21 0523 08/01/21 0533 08/02/21 0636 08/03/21 0816  WBC 10.9* 12.9* 11.4* 9.4 9.8  HGB 8.5* 7.5* 6.8* 7.8* 7.9*  HCT 27.1* 23.9* 21.3* 24.1* 24.3*  MCV 91.9 90.9 91.4 90.3 90.3  PLT 189 208 186 173 130    Basic Metabolic Panel: Recent Labs  Lab 07/30/21 0539 07/31/21 0523 08/01/21 0533 08/02/21 0636 08/03/21 0816  NA 137 136 136 137 135  K 3.5 4.1 3.8 3.6 3.6  CL 109 107 106 105 104  CO2 24 25 27 28 25   GLUCOSE 112* 108* 106* 96 89  BUN 14 13 16 17 14   CREATININE 0.75 0.75 1.01* 0.87 0.70  CALCIUM 7.6* 7.6* 7.3* 7.7* 7.6*  MG 2.2 2.4 2.3 2.5* 2.4    GFR: Estimated Creatinine Clearance: 49.7 mL/min (by C-G formula based on SCr of 0.7 mg/dL). Liver Function Tests: No results for input(s): AST, ALT, ALKPHOS, BILITOT, PROT, ALBUMIN in the last 168 hours. No results for input(s): LIPASE, AMYLASE in the last 168 hours. No results for input(s): AMMONIA in the last  168 hours. Coagulation Profile: Recent Labs  Lab 07/30/21 0539 07/31/21 0523 08/01/21 0533 08/02/21 0636 08/03/21 0816  INR 4.9* 3.3* 2.4* 2.2* 2.1*    Cardiac Enzymes: No results for input(s): CKTOTAL, CKMB, CKMBINDEX, TROPONINI in the last 168 hours. BNP (last 3 results) No results for input(s): PROBNP in the last 8760 hours. HbA1C: No results for input(s): HGBA1C in the last 72 hours. CBG: Recent Labs  Lab 07/27/21 1716 07/27/21 2059  GLUCAP 111* 106*    Lipid Profile: No results for input(s): CHOL, HDL, LDLCALC, TRIG, CHOLHDL, LDLDIRECT in the last 72 hours. Thyroid Function Tests: No results for input(s): TSH, T4TOTAL, FREET4, T3FREE, THYROIDAB in the last 72 hours. Anemia Panel: No results for input(s): VITAMINB12,  FOLATE, FERRITIN, TIBC, IRON, RETICCTPCT in the last 72 hours. Sepsis Labs: No results for input(s): PROCALCITON, LATICACIDVEN in the last 168 hours.   Recent Results (from the past 240 hour(s))  Culture, blood (Routine x 2)     Status: None   Collection Time: 07/25/21  2:25 PM   Specimen: BLOOD  Result Value Ref Range Status   Specimen Description BLOOD RIGHT ANTECUBITAL  Final   Special Requests   Final    BOTTLES DRAWN AEROBIC AND ANAEROBIC Blood Culture adequate volume   Culture   Final    NO GROWTH 5 DAYS Performed at [facility], [address] [place], [place] [address]    Report Status 07/30/2021 FINAL  Final  Culture, blood (Routine x 2)     Status: None   Collection Time: 07/25/21  4:22 PM   Specimen: BLOOD  Result Value Ref Range Status   Specimen Description BLOOD BLOOD RIGHT WRIST  Final   Special Requests   Final    BOTTLES DRAWN AEROBIC AND ANAEROBIC Blood Culture results may not be optimal due to an inadequate volume of blood received in culture bottles   Culture   Final    NO GROWTH 5 DAYS Performed at [facility], [address] [place], [place] [address]    Report Status 07/30/2021 FINAL  Final  Resp Panel by RT-PCR (Flu A&B, Covid) Nasopharyngeal Swab     Status: None   Collection Time: 07/26/21  1:44 AM   Specimen: Nasopharyngeal Swab; Nasopharyngeal(NP) swabs in vial transport medium  Result Value Ref Range Status   SARS Coronavirus 2 by RT PCR NEGATIVE NEGATIVE Final    Comment: (NOTE) SARS-CoV-2 target nucleic acids are NOT DETECTED.  The SARS-CoV-2 RNA is generally detectable in upper respiratory specimens during the acute phase of infection. The lowest concentration of SARS-CoV-2 viral copies this assay can detect is 138 copies/mL. A negative result does not preclude SARS-Cov-2 infection and should not be used as the sole basis for treatment or other patient management decisions. A negative result may occur with  improper  specimen collection/handling, submission of specimen other than nasopharyngeal swab, presence of viral mutation(s) within the areas targeted by this assay, and inadequate number of viral copies(<138 copies/mL). A negative result must be combined with clinical observations, patient history, and epidemiological information. The expected result is Negative.  Fact Sheet for Patients:  EntrepreneurPulse.com.au  Fact Sheet for Healthcare Providers:  IncredibleEmployment.be  This test is no t yet approved or cleared by the [place] FDA and  has been authorized for detection and/or diagnosis of SARS-CoV-2 by FDA under an Emergency Use Authorization (EUA). This EUA will remain  in effect (meaning this test can be used) for the duration of the COVID-19 declaration  under Section 564(b)(1) of the Act, 21 U.S.C.section 360bbb-3(b)(1), unless the authorization is terminated  or revoked sooner.       Influenza A by PCR NEGATIVE NEGATIVE Final   Influenza B by PCR NEGATIVE NEGATIVE Final    Comment: (NOTE) The Xpert Xpress SARS-CoV-2/FLU/RSV plus assay is intended as an aid in the diagnosis of influenza from Nasopharyngeal swab specimens and should not be used as a sole basis for treatment. Nasal washings and aspirates are unacceptable for Xpert Xpress SARS-CoV-2/FLU/RSV testing.  Fact Sheet for Patients: EntrepreneurPulse.com.au  Fact Sheet for Healthcare Providers: IncredibleEmployment.be  This test is not yet approved or cleared by the [place] FDA and has been authorized for detection and/or diagnosis of SARS-CoV-2 by FDA under an Emergency Use Authorization (EUA). This EUA will remain in effect (meaning this test can be used) for the duration of the COVID-19 declaration under Section 564(b)(1) of the Act, 21 U.S.C. section 360bbb-3(b)(1), unless the authorization is terminated or revoked.  Performed at  [facility], [address] [place], [place] [address]   Urine Culture     Status: Abnormal   Collection Time: 07/26/21  5:12 AM   Specimen: Urine, Clean Catch  Result Value Ref Range Status   Specimen Description   Final    URINE, CLEAN CATCH Performed at [facility], [address] [place], [place] [address]    Special Requests   Final    NONE Performed at [facility] [address] [place], [place] [address]    Culture MULTIPLE SPECIES PRESENT, SUGGEST RECOLLECTION (A)  Final   Report Status 07/27/2021 FINAL  Final          Radiology Studies: No results found.      Scheduled Meds:  (feeding supplement) PROSource Plus  30 mL Oral BID BM   atorvastatin  80 mg Oral Daily   collagenase   Topical Daily   donepezil  5 mg Oral QHS   feeding supplement  237 mL Oral TID BM   ferrous sulfate  325 mg Oral Q breakfast   fluticasone furoate-vilanterol  1 puff Inhalation Daily   gabapentin  600 mg Oral BID   levothyroxine  25 mcg Oral Q0600   metoprolol succinate  12.5 mg Oral Daily   multivitamin with minerals  1 tablet Oral Daily   polyethylene glycol  17 g Oral Daily   warfarin  7.5 mg Oral ONCE-1600   Warfarin - Pharmacist Dosing Inpatient   Does not apply q1600   Continuous Infusions:     LOS: 8 days    Time spent: 35 minutes    Ariyan Brisendine A Michai Dieppa, MD [facility]   If 7PM-7AM, please contact night-coverage [email or web address]  08/03/2021, 2:20 PM

## 2021-08-03 NOTE — Progress Notes (Signed)
OT Cancellation Note  Patient Details Name: Rhonda Saunders MRN: [id number] DOB: [birth year]   Cancelled Treatment:    Reason Eval/Treat Not Completed: OT screened, no needs identified, will sign off. Order received, chart reviewed. Pt noted to be previously evaluated this admission. Per PT evaluation this admission, pt is dependant/total A with inability to participate in OT/PT at this time. Will sign off, please re-consult if pt becomes appropriate to participate.   Dessie Coma, M.S. OTR/L  08/03/21, 8:21 AM  ascom [phone]

## 2021-08-04 DIAGNOSIS — A419 Sepsis, unspecified organism: Secondary | ICD-10-CM | POA: Diagnosis not present

## 2021-08-04 LAB — BASIC METABOLIC PANEL
Anion gap: 7 (ref 5–15)
BUN: 13 mg/dL (ref 8–23)
CO2: 27 mmol/L (ref 22–32)
Calcium: 7.9 mg/dL — ABNORMAL LOW (ref 8.9–10.3)
Chloride: 103 mmol/L (ref 98–111)
Creatinine, Ser: 0.72 mg/dL (ref 0.44–1.00)
GFR, Estimated: >60 mL/min (ref >60)
Glucose, Bld: 99 mg/dL (ref 70–99)
Potassium: 3.7 mmol/L (ref 3.5–5.1)
Sodium: 137 mmol/L (ref 135–145)

## 2021-08-04 LAB — CBC
HCT: 25 % — ABNORMAL LOW (ref 36.0–46.0)
Hemoglobin: 8.1 g/dL — ABNORMAL LOW (ref 12.0–15.0)
MCH: 29.6 pg (ref 26.0–34.0)
MCHC: 32.4 g/dL (ref 30.0–36.0)
MCV: 91.2 fL (ref 80.0–100.0)
Platelets: 181 10*3/uL (ref 150–400)
RBC: 2.74 MIL/uL — ABNORMAL LOW (ref 3.87–5.11)
RDW: 19.2 % — ABNORMAL HIGH (ref 11.5–15.5)
WBC: 9.7 10*3/uL (ref 4.0–10.5)
nRBC: 0.4 % — ABNORMAL HIGH (ref 0.0–0.2)

## 2021-08-04 LAB — MAGNESIUM: Magnesium: 2.5 mg/dL — ABNORMAL HIGH (ref 1.7–2.4)

## 2021-08-04 LAB — PROTIME-INR
INR: 2.4 — ABNORMAL HIGH (ref 0.8–1.2)
Prothrombin Time: 25.8 seconds — ABNORMAL HIGH (ref 11.4–15.2)

## 2021-08-04 MED ORDER — WARFARIN SODIUM 6 MG PO TABS
6.0000 mg | ORAL_TABLET | Freq: Once | ORAL | Status: AC
  Administered 2021-08-04: 6 mg via ORAL
  Filled 2021-08-04: qty 1

## 2021-08-04 NOTE — Progress Notes (Signed)
ANTICOAGULATION CONSULT NOTE  Pharmacy Consult for warfarin Indication:  mechanical valve  Patient Measurements: Weight: 83.9 kg (184 lb 15.5 oz)  Vital Signs: Temp: 98.1 F (36.7 C) (01/14 0802) Temp Source: Oral (01/14 0457) BP: 143/51 (01/14 0802) Pulse Rate: 73 (01/14 0802)  Labs: Recent Labs    08/02/21 0636 08/03/21 0816 08/04/21 0619  HGB 7.8* 7.9* 8.1*  HCT 24.1* 24.3* 25.0*  PLT 173 175 181  LABPROT 24.2* 23.3* 25.8*  INR 2.2* 2.1* 2.4*  CREATININE 0.87 0.70 0.72     Estimated Creatinine Clearance: 49.7 mL/min (by C-G formula based on SCr of 0.72 mg/dL).   Medical History: Past Medical History:  Diagnosis Date   CHF (congestive heart failure) (HCC)    COPD (chronic obstructive pulmonary disease) (HCC)    Coronary artery disease    Dementia (HCC)    Diabetes mellitus without complication ([place])    Hyperlipidemia    Hypertension     Assessment: 88 year old female with altered mental status. Patient presented with INR 3.6 that has since trended up. Patient on warfarin for mechanical valve. Per review of Care Everywhere, it looks like she has had several supratherapeutic INRs in the past month.  Home warfarin regimen: 5 mg daily  Date INR Warfarin Dose  1/6 5.9 HELD  1/7 5.5 HELD  1/8 5.9 HELD  1/9 4.9 HELD  1/10 3.3 5 mg  1/11 2.4 6 mg  1/12 2.2 6 mg  1/13 2.1 7.5 mg   1/14 2.4            DDI: ceftriaxone - last dose 1/11  Goal of Therapy:  INR 2.5-3.5 Monitor platelets by anticoagulation protocol: Yes   Plan:  INR subtherapeutic but now trending up. Will order warfarin 6 mg for this evening. Family has decided to defer surgical intervention.  Hgb  7.9 >> 8.1 Daily INR ordered. CBC at least every 3 days.  Noralee Space, PharmD Clinical Pharmacist 08/04/2021 11:01 AM

## 2021-08-04 NOTE — Progress Notes (Signed)
PROGRESS NOTE    Rhonda Saunders  RDE:[id number] DOB: [birth year] DOA: 07/26/2021 PCP: Adin Hector, MD   Brief Narrative: 88 year old with past medical history significant for hypertension, CAD, diastolic heart failure, aortic valve replacement on Coumadin, asthma, diabetes, dementia, chronic pain, ischial decubitus ulcer, chronic venous stasis ulcer lower extremity on Unna boot, who presents to the ED complaining of weakness, full odor from wounds, concern for UTI.  Patient was also noted to be having altered mental status.    Assessment & Plan:   Principal Problem:   Sepsis ([place]) Active Problems:   Coronary artery disease   Chronic diastolic CHF (congestive heart failure) (HCC)   Essential hypertension   Hx of aortic valve replacement, mechanical   Anticoagulated on warfarin   Senile dementia without behavioral disturbance (HCC)   Type 2 diabetes, diet controlled (HCC)   Chronic pain syndrome   Lower limb ulcer, ankle, right, with fat layer exposed ([place])   Decubitus ulcer, infected, unspecified pressure ulcer stage   Cellulitis   COPD (chronic obstructive pulmonary disease) (HCC)   Stage 3a chronic kidney disease (HCC)   Supratherapeutic INR   Acute metabolic encephalopathy   Functional quadriplegia (HCC)   1-Decubitus ulcer buttock, unspecified pressure ulcer stage, cellulitis of buttock: -Patient presented with leukocytosis and lactic acidosis, no fevers or tachycardia. -Continue with IV vancomycin  change cefepime to ceftriaxone.  -Wound care consulted. surgery consulted. -Continue with wound care.   2-Right heel unstageable pressure ulcer, Right lower leg venous stasis unstageable ulcer Surgery  recommend amputation. Palliative care consulted for goals of care. Family has decide not to proceed with surgery.  Plan to transfer to SNF for long term care and palliative care follow up/.   Acute metabolic encephalopathy: Patient presented with altered mental status and  lethargy. Probably related to medications, opioid and gabapentin OxyContin reduced to 10 mg twice daily as needed Gabapentin reduced to home dose She is alert today, report pain is controlled.   History of aortic valve replacement, mechanical Supra therapeutic INR Coumadin per pharmacy INR; 2.0 --2.2--2.4  Acute on chronic anemia, from blood loss Related to multiple blood drawn, bleeding from skin tears Hb down to 6.9. received 1 unit of packed red blood cell 1/11 Hb stable today. Started  oral iron.   Skin tear of her left hand bleeding Exacerbated by swelling and supratherapeutic INR Applied Surgicel and compression dressing with Ace.  Functional Quadriplegia, obesity: need long term care.  Chronic diastolic heart failure: Appears euvolemic CAD: Continue with statin Hypertension: Continue with Toprol Senile dementia without behavioral disturbance continue with Aricept Diabetes type 2: A1c 5.8. SSI Chronic Pain syndrome neuropathic pain Continue OxyContin 10 mg twice daily as needed. COPD: Continue with albuterol as needed Stage IIIa CKD: Monitor renal function Sepsis was ruled out.  Pressure Injury Stage II -  Partial thickness loss of dermis presenting as a shallow open ulcer with a red, pink wound bed without slough. Patient have small stage 2 (Active)     Location: Buttocks  Location Orientation: Left  Staging: Stage II -  Partial thickness loss of dermis presenting as a shallow open ulcer with a red, pink wound bed without slough.  Wound Description (Comments): Patient have small stage 2  Present on Admission: Yes     Pressure Injury 07/27/21 Heel Right Stage 3 -  Full thickness tissue loss. Subcutaneous fat may be visible but bone, tendon or muscle are NOT exposed. (Active)  07/27/21 0612  Location: Heel  Location Orientation: Right  Staging: Stage 3 -  Full thickness tissue loss. Subcutaneous fat may be visible but bone, tendon or muscle are NOT exposed.  Wound  Description (Comments):   Present on Admission:      Pressure Injury 07/27/21 Buttocks Bilateral Deep Tissue Pressure Injury - Purple or maroon localized area of discolored intact skin or blood-filled blister due to damage of underlying soft tissue from pressure and/or shear. (Active)  07/27/21 0614  Location: Buttocks  Location Orientation: Bilateral  Staging: Deep Tissue Pressure Injury - Purple or maroon localized area of discolored intact skin or blood-filled blister due to damage of underlying soft tissue from pressure and/or shear.  Wound Description (Comments):   Present on Admission:      Nutrition Problem: Increased nutrient needs Etiology: wound healing    Signs/Symptoms: estimated needs    Interventions: MVI, Juven, Premier Protein  Estimated body mass index is 33.83 kg/m as calculated from the following:   Height as of 07/10/21: 5\' 2"  (1.575 m).   Weight as of this encounter: 83.9 kg.   DVT prophylaxis: Coumadin  Code Status: DNR Family Communication: Son over phone 1/13 Disposition Plan:  Status is: Inpatient  Remains inpatient appropriate because: anemia, multiples wound. She will need SNF for long term care and Palliative care follow up.         Consultants:  General surgery Palliative   Procedures:    Antimicrobials:    Subjective: She is alert, denies worsening pain. She is more alert today   Objective: Vitals:   08/03/21 2030 08/04/21 0457 08/04/21 0802 08/04/21 1225  BP: (!) 156/56 136/66 (!) 143/51 (!) 124/92  Pulse: 68 73 73 70  Resp: 18 18 18 18   Temp: 98.1 F (36.7 C) (!) 97.5 F (36.4 C) 98.1 F (36.7 C) 98.6 F (37 C)  TempSrc: Oral Oral    SpO2: 99% 99% 98% 98%  Weight:        Intake/Output Summary (Last 24 hours) at 08/04/2021 1501 Last data filed at 08/04/2021 0804 Gross per 24 hour  Intake --  Output 1100 ml  Net -1100 ml    Filed Weights   07/26/21 0301  Weight: 83.9 kg    Examination:  General exam:  Chronic ill appearing Respiratory system: CTA Cardiovascular system: S 1, S 2 RRR  mechanical valve click.  Gastrointestinal system: BS present, soft, nt Central nervous system: more alert today  Extremities: large right heel ulcers and necrotic tissue. Right LE calf big open ulcer wound    Data Reviewed: I have personally reviewed following labs and imaging studies  CBC: Recent Labs  Lab 07/31/21 0523 08/01/21 0533 08/02/21 0636 08/03/21 0816 08/04/21 0619  WBC 12.9* 11.4* 9.4 9.8 9.7  HGB 7.5* 6.8* 7.8* 7.9* 8.1*  HCT 23.9* 21.3* 24.1* 24.3* 25.0*  MCV 90.9 91.4 90.3 90.3 91.2  PLT 208 186 173 175 676    Basic Metabolic Panel: Recent Labs  Lab 07/31/21 0523 08/01/21 0533 08/02/21 0636 08/03/21 0816 08/04/21 0619  NA 136 136 137 135 137  K 4.1 3.8 3.6 3.6 3.7  CL 107 106 105 104 103  CO2 25 27 28 25 27   GLUCOSE 108* 106* 96 89 99  BUN 13 16 17 14 13   CREATININE 0.75 1.01* 0.87 0.70 0.72  CALCIUM 7.6* 7.3* 7.7* 7.6* 7.9*  MG 2.4 2.3 2.5* 2.4 2.5*    GFR: Estimated Creatinine Clearance: 49.7 mL/min (by C-G formula based on SCr of 0.72 mg/dL). Liver Function Tests:  No results for input(s): AST, ALT, ALKPHOS, BILITOT, PROT, ALBUMIN in the last 168 hours. No results for input(s): LIPASE, AMYLASE in the last 168 hours. No results for input(s): AMMONIA in the last 168 hours. Coagulation Profile: Recent Labs  Lab 07/31/21 0523 08/01/21 0533 08/02/21 0636 08/03/21 0816 08/04/21 0619  INR 3.3* 2.4* 2.2* 2.1* 2.4*    Cardiac Enzymes: No results for input(s): CKTOTAL, CKMB, CKMBINDEX, TROPONINI in the last 168 hours. BNP (last 3 results) No results for input(s): PROBNP in the last 8760 hours. HbA1C: No results for input(s): HGBA1C in the last 72 hours. CBG: No results for input(s): GLUCAP in the last 168 hours.  Lipid Profile: No results for input(s): CHOL, HDL, LDLCALC, TRIG, CHOLHDL, LDLDIRECT in the last 72 hours. Thyroid Function Tests: No results  for input(s): TSH, T4TOTAL, FREET4, T3FREE, THYROIDAB in the last 72 hours. Anemia Panel: No results for input(s): VITAMINB12, FOLATE, FERRITIN, TIBC, IRON, RETICCTPCT in the last 72 hours. Sepsis Labs: No results for input(s): PROCALCITON, LATICACIDVEN in the last 168 hours.   Recent Results (from the past 240 hour(s))  Culture, blood (Routine x 2)     Status: None   Collection Time: 07/25/21  4:22 PM   Specimen: BLOOD  Result Value Ref Range Status   Specimen Description BLOOD BLOOD RIGHT WRIST  Final   Special Requests   Final    BOTTLES DRAWN AEROBIC AND ANAEROBIC Blood Culture results may not be optimal due to an inadequate volume of blood received in culture bottles   Culture   Final    NO GROWTH 5 DAYS Performed at [facility], [address] [place], [place] [address]    Report Status 07/30/2021 FINAL  Final  Resp Panel by RT-PCR (Flu A&B, Covid) Nasopharyngeal Swab     Status: None   Collection Time: 07/26/21  1:44 AM   Specimen: Nasopharyngeal Swab; Nasopharyngeal(NP) swabs in vial transport medium  Result Value Ref Range Status   SARS Coronavirus 2 by RT PCR NEGATIVE NEGATIVE Final    Comment: (NOTE) SARS-CoV-2 target nucleic acids are NOT DETECTED.  The SARS-CoV-2 RNA is generally detectable in upper respiratory specimens during the acute phase of infection. The lowest concentration of SARS-CoV-2 viral copies this assay can detect is 138 copies/mL. A negative result does not preclude SARS-Cov-2 infection and should not be used as the sole basis for treatment or other patient management decisions. A negative result may occur with  improper specimen collection/handling, submission of specimen other than nasopharyngeal swab, presence of viral mutation(s) within the areas targeted by this assay, and inadequate number of viral copies(<138 copies/mL). A negative result must be combined with clinical observations, patient history, and  epidemiological information. The expected result is Negative.  Fact Sheet for Patients:  EntrepreneurPulse.com.au  Fact Sheet for Healthcare Providers:  IncredibleEmployment.be  This test is no t yet approved or cleared by the [place] FDA and  has been authorized for detection and/or diagnosis of SARS-CoV-2 by FDA under an Emergency Use Authorization (EUA). This EUA will remain  in effect (meaning this test can be used) for the duration of the COVID-19 declaration under Section 564(b)(1) of the Act, 21 U.S.C.section 360bbb-3(b)(1), unless the authorization is terminated  or revoked sooner.       Influenza A by PCR NEGATIVE NEGATIVE Final   Influenza B by PCR NEGATIVE NEGATIVE Final    Comment: (NOTE) The Xpert Xpress SARS-CoV-2/FLU/RSV plus assay is intended as an aid in the diagnosis of influenza from  Nasopharyngeal swab specimens and should not be used as a sole basis for treatment. Nasal washings and aspirates are unacceptable for Xpert Xpress SARS-CoV-2/FLU/RSV testing.  Fact Sheet for Patients: EntrepreneurPulse.com.au  Fact Sheet for Healthcare Providers: IncredibleEmployment.be  This test is not yet approved or cleared by the [place] FDA and has been authorized for detection and/or diagnosis of SARS-CoV-2 by FDA under an Emergency Use Authorization (EUA). This EUA will remain in effect (meaning this test can be used) for the duration of the COVID-19 declaration under Section 564(b)(1) of the Act, 21 U.S.C. section 360bbb-3(b)(1), unless the authorization is terminated or revoked.  Performed at [facility], [address], [place], [place] [address]   Urine Culture     Status: Abnormal   Collection Time: 07/26/21  5:12 AM   Specimen: Urine, Clean Catch  Result Value Ref Range Status   Specimen Description   Final    URINE, CLEAN CATCH Performed at [facility], [address], [place], [place] [address]    Special Requests   Final    NONE Performed at [facility], [address] [place], [place] [address]    Culture MULTIPLE SPECIES PRESENT, SUGGEST RECOLLECTION (A)  Final   Report Status 07/27/2021 FINAL  Final          Radiology Studies: No results found.      Scheduled Meds:  (feeding supplement) PROSource Plus  30 mL Oral BID BM   atorvastatin  80 mg Oral Daily   collagenase   Topical Daily   donepezil  5 mg Oral QHS   feeding supplement  237 mL Oral TID BM   ferrous sulfate  325 mg Oral Q breakfast   fluticasone furoate-vilanterol  1 puff Inhalation Daily   gabapentin  600 mg Oral BID   levothyroxine  25 mcg Oral Q0600   metoprolol succinate  12.5 mg Oral Daily   multivitamin with minerals  1 tablet Oral Daily   polyethylene glycol  17 g Oral Daily   warfarin  6 mg Oral ONCE-1600   Warfarin - Pharmacist Dosing Inpatient   Does not apply q1600   Continuous Infusions:  cefTRIAXone (ROCEPHIN)  IV 2 g (08/03/21 1725)   vancomycin        LOS: 9 days    Time spent: 35 minutes    Jodeen Mclin A Viveka Wilmeth, MD [facility]   If 7PM-7AM, please contact night-coverage [email or web address]  08/04/2021, 3:01 PM

## 2021-08-05 DIAGNOSIS — A419 Sepsis, unspecified organism: Secondary | ICD-10-CM | POA: Diagnosis not present

## 2021-08-05 LAB — BASIC METABOLIC PANEL
Anion gap: 4 — ABNORMAL LOW (ref 5–15)
BUN: 14 mg/dL (ref 8–23)
CO2: 25 mmol/L (ref 22–32)
Calcium: 7.8 mg/dL — ABNORMAL LOW (ref 8.9–10.3)
Chloride: 107 mmol/L (ref 98–111)
Creatinine, Ser: 0.61 mg/dL (ref 0.44–1.00)
GFR, Estimated: >60 mL/min (ref >60)
Glucose, Bld: 90 mg/dL (ref 70–99)
Potassium: 3.8 mmol/L (ref 3.5–5.1)
Sodium: 136 mmol/L (ref 135–145)

## 2021-08-05 LAB — CBC
HCT: 25 % — ABNORMAL LOW (ref 36.0–46.0)
Hemoglobin: 7.9 g/dL — ABNORMAL LOW (ref 12.0–15.0)
MCH: 29.4 pg (ref 26.0–34.0)
MCHC: 31.6 g/dL (ref 30.0–36.0)
MCV: 92.9 fL (ref 80.0–100.0)
Platelets: 186 10*3/uL (ref 150–400)
RBC: 2.69 MIL/uL — ABNORMAL LOW (ref 3.87–5.11)
RDW: 19.9 % — ABNORMAL HIGH (ref 11.5–15.5)
WBC: 9 10*3/uL (ref 4.0–10.5)
nRBC: 0.2 % (ref 0.0–0.2)

## 2021-08-05 LAB — MAGNESIUM: Magnesium: 2.6 mg/dL — ABNORMAL HIGH (ref 1.7–2.4)

## 2021-08-05 LAB — PROTIME-INR
INR: 2.8 — ABNORMAL HIGH (ref 0.8–1.2)
Prothrombin Time: 29.2 seconds — ABNORMAL HIGH (ref 11.4–15.2)

## 2021-08-05 MED ORDER — WARFARIN SODIUM 5 MG PO TABS
5.0000 mg | ORAL_TABLET | Freq: Once | ORAL | Status: AC
  Administered 2021-08-05: 15:00:00 5 mg via ORAL
  Filled 2021-08-05: qty 1

## 2021-08-05 MED ORDER — OXYCODONE HCL 5 MG PO TABS
5.0000 mg | ORAL_TABLET | Freq: Four times a day (QID) | ORAL | Status: DC | PRN
  Administered 2021-08-05 – 2021-08-16 (×8): 5 mg via ORAL
  Filled 2021-08-05 (×8): qty 1

## 2021-08-05 MED ORDER — OXYCODONE HCL ER 10 MG PO T12A
10.0000 mg | EXTENDED_RELEASE_TABLET | Freq: Two times a day (BID) | ORAL | Status: DC | PRN
  Administered 2021-08-05 – 2021-08-07 (×2): 10 mg via ORAL
  Filled 2021-08-05 (×2): qty 1

## 2021-08-05 NOTE — Progress Notes (Signed)
PROGRESS NOTE    Rhonda Saunders  YWV:[id number] DOB: 06-04-1932 DOA: 07/26/2021 PCP: Adin Hector, MD   Brief Narrative: 88 year old with past medical history significant for hypertension, CAD, diastolic heart failure, aortic valve replacement on Coumadin, asthma, diabetes, dementia, chronic pain, ischial decubitus ulcer, chronic venous stasis ulcer lower extremity on Unna boot, who presents to the ED complaining of weakness, full odor from wounds, concern for UTI.  Patient was also noted to be having altered mental status.    Assessment & Plan:   Principal Problem:   Sepsis ([place]) Active Problems:   Coronary artery disease   Chronic diastolic CHF (congestive heart failure) (HCC)   Essential hypertension   Hx of aortic valve replacement, mechanical   Anticoagulated on warfarin   Senile dementia without behavioral disturbance (HCC)   Type 2 diabetes, diet controlled (HCC)   Chronic pain syndrome   Lower limb ulcer, ankle, right, with fat layer exposed ([place])   Decubitus ulcer, infected, unspecified pressure ulcer stage   Cellulitis   COPD (chronic obstructive pulmonary disease) (HCC)   Stage 3a chronic kidney disease (HCC)   Supratherapeutic INR   Acute metabolic encephalopathy   Functional quadriplegia (HCC)   1-Decubitus ulcer buttock, unspecified pressure ulcer stage, cellulitis of buttock: -Patient presented with leukocytosis and lactic acidosis, no fevers or tachycardia. -Continue with IV vancomycin  change cefepime to ceftriaxone. Day 8/10 antibiotics  -Wound care consulted. surgery consulted. -Continue with wound care.   2-Right heel unstageable pressure ulcer, Right lower leg venous stasis unstageable ulcer Surgery  recommend amputation. Palliative care consulted for goals of care. Family has decide not to proceed with surgery.  Plan to transfer to SNF for long term care and palliative care follow up/.   Acute metabolic encephalopathy: Patient presented with altered  mental status and lethargy. Probably related to medications, opioid and gabapentin OxyContin reduced to 10 mg twice daily as needed Gabapentin reduced to home dose Alert, follows command.   History of aortic valve replacement, mechanical Supra therapeutic INR Coumadin per pharmacy INR; 2.0 --2.2--2.4  Acute on chronic anemia, from blood loss Related to multiple blood drawn, bleeding from skin tears Hb down to 6.9. received 1 unit of packed red blood cell 1/11 Started  oral iron. Hb stable.    Skin tear of her left hand bleeding Exacerbated by swelling and supratherapeutic INR Applied Surgicel and compression dressing with Ace.  Functional Quadriplegia, obesity: need long term care.  Chronic diastolic heart failure: Appears euvolemic CAD: Continue with statin Hypertension: Continue with Toprol Senile dementia without behavioral disturbance continue with Aricept Diabetes type 2: A1c 5.8. SSI Chronic Pain syndrome neuropathic pain Continue OxyContin 10 mg twice daily as needed. Complaints of pain, will add low dose Oxycodone PRN.  COPD: Continue with albuterol as needed Stage IIIa CKD: Monitor renal function Sepsis was ruled out.  Pressure Injury Stage II -  Partial thickness loss of dermis presenting as a shallow open ulcer with a red, pink wound bed without slough. Patient have small stage 2 (Active)     Location: Buttocks  Location Orientation: Left  Staging: Stage II -  Partial thickness loss of dermis presenting as a shallow open ulcer with a red, pink wound bed without slough.  Wound Description (Comments): Patient have small stage 2  Present on Admission: Yes     Pressure Injury 07/27/21 Heel Right Stage 3 -  Full thickness tissue loss. Subcutaneous fat may be visible but bone, tendon or muscle are NOT exposed. (  Active)  07/27/21 0612  Location: Heel  Location Orientation: Right  Staging: Stage 3 -  Full thickness tissue loss. Subcutaneous fat may be visible but  bone, tendon or muscle are NOT exposed.  Wound Description (Comments):   Present on Admission:      Pressure Injury 07/27/21 Buttocks Bilateral Deep Tissue Pressure Injury - Purple or maroon localized area of discolored intact skin or blood-filled blister due to damage of underlying soft tissue from pressure and/or shear. (Active)  07/27/21 0614  Location: Buttocks  Location Orientation: Bilateral  Staging: Deep Tissue Pressure Injury - Purple or maroon localized area of discolored intact skin or blood-filled blister due to damage of underlying soft tissue from pressure and/or shear.  Wound Description (Comments):   Present on Admission:      Nutrition Problem: Increased nutrient needs Etiology: wound healing    Signs/Symptoms: estimated needs    Interventions: MVI, Juven, Premier Protein  Estimated body mass index is 33.83 kg/m as calculated from the following:   Height as of 07/10/21: 5\' 2"  (1.575 m).   Weight as of this encounter: 83.9 kg.   DVT prophylaxis: Coumadin  Code Status: DNR Family Communication: Son over phone 1/13 Disposition Plan:  Status is: Inpatient  Remains inpatient appropriate because: anemia, multiples wound. She will need SNF for long term care and Palliative care follow up.         Consultants:  General surgery Palliative   Procedures:    Antimicrobials:    Subjective: She is alert, complaints of pain. She is not due for pain meds. I have started low dose oxycodone.   Objective: Vitals:   08/04/21 2007 08/05/21 0435 08/05/21 0812 08/05/21 1141  BP: 132/65 (!) 128/56 (!) 146/54 138/86  Pulse: 63 73 69 71  Resp: 16 15 18 20   Temp: 98 F (36.7 C) 98.3 F (36.8 C) 98 F (36.7 C) 97.6 F (36.4 C)  TempSrc: Oral Oral Oral Oral  SpO2: 100% 98% 95% 99%  Weight:        Intake/Output Summary (Last 24 hours) at 08/05/2021 1226 Last data filed at 08/05/2021 1219 Gross per 24 hour  Intake 400.07 ml  Output 200 ml  Net 200.07 ml     Filed Weights   07/26/21 0301  Weight: 83.9 kg    Examination:  General exam: Chronic ill appearing Respiratory system: CTA Cardiovascular system: S 1, S 2 RRR mechanical valve click.  Gastrointestinal system: BS present, soft, nt Central nervous system: alert, follows command, weak.  Extremities: large right heel ulcers and necrotic tissue. Right LE calf big open ulcer wound    Data Reviewed: I have personally reviewed following labs and imaging studies  CBC: Recent Labs  Lab 08/01/21 0533 08/02/21 0636 08/03/21 0816 08/04/21 0619 08/05/21 0433  WBC 11.4* 9.4 9.8 9.7 9.0  HGB 6.8* 7.8* 7.9* 8.1* 7.9*  HCT 21.3* 24.1* 24.3* 25.0* 25.0*  MCV 91.4 90.3 90.3 91.2 92.9  PLT 186 173 175 181 333    Basic Metabolic Panel: Recent Labs  Lab 08/01/21 0533 08/02/21 0636 08/03/21 0816 08/04/21 0619 08/05/21 0433  NA 136 137 135 137 136  K 3.8 3.6 3.6 3.7 3.8  CL 106 105 104 103 107  CO2 27 28 25 27 25   GLUCOSE 106* 96 89 99 90  BUN 16 17 14 13 14   CREATININE 1.01* 0.87 0.70 0.72 0.61  CALCIUM 7.3* 7.7* 7.6* 7.9* 7.8*  MG 2.3 2.5* 2.4 2.5* 2.6*    GFR: Estimated Creatinine  Clearance: 49.7 mL/min (by C-G formula based on SCr of 0.61 mg/dL). Liver Function Tests: No results for input(s): AST, ALT, ALKPHOS, BILITOT, PROT, ALBUMIN in the last 168 hours. No results for input(s): LIPASE, AMYLASE in the last 168 hours. No results for input(s): AMMONIA in the last 168 hours. Coagulation Profile: Recent Labs  Lab 08/01/21 0533 08/02/21 0636 08/03/21 0816 08/04/21 0619 08/05/21 0433  INR 2.4* 2.2* 2.1* 2.4* 2.8*    Cardiac Enzymes: No results for input(s): CKTOTAL, CKMB, CKMBINDEX, TROPONINI in the last 168 hours. BNP (last 3 results) No results for input(s): PROBNP in the last 8760 hours. HbA1C: No results for input(s): HGBA1C in the last 72 hours. CBG: No results for input(s): GLUCAP in the last 168 hours.  Lipid Profile: No results for input(s): CHOL,  HDL, LDLCALC, TRIG, CHOLHDL, LDLDIRECT in the last 72 hours. Thyroid Function Tests: No results for input(s): TSH, T4TOTAL, FREET4, T3FREE, THYROIDAB in the last 72 hours. Anemia Panel: No results for input(s): VITAMINB12, FOLATE, FERRITIN, TIBC, IRON, RETICCTPCT in the last 72 hours. Sepsis Labs: No results for input(s): PROCALCITON, LATICACIDVEN in the last 168 hours.   No results found for this or any previous visit (from the past 240 hour(s)).         Radiology Studies: No results found.      Scheduled Meds:  (feeding supplement) PROSource Plus  30 mL Oral BID BM   atorvastatin  80 mg Oral Daily   collagenase   Topical Daily   donepezil  5 mg Oral QHS   feeding supplement  237 mL Oral TID BM   ferrous sulfate  325 mg Oral Q breakfast   fluticasone furoate-vilanterol  1 puff Inhalation Daily   gabapentin  600 mg Oral BID   levothyroxine  25 mcg Oral Q0600   metoprolol succinate  12.5 mg Oral Daily   multivitamin with minerals  1 tablet Oral Daily   polyethylene glycol  17 g Oral Daily   warfarin  5 mg Oral ONCE-1600   Warfarin - Pharmacist Dosing Inpatient   Does not apply q1600   Continuous Infusions:  cefTRIAXone (ROCEPHIN)  IV Stopped (08/04/21 1733)   vancomycin 200 mL/hr at 08/04/21 1747      LOS: 10 days    Time spent: 35 minutes    Florabel Faulks A Vanshika Jastrzebski, MD [facility]   If 7PM-7AM, please contact night-coverage [email or web address]  08/05/2021, 12:26 PM

## 2021-08-05 NOTE — Progress Notes (Signed)
ANTICOAGULATION CONSULT NOTE  Pharmacy Consult for warfarin Indication:  mechanical valve  Patient Measurements: Weight: 83.9 kg (184 lb 15.5 oz)  Vital Signs: Temp: 97.6 F (36.4 C) (01/15 1141) Temp Source: Oral (01/15 1141) BP: 138/86 (01/15 1141) Pulse Rate: 71 (01/15 1141)  Labs: Recent Labs    08/03/21 0816 08/04/21 0619 08/05/21 0433  HGB 7.9* 8.1* 7.9*  HCT 24.3* 25.0* 25.0*  PLT 175 181 186  LABPROT 23.3* 25.8* 29.2*  INR 2.1* 2.4* 2.8*  CREATININE 0.70 0.72 0.61     Estimated Creatinine Clearance: 49.7 mL/min (by C-G formula based on SCr of 0.61 mg/dL).   Medical History: Past Medical History:  Diagnosis Date   CHF (congestive heart failure) (HCC)    COPD (chronic obstructive pulmonary disease) (HCC)    Coronary artery disease    Dementia (HCC)    Diabetes mellitus without complication ([place])    Hyperlipidemia    Hypertension     Assessment: 88 year old female with altered mental status. Patient presented with INR 3.6 that has since trended up. Patient on warfarin for mechanical valve. Per review of Care Everywhere, it looks like she has had several supratherapeutic INRs in the past month.  Home warfarin regimen: 5 mg daily  Date INR Warfarin Dose  1/6 5.9 HELD  1/7 5.5 HELD  1/8 5.9 HELD  1/9 4.9 HELD  1/10 3.3 5 mg  1/11 2.4 6 mg  1/12 2.2 6 mg  1/13 2.1 7.5 mg   1/14 2.4 6 mg  1/15 2.8        DDI: ceftriaxone/Vancomycin  Goal of Therapy:  INR 2.5-3.5 Monitor platelets by anticoagulation protocol: Yes   Plan:  INR subtherapeutic but now trending up. Will order warfarin 5 mg for this evening. Family has decided to defer surgical intervention.  Hgb  7.9 >> 8.1 >>7.9 Daily INR ordered. CBC at least every 3 days.  Noralee Space, PharmD Clinical Pharmacist 08/05/2021 11:44 AM

## 2021-08-06 DIAGNOSIS — Z7901 Long term (current) use of anticoagulants: Secondary | ICD-10-CM | POA: Diagnosis not present

## 2021-08-06 DIAGNOSIS — L03317 Cellulitis of buttock: Secondary | ICD-10-CM | POA: Diagnosis not present

## 2021-08-06 DIAGNOSIS — G9341 Metabolic encephalopathy: Secondary | ICD-10-CM | POA: Diagnosis not present

## 2021-08-06 LAB — PROTIME-INR
INR: 2.9 — ABNORMAL HIGH (ref 0.8–1.2)
Prothrombin Time: 30.1 seconds — ABNORMAL HIGH (ref 11.4–15.2)

## 2021-08-06 LAB — CREATININE, SERUM
Creatinine, Ser: 0.81 mg/dL (ref 0.44–1.00)
GFR, Estimated: >60 mL/min (ref >60)

## 2021-08-06 MED ORDER — WARFARIN SODIUM 5 MG PO TABS
5.0000 mg | ORAL_TABLET | Freq: Once | ORAL | Status: AC
  Administered 2021-08-06: 16:00:00 5 mg via ORAL
  Filled 2021-08-06: qty 1

## 2021-08-06 NOTE — Care Management Important Message (Signed)
Important Message  Patient Details  Name: Rhonda Saunders MRN: [id number] Date of Birth: 1931-09-08   Medicare Important Message Given:  Yes     Dannette Barbara 08/06/2021, 10:30 AM

## 2021-08-06 NOTE — Progress Notes (Signed)
ANTICOAGULATION CONSULT NOTE  Pharmacy Consult for warfarin Indication:  mechanical valve  Patient Measurements: Weight: 83.9 kg (184 lb 15.5 oz)  Vital Signs: Temp: 98.6 F (37 C) (01/15 1959) Temp Source: Oral (01/15 1959) BP: 124/44 (01/15 1959) Pulse Rate: 69 (01/15 1959)  Labs: Recent Labs    08/03/21 0816 08/04/21 0619 08/05/21 0433 08/06/21 0616  HGB 7.9* 8.1* 7.9*  --   HCT 24.3* 25.0* 25.0*  --   PLT 175 181 186  --   LABPROT 23.3* 25.8* 29.2* 30.1*  INR 2.1* 2.4* 2.8* 2.9*  CREATININE 0.70 0.72 0.61 0.81     Estimated Creatinine Clearance: 49.1 mL/min (by C-G formula based on SCr of 0.81 mg/dL).   Medical History: Past Medical History:  Diagnosis Date   CHF (congestive heart failure) (HCC)    COPD (chronic obstructive pulmonary disease) (HCC)    Coronary artery disease    Dementia (HCC)    Diabetes mellitus without complication ([place])    Hyperlipidemia    Hypertension     Assessment: 88 year old female with altered mental status. Patient presented with INR 3.6 that has since trended up. Patient on warfarin for mechanical valve. Per review of Care Everywhere, it looks like she has had several supratherapeutic INRs in the past month.  Home warfarin regimen: 5 mg daily  Date INR Warfarin Dose  1/6 5.9 HELD  1/7 5.5 HELD  1/8 5.9 HELD  1/9 4.9 HELD  1/10 3.3 5 mg  1/11 2.4 6 mg  1/12 2.2 6 mg  1/13 2.1 7.5 mg   1/14 2.4 6 mg  1/15 2.8 5 mg  1/16 2.9 5 mg    Goal of Therapy:  INR 2.5-3.5 Monitor platelets by anticoagulation protocol: Yes   Plan:  INR therapeutic. Will order warfarin 5 mg for this evening.  Hgb stable at 7.9 Daily INR ordered. CBC at least every 3 days.  Darnelle Bos, PharmD Clinical Pharmacist 08/06/2021 7:24 AM

## 2021-08-06 NOTE — NC FL2 (Signed)
[place] LEVEL OF CARE SCREENING TOOL     IDENTIFICATION  Patient Name: Rhonda Saunders Birthdate: 1931-09-16 Sex: female Admission Date (Current Location): 07/26/2021  [place] and [place] Number:  Engineering geologist and Address:  [facility], [address] [place], [place] [address]      Provider Number: [id number]  Attending Physician Name and Address:  Elmarie Shiley, MD  Relative Name and Phone Number:  Clotee, Schlicker (Son)   [phone]    Current Level of Care:   Recommended Level of Care: Other (Comment) ([place]) Prior Approval Number:    Date Approved/Denied:   PASRR Number:    Discharge Plan: Other (Comment) (Long Term Care)    Current Diagnoses: Patient Active Problem List   Diagnosis Date Noted   Cellulitis of multiple sites of buttock    Sepsis ([place]) 07/26/2021   Decubitus ulcer, infected, unspecified pressure ulcer stage 07/26/2021   Cellulitis 07/26/2021   Functional quadriplegia ([place]) 07/26/2021   COPD (chronic obstructive pulmonary disease) (HCC)    Supratherapeutic INR    Acute metabolic encephalopathy    Stage 3a chronic kidney disease ([place]) 03/23/2021   Lower limb ulcer, ankle, right, with fat layer exposed ([place]) 02/15/2020   Decubitus ulcer of sacral region, stage 2 ([place]) 02/10/2020   Lymphedema 07/13/2019   Chronic thoracic back pain (Right) 08/06/2018   Postherpetic neuralgia (Thoracic) (Right) 08/06/2018   Thoracic radicular pain (Right) 08/06/2018   Thoracic radiculitis (Right) 08/06/2018   Elevated C-reactive protein (CRP) 08/03/2018   Elevated sed rate 08/03/2018   Chronic upper back pain (Primary Area of Pain) (Right) 07/27/2018   Neurogenic pain 07/27/2018   Chronic lower extremity pain (Tertiary Area of Pain) (Left) 07/27/2018   Chronic pain syndrome 07/27/2018   Long term current use of opiate analgesic 07/27/2018   Pharmacologic therapy 07/27/2018   Disorder of skeletal system  07/27/2018   Problems influencing health status 07/27/2018   Pressure ulcer of ischium, stage 2, unspecified laterality ([place]) 05/16/2018   Altered mental status    Palliative care by specialist    Goals of care, counseling/discussion    AKI (acute kidney injury) ([place]) 05/13/2018   Pituitary tumor 02/20/2018   Senile dementia without behavioral disturbance ([place]) 01/29/2018   Moderate persistent asthma without complication 40/04/2724   Pure hypercholesterolemia 12/17/2016   BMI 35.0-35.9,adult 01/04/2016   Morbid obesity ([place]) 01/04/2016   Chronic low back pain (Bilateral) (R>L) w/o sciatica 10/06/2015   Type 2 diabetes, diet controlled ([place]) 10/06/2015   Chronic diastolic CHF (congestive heart failure) ([place]) 04/21/2014   Anticoagulated on warfarin 04/22/2012   Coronary artery disease 11/26/2011   Essential hypertension 11/26/2011   Hearing loss of right ear 11/26/2011   Hx of aortic valve replacement, mechanical 11/26/2011   Hyperlipidemia 11/26/2011    Orientation RESPIRATION BLADDER Height & Weight        Normal External catheter Weight: 83.9 kg Height:     BEHAVIORAL SYMPTOMS/MOOD NEUROLOGICAL BOWEL NUTRITION STATUS        Diet  AMBULATORY STATUS COMMUNICATION OF NEEDS Skin   Extensive Assist   Other (Comment) (Bilateral LE and Buttocks)                       Personal Care Assistance Level of Assistance  Total care       Total Care Assistance: Maximum assistance   Functional Limitations Info  Sight, Hearing, Speech Sight Info: Impaired Hearing Info: Impaired Speech  Info: Adequate    SPECIAL CARE FACTORS FREQUENCY                       Contractures Contractures Info: Not present    Additional Factors Info  Code Status, Allergies Code Status Info: DNR Allergies Info: Pentazocine           Current Medications (08/06/2021):  This is the current hospital active medication list Current Facility-Administered Medications  Medication Dose Route  Frequency Provider Last Rate Last Admin   (feeding supplement) PROSource Plus liquid 30 mL  30 mL Oral BID BM Enzo Bi, MD   30 mL at 08/06/21 1600   acetaminophen (TYLENOL) tablet 650 mg  650 mg Oral Q6H PRN Athena Masse, MD   650 mg at 08/05/21 8546   Or   acetaminophen (TYLENOL) suppository 650 mg  650 mg Rectal Q6H PRN Athena Masse, MD       albuterol (PROVENTIL) (2.5 MG/3ML) 0.083% nebulizer solution 3 mL  3 mL Nebulization Q6H PRN Enzo Bi, MD       alum & mag hydroxide-simeth (MAALOX/MYLANTA) 200-200-20 MG/5ML suspension 15 mL  15 mL Oral Q6H PRN Enzo Bi, MD   15 mL at 07/30/21 1845   atorvastatin (LIPITOR) tablet 80 mg  80 mg Oral Daily Enzo Bi, MD   80 mg at 08/06/21 0936   calcium carbonate (TUMS - dosed in mg elemental calcium) chewable tablet 200 mg of elemental calcium  1 tablet Oral TID PRN Enzo Bi, MD   200 mg of elemental calcium at 07/30/21 1953   collagenase (SANTYL) ointment   Topical Daily Enzo Bi, MD   1 application at 27/03/50 0938   donepezil (ARICEPT) tablet 5 mg  5 mg Oral QHS Enzo Bi, MD   5 mg at 08/05/21 2006   feeding supplement (ENSURE ENLIVE / ENSURE PLUS) liquid 237 mL  237 mL Oral TID BM Enzo Bi, MD   237 mL at 08/06/21 1601   ferrous sulfate tablet 325 mg  325 mg Oral Q breakfast Regalado, Belkys A, MD   325 mg at 08/06/21 0936   fluticasone furoate-vilanterol (BREO ELLIPTA) 200-25 MCG/ACT 1 puff  1 puff Inhalation Daily Enzo Bi, MD   1 puff at 08/06/21 0939   gabapentin (NEURONTIN) tablet 600 mg  600 mg Oral BID Regalado, Belkys A, MD   600 mg at 08/06/21 0936   levothyroxine (SYNTHROID) tablet 25 mcg  25 mcg Oral Q0600 Enzo Bi, MD   25 mcg at 08/06/21 0507   metoprolol succinate (TOPROL-XL) 24 hr tablet 12.5 mg  12.5 mg Oral Daily Enzo Bi, MD   12.5 mg at 08/06/21 0935   multivitamin with minerals tablet 1 tablet  1 tablet Oral Daily Enzo Bi, MD   1 tablet at 08/06/21 0936   ondansetron (ZOFRAN) tablet 4 mg  4 mg Oral Q6H PRN Athena Masse, MD       Or   ondansetron [facility]) injection 4 mg  4 mg Intravenous Q6H PRN Athena Masse, MD       oxyCODONE (Oxy IR/ROXICODONE) immediate release tablet 5 mg  5 mg Oral Q6H PRN Regalado, Belkys A, MD   5 mg at 08/05/21 1035   oxyCODONE (OXYCONTIN) 12 hr tablet 10 mg  10 mg Oral Q12H PRN Regalado, Belkys A, MD   10 mg at 08/05/21 1505   polyethylene glycol (MIRALAX / GLYCOLAX) packet 17 g  17 g Oral Daily Enzo Bi,  MD   17 g at 08/06/21 0935   Warfarin - Pharmacist Dosing Inpatient   Does not apply [id number] Tawnya Crook, [facility]   Given at 08/04/21 1709     Discharge Medications: Please see discharge summary for a list of discharge medications.  Relevant Imaging Results:  Relevant Lab Results:   Additional Information [phone]  Kerin Salen, RN

## 2021-08-06 NOTE — Plan of Care (Signed)

## 2021-08-06 NOTE — Progress Notes (Signed)
PROGRESS NOTE    Rhonda Saunders  NWG:[id number] DOB: 03-Sep-1931 DOA: 07/26/2021 PCP: Adin Hector, MD   Brief Narrative: 88 year old with past medical history significant for hypertension, CAD, diastolic heart failure, aortic valve replacement on Coumadin, asthma, diabetes, dementia, chronic pain, ischial decubitus ulcer, chronic venous stasis ulcer lower extremity on Unna boot, who presents to the ED complaining of weakness, full odor from wounds, concern for UTI.  Patient was also noted to be having altered mental status.    Assessment & Plan:   Principal Problem:   Sepsis ([place]) Active Problems:   Coronary artery disease   Chronic diastolic CHF (congestive heart failure) (HCC)   Essential hypertension   Hx of aortic valve replacement, mechanical   Anticoagulated on warfarin   Senile dementia without behavioral disturbance (HCC)   Type 2 diabetes, diet controlled (HCC)   Chronic pain syndrome   Lower limb ulcer, ankle, right, with fat layer exposed ([place])   Decubitus ulcer, infected, unspecified pressure ulcer stage   Cellulitis   COPD (chronic obstructive pulmonary disease) (HCC)   Stage 3a chronic kidney disease (HCC)   Supratherapeutic INR   Acute metabolic encephalopathy   Functional quadriplegia (HCC)   1-Decubitus ulcer buttock, unspecified pressure ulcer stage, cellulitis of buttock: -Patient presented with leukocytosis and lactic acidosis, no fevers or tachycardia. -Continue with IV vancomycin  change cefepime to ceftriaxone. Completed 10 days, discussed with pharmacist.  -Wound care consulted. surgery consulted. -Continue with wound care.   2-Right heel unstageable pressure ulcer, Right lower leg venous stasis unstageable ulcer Surgery  recommend amputation. Palliative care consulted for goals of care. Family has decide not to proceed with surgery.  Plan to transfer to SNF for long term care and palliative care follow up/.   Acute metabolic encephalopathy:  Patient presented with altered mental status and lethargy. Probably related to medications, opioid and gabapentin OxyContin reduced to 10 mg twice daily as needed Gabapentin reduced to home dose Alert, follows command.   History of aortic valve replacement, mechanical Supra therapeutic INR Coumadin per pharmacy INR; 2.0 --2.2--2.4--2.9  Acute on chronic anemia, from blood loss Related to multiple blood drawn, bleeding from skin tears Hb down to 6.9. received 1 unit of packed red blood cell 1/11 Started  oral iron. Hb stable.    Skin tear of her left hand bleeding Exacerbated by swelling and supratherapeutic INR Applied Surgicel and compression dressing with Ace.  Functional Quadriplegia, obesity: need long term care.  Chronic diastolic heart failure: Appears euvolemic CAD: Continue with statin Hypertension: Continue with Toprol Senile dementia without behavioral disturbance continue with Aricept Diabetes type 2: A1c 5.8. SSI Chronic Pain syndrome neuropathic pain Continue OxyContin 10 mg twice daily as needed. Complaints of pain, will add low dose Oxycodone PRN.  COPD: Continue with albuterol as needed Stage IIIa CKD: Monitor renal function Sepsis was ruled out.  Pressure Injury Stage II -  Partial thickness loss of dermis presenting as a shallow open ulcer with a red, pink wound bed without slough. Patient have small stage 2 (Active)     Location: Buttocks  Location Orientation: Left  Staging: Stage II -  Partial thickness loss of dermis presenting as a shallow open ulcer with a red, pink wound bed without slough.  Wound Description (Comments): Patient have small stage 2  Present on Admission: Yes     Pressure Injury 07/27/21 Heel Right Stage 3 -  Full thickness tissue loss. Subcutaneous fat may be visible but bone, tendon or muscle  are NOT exposed. (Active)  07/27/21 0612  Location: Heel  Location Orientation: Right  Staging: Stage 3 -  Full thickness tissue loss.  Subcutaneous fat may be visible but bone, tendon or muscle are NOT exposed.  Wound Description (Comments):   Present on Admission:      Pressure Injury 07/27/21 Buttocks Bilateral Deep Tissue Pressure Injury - Purple or maroon localized area of discolored intact skin or blood-filled blister due to damage of underlying soft tissue from pressure and/or shear. (Active)  07/27/21 0614  Location: Buttocks  Location Orientation: Bilateral  Staging: Deep Tissue Pressure Injury - Purple or maroon localized area of discolored intact skin or blood-filled blister due to damage of underlying soft tissue from pressure and/or shear.  Wound Description (Comments):   Present on Admission:      Nutrition Problem: Increased nutrient needs Etiology: wound healing    Signs/Symptoms: estimated needs    Interventions: MVI, Juven, Premier Protein  Estimated body mass index is 33.83 kg/m as calculated from the following:   Height as of 07/10/21: 5\' 2"  (1.575 m).   Weight as of this encounter: 83.9 kg.   DVT prophylaxis: Coumadin  Code Status: DNR Family Communication: Son over phone 1/13 Disposition Plan:  Status is: Inpatient  Remains inpatient appropriate because: anemia, multiples wound. She will need SNF for long term care and Palliative care follow up. Awaiting Placement.         Consultants:  General surgery Palliative   Procedures:    Antimicrobials:    Subjective: She is sleepy, wake up to voice, report pain is better controlled.   Objective: Vitals:   08/05/21 1141 08/05/21 1600 08/05/21 1959 08/06/21 0906  BP: 138/86 124/81 (!) 124/44 (!) 118/44  Pulse: 71 74 69 74  Resp: 20 18 19 18   Temp: 97.6 F (36.4 C) 98 F (36.7 C) 98.6 F (37 C) 97.8 F (36.6 C)  TempSrc: Oral Oral Oral   SpO2: 99% 97% 98% 99%  Weight:        Intake/Output Summary (Last 24 hours) at 08/06/2021 1320 Last data filed at 08/06/2021 0543 Gross per 24 hour  Intake 287.56 ml  Output --   Net 287.56 ml    Filed Weights   07/26/21 0301  Weight: 83.9 kg    Examination:  General exam: Chronic ill appearing Respiratory system: CTA Cardiovascular system: S 1, S 2 RRR mechanical valve click.  Gastrointestinal system: BS present, soft, nt Central nervous system: sleepy, wake up to voice, no complaints   Extremities: large right heel ulcers and necrotic tissue. Right LE calf big open ulcer wound    Data Reviewed: I have personally reviewed following labs and imaging studies  CBC: Recent Labs  Lab 08/01/21 0533 08/02/21 0636 08/03/21 0816 08/04/21 0619 08/05/21 0433  WBC 11.4* 9.4 9.8 9.7 9.0  HGB 6.8* 7.8* 7.9* 8.1* 7.9*  HCT 21.3* 24.1* 24.3* 25.0* 25.0*  MCV 91.4 90.3 90.3 91.2 92.9  PLT 186 173 175 181 623    Basic Metabolic Panel: Recent Labs  Lab 08/01/21 0533 08/02/21 0636 08/03/21 0816 08/04/21 0619 08/05/21 0433 08/06/21 0616  NA 136 137 135 137 136  --   K 3.8 3.6 3.6 3.7 3.8  --   CL 106 105 104 103 107  --   CO2 27 28 25 27 25   --   GLUCOSE 106* 96 89 99 90  --   BUN 16 17 14 13 14   --   CREATININE 1.01* 0.87 0.70 0.72  0.61 0.81  CALCIUM 7.3* 7.7* 7.6* 7.9* 7.8*  --   MG 2.3 2.5* 2.4 2.5* 2.6*  --     GFR: Estimated Creatinine Clearance: 49.1 mL/min (by C-G formula based on SCr of 0.81 mg/dL). Liver Function Tests: No results for input(s): AST, ALT, ALKPHOS, BILITOT, PROT, ALBUMIN in the last 168 hours. No results for input(s): LIPASE, AMYLASE in the last 168 hours. No results for input(s): AMMONIA in the last 168 hours. Coagulation Profile: Recent Labs  Lab 08/02/21 0636 08/03/21 0816 08/04/21 0619 08/05/21 0433 08/06/21 0616  INR 2.2* 2.1* 2.4* 2.8* 2.9*    Cardiac Enzymes: No results for input(s): CKTOTAL, CKMB, CKMBINDEX, TROPONINI in the last 168 hours. BNP (last 3 results) No results for input(s): PROBNP in the last 8760 hours. HbA1C: No results for input(s): HGBA1C in the last 72 hours. CBG: No results for  input(s): GLUCAP in the last 168 hours.  Lipid Profile: No results for input(s): CHOL, HDL, LDLCALC, TRIG, CHOLHDL, LDLDIRECT in the last 72 hours. Thyroid Function Tests: No results for input(s): TSH, T4TOTAL, FREET4, T3FREE, THYROIDAB in the last 72 hours. Anemia Panel: No results for input(s): VITAMINB12, FOLATE, FERRITIN, TIBC, IRON, RETICCTPCT in the last 72 hours. Sepsis Labs: No results for input(s): PROCALCITON, LATICACIDVEN in the last 168 hours.   No results found for this or any previous visit (from the past 240 hour(s)).         Radiology Studies: No results found.      Scheduled Meds:  (feeding supplement) PROSource Plus  30 mL Oral BID BM   atorvastatin  80 mg Oral Daily   collagenase   Topical Daily   donepezil  5 mg Oral QHS   feeding supplement  237 mL Oral TID BM   ferrous sulfate  325 mg Oral Q breakfast   fluticasone furoate-vilanterol  1 puff Inhalation Daily   gabapentin  600 mg Oral BID   levothyroxine  25 mcg Oral Q0600   metoprolol succinate  12.5 mg Oral Daily   multivitamin with minerals  1 tablet Oral Daily   polyethylene glycol  17 g Oral Daily   warfarin  5 mg Oral ONCE-1600   Warfarin - Pharmacist Dosing Inpatient   Does not apply q1600   Continuous Infusions:      LOS: 11 days    Time spent: 35 minutes    Chosen Garron A Connar Keating, MD [facility]   If 7PM-7AM, please contact night-coverage [email or web address]  08/06/2021, 1:20 PM

## 2021-08-07 DIAGNOSIS — A419 Sepsis, unspecified organism: Secondary | ICD-10-CM | POA: Diagnosis not present

## 2021-08-07 DIAGNOSIS — Z7901 Long term (current) use of anticoagulants: Secondary | ICD-10-CM | POA: Diagnosis not present

## 2021-08-07 DIAGNOSIS — G9341 Metabolic encephalopathy: Secondary | ICD-10-CM | POA: Diagnosis not present

## 2021-08-07 DIAGNOSIS — L03317 Cellulitis of buttock: Secondary | ICD-10-CM | POA: Diagnosis not present

## 2021-08-07 LAB — CBC
HCT: 25.8 % — ABNORMAL LOW (ref 36.0–46.0)
Hemoglobin: 8 g/dL — ABNORMAL LOW (ref 12.0–15.0)
MCH: 29 pg (ref 26.0–34.0)
MCHC: 31 g/dL (ref 30.0–36.0)
MCV: 93.5 fL (ref 80.0–100.0)
Platelets: 154 10*3/uL (ref 150–400)
RBC: 2.76 MIL/uL — ABNORMAL LOW (ref 3.87–5.11)
RDW: 20.3 % — ABNORMAL HIGH (ref 11.5–15.5)
WBC: 7.5 10*3/uL (ref 4.0–10.5)
nRBC: 0 % (ref 0.0–0.2)

## 2021-08-07 LAB — BASIC METABOLIC PANEL
Anion gap: 3 — ABNORMAL LOW (ref 5–15)
BUN: 14 mg/dL (ref 8–23)
CO2: 27 mmol/L (ref 22–32)
Calcium: 8 mg/dL — ABNORMAL LOW (ref 8.9–10.3)
Chloride: 108 mmol/L (ref 98–111)
Creatinine, Ser: 0.66 mg/dL (ref 0.44–1.00)
GFR, Estimated: >60 mL/min (ref >60)
Glucose, Bld: 94 mg/dL (ref 70–99)
Potassium: 4.5 mmol/L (ref 3.5–5.1)
Sodium: 138 mmol/L (ref 135–145)

## 2021-08-07 LAB — PROTIME-INR
INR: 3 — ABNORMAL HIGH (ref 0.8–1.2)
Prothrombin Time: 31.2 seconds — ABNORMAL HIGH (ref 11.4–15.2)

## 2021-08-07 MED ORDER — WARFARIN SODIUM 5 MG PO TABS
5.0000 mg | ORAL_TABLET | Freq: Once | ORAL | Status: AC
  Administered 2021-08-07: 16:00:00 5 mg via ORAL
  Filled 2021-08-07: qty 1

## 2021-08-07 NOTE — Progress Notes (Signed)
ANTICOAGULATION CONSULT NOTE  Pharmacy Consult for warfarin Indication:  mechanical valve  Patient Measurements: Weight: 83.9 kg (184 lb 15.5 oz)  Vital Signs: Temp: 98.7 F (37.1 C) (01/17 0315) Temp Source: Oral (01/16 2133) BP: 132/49 (01/17 0315) Pulse Rate: 74 (01/17 0315)  Labs: Recent Labs    08/05/21 0433 08/06/21 0616 08/07/21 0428  HGB 7.9*  --  8.0*  HCT 25.0*  --  25.8*  PLT 186  --  154  LABPROT 29.2* 30.1* 31.2*  INR 2.8* 2.9* 3.0*  CREATININE 0.61 0.81 0.66     Estimated Creatinine Clearance: 49.7 mL/min (by C-G formula based on SCr of 0.66 mg/dL).   Medical History: Past Medical History:  Diagnosis Date   CHF (congestive heart failure) (HCC)    COPD (chronic obstructive pulmonary disease) (HCC)    Coronary artery disease    Dementia (HCC)    Diabetes mellitus without complication ([place])    Hyperlipidemia    Hypertension     Assessment: 88 year old female with altered mental status. Patient presented with INR 3.6 that has since trended up. Patient on warfarin for mechanical valve. Per review of Care Everywhere, it looks like she has had several supratherapeutic INRs in the past month.  Home warfarin regimen: 5 mg daily  Date INR Warfarin Dose  1/6 5.9 HELD  1/7 5.5 HELD  1/8 5.9 HELD  1/9 4.9 HELD  1/10 3.3 5 mg  1/11 2.4 6 mg  1/12 2.2 6 mg  1/13 2.1 7.5 mg   1/14 2.4 6 mg  1/15 2.8 5 mg  1/16 2.9 5 mg  1/17 3.0 5 mg    Goal of Therapy:  INR 2.5-3.5 Monitor platelets by anticoagulation protocol: Yes   Plan:  INR therapeutic. Will order warfarin 5 mg for this evening.  Hgb stable Daily INR ordered. CBC at least every 3 days.  Darnelle Bos, PharmD Clinical Pharmacist 08/07/2021 7:43 AM

## 2021-08-07 NOTE — Progress Notes (Signed)
Palliative Care Progress Note, Assessment & Plan   Patient Name: Rhonda Saunders       Date: 08/07/2021 DOB: [birth year]  Age: 88 y.o. MRN#: [id number] Attending Physician: Rhonda Shiley, MD Primary Care Physician: Rhonda Hector, MD Admit Date: 07/26/2021  Reason for Consultation/Follow-up: Establishing goals of care and Pain control  Subjective: Patient is lying in bed, resting, and in no apparent distress.  She acknowledges my presence and says hello.  She is able to make her wishes known.  Patient endorses pain in her low back and legs. Her responses are delayed but she is alert to self.  No family at bedside.    HPI: 88 y.o. female  with past medical history of HTN, CAD, diastolic CHF, AVR (Coumadin), asthma, diabetes type 2, dementia (Aricept), chronic pain, decubitus ulcers, chronic venous stasis ulcers (Unna boots), functional quadriplegia, COPD, CKD (stage IIIa), and pituitary adenoma admitted on 07/26/2021 with AMS, weakness, and foul odor from buttocks with suspected UTI.  Summary of counseling/coordination of care: After reviewing the patient's chart and assessing the patient at bedside, I spoke with the patient's son/HC POA Rhonda Saunders over the telephone.  Discussed need for more aggressive use of as needed pain medication.  Reviewed PMT's role in pain management for patient.  Educated son that layering medications gradually is necessary to monitor patient's cognitive status.  Outpatient palliative services described and offered.  Rhonda Saunders was appreciative and in agreement to meeting with palliative once patient is discharged.  Therapeutic silence and active listening offered for Rhonda Saunders to share his thoughts and emotions regarding his father's current health status.  Rhonda Saunders shared his concerns with patient's  hemoglobin and INR but is relieved to know that they are in normal limits for today.   PMT will continue to follow the patient throughout her hospitalization.  Questions and concerns from patient and family addressed.  Please reach out to PMT should medical team or family have palliative needs or if patient status declines.  Code Status: DNR  Prognosis: Unable to determine  Discharge Planning: [place] for rehab with Palliative care service follow-up  Recommendations/Plan: Utilize PRN Oxy IR in addition to Tylenol and Oxycontin  Care plan was discussed with patient, nursing, patient's [name]  Physical Exam Vitals and nursing note reviewed.  Constitutional:      General: She is not in acute distress.    Appearance: Normal appearance. She is not ill-appearing or toxic-appearing.  HENT:     Head: Normocephalic and atraumatic.     Mouth/Throat:     Mouth: Mucous membranes are moist.  Cardiovascular:     Rate and Rhythm: Normal rate.     Pulses: Normal pulses.  Pulmonary:     Effort: Pulmonary effort is normal.  Musculoskeletal:     Comments: Generalized weakness  Skin:    General: Skin is warm and dry.     Comments: Multiple wounds of bilateral feet and sacrum - UTA  Neurological:     Mental Status: She is alert. Mental status is at baseline.  Psychiatric:        Mood and Affect: Mood normal.        Behavior: Behavior normal.  Thought Content: Thought content normal.        Judgment: Judgment normal.            Palliative Assessment/Data: 40%    Total Time 25 minutes  Greater than 50%  of this time was spent counseling and coordinating care related to the above assessment and plan.  Thank you for allowing the Palliative Medicine Team to assist in the care of this patient.  Rhonda Ilsa Iha, FNP-BC Palliative Medicine Team Team Phone # [phone]

## 2021-08-07 NOTE — Progress Notes (Signed)
Nutrition Follow-up  DOCUMENTATION CODES:   Obesity unspecified  INTERVENTION:   -Continue MVI with minerals daily -Continue Ensure Enlive po TID, each supplement provides 350 kcal and 20 grams of protein  -Continue with liberalized diet of regular -Continue feeding assistance with meals -Continue 30 ml Prosource Plus BID, each supplement provides 100 kcals and 15 grams protein   NUTRITION DIAGNOSIS:   Increased nutrient needs related to wound healing as evidenced by estimated needs.  Ongoing  GOAL:   Patient will meet greater than or equal to 90% of their needs  Progressing   MONITOR:   PO intake, Supplement acceptance, Labs, Weight trends, Skin, I & O's  REASON FOR ASSESSMENT:   Low [place]    ASSESSMENT:   Rhonda Saunders is a 88 y.o. female with medical history significant for HTN, CAD, diastolic CHF, aortic valve replacement on Coumadin, asthma, DM, dementia, chronic pain, ischial decubitus ulcers, chronic venous stasis ulcers on Unna boots, who was brought to the ED with a complaint of altered mental status, weakness, foul odor from wounds on buttock and concern for possible UTI.  Reviewed I/O's: +238 ml x 24 hours and -1.9 L since admission  UOP 1 ml x 24 hours   Pt with variable intake. Noted meal completions 35-100%. Pt is taking both Ensure and Prosource Plus supplements.   No new wt available since admission.   Palliative care following for goals of care discussions; pt son has not reached back out to palliative care team regarding decisions.   Medications reviewed and include ferrous sulfate.   Labs reviewed.   Diet Order:   Diet Order             Diet regular Room service appropriate? Yes; Fluid consistency: Thin  Diet effective now                   EDUCATION NEEDS:   Education needs have been addressed  Skin:  Skin Assessment: Skin Integrity Issues: Skin Integrity Issues:: DTI, Stage III, Other (Comment) DTI: buttocks Stage III: rt  heel Other: lt hand skin tear, non-pressure wound to rt pretibial  Last BM:  08/06/21  Height:   Ht Readings from Last 1 Encounters:  07/10/21 5\' 2"  (1.575 m)    Weight:   Wt Readings from Last 1 Encounters:  07/26/21 83.9 kg    Ideal Body Weight:  50 kg  BMI:  Body mass index is 33.83 kg/m.  Estimated Nutritional Needs:   Kcal:  3532-9924  Protein:  85-100 grams  Fluid:  > 1.7 L    Loistine Chance, RD, LDN, [place] Registered Dietitian II Certified Diabetes Care and Education Specialist Please refer to [facility] for RD and/or RD on-call/weekend/after hours pager

## 2021-08-07 NOTE — Progress Notes (Signed)
PROGRESS NOTE    Rhonda Saunders  ZJI:[id number] DOB: February 22, 1932 DOA: 07/26/2021 PCP: Adin Hector, MD   Brief Narrative: 88 year old with past medical history significant for hypertension, CAD, diastolic heart failure, aortic valve replacement on Coumadin, asthma, diabetes, dementia, chronic pain, ischial decubitus ulcer, chronic venous stasis ulcer lower extremity on Unna boot, who presents to the ED complaining of weakness, full odor from wounds, concern for UTI.  Patient was also noted to be having altered mental status.   Patient admitted with infection decubitus wound and lower extremity.  She received 10 days course of IV antibiotics.  Recommendation was for lower extremity amputation, family has decided not to proceed with surgery.  Patient  is currently awaiting skilled nursing facility for long-term care, she will require palliative care follow-up at the facility.  Assessment & Plan:   Principal Problem:   Sepsis ([place]) Active Problems:   Coronary artery disease   Chronic diastolic CHF (congestive heart failure) (HCC)   Essential hypertension   Hx of aortic valve replacement, mechanical   Anticoagulated on warfarin   Senile dementia without behavioral disturbance (HCC)   Type 2 diabetes, diet controlled (HCC)   Chronic pain syndrome   Lower limb ulcer, ankle, right, with fat layer exposed ([place])   Decubitus ulcer, infected, unspecified pressure ulcer stage   Cellulitis   COPD (chronic obstructive pulmonary disease) (HCC)   Stage 3a chronic kidney disease (HCC)   Supratherapeutic INR   Acute metabolic encephalopathy   Functional quadriplegia (HCC)   Cellulitis of multiple sites of buttock   1-Decubitus ulcer buttock, unspecified pressure ulcer stage, cellulitis of buttock: -Patient presented with leukocytosis and lactic acidosis, no fevers or tachycardia. -Treated with  vancomycin  ceftriaxone. Completed 10 days, antibiotics.  -Wound care consulted. surgery  consulted. -Continue with wound care.   2-Right heel unstageable pressure ulcer, Right lower leg venous stasis unstageable ulcer Surgery  recommend amputation. Palliative care consulted for goals of care. Family has decide not to proceed with surgery.  Plan to transfer to SNF for long term care and palliative care follow up/.   Acute metabolic encephalopathy: Patient presented with altered mental status and lethargy. Probably related to medications, opioid and gabapentin OxyContin reduced to 10 mg twice daily as needed Gabapentin reduced to home dose Alert, follows command.   History of aortic valve replacement, mechanical Supra therapeutic INR Coumadin per pharmacy INR; 2.0 --2.2--2.4--2.9--3.0  Acute on chronic anemia, from blood loss Related to multiple blood drawn, bleeding from skin tears Hb down to 6.9. received 1 unit of packed red blood cell 1/11 Started  oral iron. Hb stable.   Skin tear of her left hand bleeding Exacerbated by swelling and supratherapeutic INR Applied Surgicel and compression dressing with Ace.  Functional Quadriplegia, Obesity: Need long term care.   Chronic diastolic heart failure: Appears euvolemic. CAD: Continue with statin.  Hypertension: Continue with Toprol.  Senile dementia without behavioral disturbance continue with Aricept.  Diabetes type 2: A1c 5.8. SSI.  Chronic Pain syndrome Neuropathic pain. Continue OxyContin 10 mg twice daily as needed. Complaints of pain, Added  low dose Oxycodone PRN.   COPD: Continue with albuterol as needed. Stage IIIa CKD: Monitor renal function Sepsis was ruled out.  Pressure Injury Stage II -  Partial thickness loss of dermis presenting as a shallow open ulcer with a red, pink wound bed without slough. Patient have small stage 2 (Active)     Location: Buttocks  Location Orientation: Left  Staging: Stage II -  Partial thickness loss of dermis presenting as a shallow open ulcer with a red, pink wound  bed without slough.  Wound Description (Comments): Patient have small stage 2  Present on Admission: Yes     Pressure Injury 07/27/21 Heel Right Stage 3 -  Full thickness tissue loss. Subcutaneous fat may be visible but bone, tendon or muscle are NOT exposed. (Active)  07/27/21 0612  Location: Heel  Location Orientation: Right  Staging: Stage 3 -  Full thickness tissue loss. Subcutaneous fat may be visible but bone, tendon or muscle are NOT exposed.  Wound Description (Comments):   Present on Admission:      Pressure Injury 07/27/21 Buttocks Bilateral Deep Tissue Pressure Injury - Purple or maroon localized area of discolored intact skin or blood-filled blister due to damage of underlying soft tissue from pressure and/or shear. (Active)  07/27/21 0614  Location: Buttocks  Location Orientation: Bilateral  Staging: Deep Tissue Pressure Injury - Purple or maroon localized area of discolored intact skin or blood-filled blister due to damage of underlying soft tissue from pressure and/or shear.  Wound Description (Comments):   Present on Admission:      Nutrition Problem: Increased nutrient needs Etiology: wound healing    Signs/Symptoms: estimated needs    Interventions: MVI, Juven, Premier Protein  Estimated body mass index is 33.83 kg/m as calculated from the following:   Height as of 07/10/21: 5\' 2"  (1.575 m).   Weight as of this encounter: 83.9 kg.   DVT prophylaxis: Coumadin  Code Status: DNR Family Communication: Son over phone 1/13 Disposition Plan:  Status is: Inpatient  Remains inpatient appropriate because: anemia, multiples wound. She will need SNF for long term care and Palliative care follow up. Awaiting Placement.         Consultants:  General surgery Palliative   Procedures:    Antimicrobials:    Subjective: She is alert, report pain is controlled.   Objective: Vitals:   08/06/21 2133 08/07/21 0315 08/07/21 0809 08/07/21 1218  BP: 133/62  (!) 132/49 (!) 145/62 (!) 128/53  Pulse: 75 74 76 73  Resp:   18 16  Temp: 98.8 F (37.1 C) 98.7 F (37.1 C) 98.4 F (36.9 C) 98.1 F (36.7 C)  TempSrc: Oral  Oral Oral  SpO2: 98% 99% 96% 95%  Weight:        Intake/Output Summary (Last 24 hours) at 08/07/2021 1542 Last data filed at 08/07/2021 1300 Gross per 24 hour  Intake 480 ml  Output 2 ml  Net 478 ml    Filed Weights   07/26/21 0301  Weight: 83.9 kg    Examination:  General exam: Chronic ill appearing Respiratory system: CTA Cardiovascular system: S 1, S 2 RRR mechanical valve click.  Gastrointestinal system: BS present, soft, nt Central nervous system: awake speech soft.  Extremities: large right heel ulcers and necrotic tissue. Right LE calf big open ulcer wound    Data Reviewed: I have personally reviewed following labs and imaging studies  CBC: Recent Labs  Lab 08/02/21 0636 08/03/21 0816 08/04/21 0619 08/05/21 0433 08/07/21 0428  WBC 9.4 9.8 9.7 9.0 7.5  HGB 7.8* 7.9* 8.1* 7.9* 8.0*  HCT 24.1* 24.3* 25.0* 25.0* 25.8*  MCV 90.3 90.3 91.2 92.9 93.5  PLT 173 175 181 186 387    Basic Metabolic Panel: Recent Labs  Lab 08/01/21 0533 08/02/21 0636 08/03/21 0816 08/04/21 0619 08/05/21 0433 08/06/21 0616 08/07/21 0428  NA 136 137 135 137 136  --  138  K 3.8 3.6 3.6 3.7 3.8  --  4.5  CL 106 105 104 103 107  --  108  CO2 27 28 25 27 25   --  27  GLUCOSE 106* 96 89 99 90  --  94  BUN 16 17 14 13 14   --  14  CREATININE 1.01* 0.87 0.70 0.72 0.61 0.81 0.66  CALCIUM 7.3* 7.7* 7.6* 7.9* 7.8*  --  8.0*  MG 2.3 2.5* 2.4 2.5* 2.6*  --   --     GFR: Estimated Creatinine Clearance: 49.7 mL/min (by C-G formula based on SCr of 0.66 mg/dL). Liver Function Tests: No results for input(s): AST, ALT, ALKPHOS, BILITOT, PROT, ALBUMIN in the last 168 hours. No results for input(s): LIPASE, AMYLASE in the last 168 hours. No results for input(s): AMMONIA in the last 168 hours. Coagulation Profile: Recent Labs   Lab 08/03/21 0816 08/04/21 0619 08/05/21 0433 08/06/21 0616 08/07/21 0428  INR 2.1* 2.4* 2.8* 2.9* 3.0*    Cardiac Enzymes: No results for input(s): CKTOTAL, CKMB, CKMBINDEX, TROPONINI in the last 168 hours. BNP (last 3 results) No results for input(s): PROBNP in the last 8760 hours. HbA1C: No results for input(s): HGBA1C in the last 72 hours. CBG: No results for input(s): GLUCAP in the last 168 hours.  Lipid Profile: No results for input(s): CHOL, HDL, LDLCALC, TRIG, CHOLHDL, LDLDIRECT in the last 72 hours. Thyroid Function Tests: No results for input(s): TSH, T4TOTAL, FREET4, T3FREE, THYROIDAB in the last 72 hours. Anemia Panel: No results for input(s): VITAMINB12, FOLATE, FERRITIN, TIBC, IRON, RETICCTPCT in the last 72 hours. Sepsis Labs: No results for input(s): PROCALCITON, LATICACIDVEN in the last 168 hours.   No results found for this or any previous visit (from the past 240 hour(s)).         Radiology Studies: No results found.      Scheduled Meds:  (feeding supplement) PROSource Plus  30 mL Oral BID BM   atorvastatin  80 mg Oral Daily   collagenase   Topical Daily   donepezil  5 mg Oral QHS   feeding supplement  237 mL Oral TID BM   ferrous sulfate  325 mg Oral Q breakfast   fluticasone furoate-vilanterol  1 puff Inhalation Daily   gabapentin  600 mg Oral BID   levothyroxine  25 mcg Oral Q0600   metoprolol succinate  12.5 mg Oral Daily   multivitamin with minerals  1 tablet Oral Daily   polyethylene glycol  17 g Oral Daily   warfarin  5 mg Oral ONCE-1600   Warfarin - Pharmacist Dosing Inpatient   Does not apply q1600   Continuous Infusions:      LOS: 12 days    Time spent: 35 minutes    Walburga Hudman A Avanni Turnbaugh, MD [facility]   If 7PM-7AM, please contact night-coverage [email or web address]  08/07/2021, 3:42 PM

## 2021-08-08 DIAGNOSIS — A419 Sepsis, unspecified organism: Secondary | ICD-10-CM | POA: Diagnosis not present

## 2021-08-08 LAB — PROTIME-INR
INR: 3.2 — ABNORMAL HIGH (ref 0.8–1.2)
Prothrombin Time: 32.5 seconds — ABNORMAL HIGH (ref 11.4–15.2)

## 2021-08-08 MED ORDER — WARFARIN SODIUM 5 MG PO TABS
5.0000 mg | ORAL_TABLET | Freq: Once | ORAL | Status: AC
Start: 2021-08-08 — End: 2021-08-08
  Administered 2021-08-08: 16:00:00 5 mg via ORAL
  Filled 2021-08-08: qty 1

## 2021-08-08 NOTE — Progress Notes (Signed)
PROGRESS NOTE    Rhonda Saunders  BJY:[id number] DOB: [birth year] DOA: 07/26/2021 PCP: Adin Hector, MD   Brief Narrative: 88 year old with past medical history significant for hypertension, CAD, diastolic heart failure, aortic valve replacement on Coumadin, asthma, diabetes, dementia, chronic pain, ischial decubitus ulcer, chronic venous stasis ulcer lower extremity on Unna boot, who presents to the ED complaining of weakness, full odor from wounds, concern for UTI.  Patient was also noted to be having altered mental status.   Patient admitted with infection decubitus wound and lower extremity.  She received 10 days course of IV antibiotics.  Recommendation was for lower extremity amputation, family has decided not to proceed with surgery.  Patient  is currently awaiting skilled nursing facility for long-term care, she will require palliative care follow-up at the facility.  Assessment & Plan:   Principal Problem:   Sepsis ([place]) Active Problems:   Coronary artery disease   Chronic diastolic CHF (congestive heart failure) (HCC)   Essential hypertension   Hx of aortic valve replacement, mechanical   Anticoagulated on warfarin   Senile dementia without behavioral disturbance (HCC)   Type 2 diabetes, diet controlled (HCC)   Chronic pain syndrome   Lower limb ulcer, ankle, right, with fat layer exposed ([place])   Decubitus ulcer, infected, unspecified pressure ulcer stage   Cellulitis   COPD (chronic obstructive pulmonary disease) (HCC)   Stage 3a chronic kidney disease (HCC)   Supratherapeutic INR   Acute metabolic encephalopathy   Functional quadriplegia (HCC)   Cellulitis of multiple sites of buttock   1-Decubitus ulcer buttock, unspecified pressure ulcer stage, cellulitis of buttock: -Patient presented with leukocytosis and lactic acidosis, no fevers or tachycardia. -Treated with  vancomycin  ceftriaxone. Completed 10 days, antibiotics.  -Wound care consulted. surgery  consulted. -Continue with wound care.   2-Right heel unstageable pressure ulcer, Right lower leg venous stasis unstageable ulcer Surgery  recommend amputation. Palliative care consulted for goals of care. Family has decide not to proceed with surgery.  Plan to transfer to SNF for long term care and palliative care follow up/.   Acute metabolic encephalopathy: Patient presented with altered mental status and lethargy. Probably related to medications, opioid and gabapentin OxyContin reduced to 10 mg twice daily as needed Gabapentin reduced to home dose Alert, follows command.   History of aortic valve replacement, mechanical Supra therapeutic INR Coumadin per pharmacy INR; 2.0 --2.2--2.4--2.9--3.0  Acute on chronic anemia, from blood loss Related to multiple blood drawn, bleeding from skin tears Hb down to 6.9. received 1 unit of packed red blood cell 1/11 Started  oral iron. Hb stable.   Skin tear of her left hand bleeding Exacerbated by swelling and supratherapeutic INR Applied Surgicel and compression dressing with Ace.  Functional Quadriplegia, Obesity: Need long term care.   Chronic diastolic heart failure: Appears euvolemic. CAD: Continue with statin.  Hypertension: Continue with Toprol.  Senile dementia without behavioral disturbance continue with Aricept.  Diabetes type 2: A1c 5.8. SSI.  Chronic Pain syndrome Neuropathic pain. Continue OxyContin 10 mg twice daily as needed. Complaints of pain, Added  low dose Oxycodone PRN.   COPD: Continue with albuterol as needed. Stage IIIa CKD: Monitor renal function Sepsis was ruled out.  Pressure Injury Stage II -  Partial thickness loss of dermis presenting as a shallow open ulcer with a red, pink wound bed without slough. Patient have small stage 2 (Active)     Location: Buttocks  Location Orientation: Left  Staging: Stage II -  Partial thickness loss of dermis presenting as a shallow open ulcer with a red, pink wound  bed without slough.  Wound Description (Comments): Patient have small stage 2  Present on Admission: Yes     Pressure Injury 07/27/21 Heel Right Stage 3 -  Full thickness tissue loss. Subcutaneous fat may be visible but bone, tendon or muscle are NOT exposed. (Active)  07/27/21 0612  Location: Heel  Location Orientation: Right  Staging: Stage 3 -  Full thickness tissue loss. Subcutaneous fat may be visible but bone, tendon or muscle are NOT exposed.  Wound Description (Comments):   Present on Admission:      Pressure Injury 07/27/21 Buttocks Bilateral Deep Tissue Pressure Injury - Purple or maroon localized area of discolored intact skin or blood-filled blister due to damage of underlying soft tissue from pressure and/or shear. (Active)  07/27/21 0614  Location: Buttocks  Location Orientation: Bilateral  Staging: Deep Tissue Pressure Injury - Purple or maroon localized area of discolored intact skin or blood-filled blister due to damage of underlying soft tissue from pressure and/or shear.  Wound Description (Comments):   Present on Admission:      Nutrition Problem: Increased nutrient needs Etiology: wound healing    Signs/Symptoms: estimated needs    Interventions: MVI, Juven, Premier Protein  Estimated body mass index is 33.83 kg/m as calculated from the following:   Height as of this encounter: 5\' 2"  (1.575 m).   Weight as of this encounter: 83.9 kg.   DVT prophylaxis: [id number] Code Status: DNR  Family Communication:  Status is: inpatient Dispo:   The patient is from: home Anticipated d/c is to: SNF Anticipated d/c date is: whenever bed available  Patient currently is medically stable to d/c.   Consultants:  General surgery Palliative   Procedures:    Antimicrobials:    Subjective: No new issues.  Objective: Vitals:   08/08/21 0758 08/08/21 1145 08/08/21 1200 08/08/21 1611  BP: (!) 117/49 (!) 127/51  136/61  Pulse: 70 72  65  Resp: 16 15  16    Temp: 98.1 F (36.7 C) 98.7 F (37.1 C)  97.8 F (36.6 C)  TempSrc:      SpO2: 100% 97%  99%  Weight:      Height:   5\' 2"  (1.575 m)     Intake/Output Summary (Last 24 hours) at 08/08/2021 2039 Last data filed at 08/08/2021 1857 Gross per 24 hour  Intake 360 ml  Output --  Net 360 ml   Filed Weights   07/26/21 0301  Weight: 83.9 kg    Examination:  Constitutional: NAD, sleeping CV: No cyanosis.   RESP: normal respiratory effort, on RA Extremities: left hand in ACE wrap, right heel wrapped in gauze SKIN: warm, dry   Data Reviewed: I have personally reviewed following labs and imaging studies  CBC: Recent Labs  Lab 08/02/21 0636 08/03/21 0816 08/04/21 0619 08/05/21 0433 08/07/21 0428  WBC 9.4 9.8 9.7 9.0 7.5  HGB 7.8* 7.9* 8.1* 7.9* 8.0*  HCT 24.1* 24.3* 25.0* 25.0* 25.8*  MCV 90.3 90.3 91.2 92.9 93.5  PLT 173 175 181 186 427   Basic Metabolic Panel: Recent Labs  Lab 08/02/21 0636 08/03/21 0816 08/04/21 0619 08/05/21 0433 08/06/21 0616 08/07/21 0428  NA 137 135 137 136  --  138  K 3.6 3.6 3.7 3.8  --  4.5  CL 105 104 103 107  --  108  CO2 28 25 27 25   --  27  GLUCOSE 96  89 99 90  --  94  BUN 17 14 13 14   --  14  CREATININE 0.87 0.70 0.72 0.61 0.81 0.66  CALCIUM 7.7* 7.6* 7.9* 7.8*  --  8.0*  MG 2.5* 2.4 2.5* 2.6*  --   --    GFR: Estimated Creatinine Clearance: 49.7 mL/min (by C-G formula based on SCr of 0.66 mg/dL). Liver Function Tests: No results for input(s): AST, ALT, ALKPHOS, BILITOT, PROT, ALBUMIN in the last 168 hours. No results for input(s): LIPASE, AMYLASE in the last 168 hours. No results for input(s): AMMONIA in the last 168 hours. Coagulation Profile: Recent Labs  Lab 08/04/21 0619 08/05/21 0433 08/06/21 0616 08/07/21 0428 08/08/21 0630  INR 2.4* 2.8* 2.9* 3.0* 3.2*   Cardiac Enzymes: No results for input(s): CKTOTAL, CKMB, CKMBINDEX, TROPONINI in the last 168 hours. BNP (last 3 results) No results for input(s): PROBNP  in the last 8760 hours. HbA1C: No results for input(s): HGBA1C in the last 72 hours. CBG: No results for input(s): GLUCAP in the last 168 hours.  Lipid Profile: No results for input(s): CHOL, HDL, LDLCALC, TRIG, CHOLHDL, LDLDIRECT in the last 72 hours. Thyroid Function Tests: No results for input(s): TSH, T4TOTAL, FREET4, T3FREE, THYROIDAB in the last 72 hours. Anemia Panel: No results for input(s): VITAMINB12, FOLATE, FERRITIN, TIBC, IRON, RETICCTPCT in the last 72 hours. Sepsis Labs: No results for input(s): PROCALCITON, LATICACIDVEN in the last 168 hours.   No results found for this or any previous visit (from the past 240 hour(s)).         Radiology Studies: No results found.      Scheduled Meds:  (feeding supplement) PROSource Plus  30 mL Oral BID BM   atorvastatin  80 mg Oral Daily   collagenase   Topical Daily   donepezil  5 mg Oral QHS   feeding supplement  237 mL Oral TID BM   ferrous sulfate  325 mg Oral Q breakfast   fluticasone furoate-vilanterol  1 puff Inhalation Daily   gabapentin  600 mg Oral BID   levothyroxine  25 mcg Oral Q0600   metoprolol succinate  12.5 mg Oral Daily   multivitamin with minerals  1 tablet Oral Daily   polyethylene glycol  17 g Oral Daily   Warfarin - Pharmacist Dosing Inpatient   Does not apply q1600   Continuous Infusions:      LOS: 13 days     Enzo Bi, MD [facility]   If 7PM-7AM, please contact night-coverage [email or web address]  08/08/2021, 8:39 PM

## 2021-08-08 NOTE — Progress Notes (Signed)
ANTICOAGULATION CONSULT NOTE  Pharmacy Consult for warfarin Indication:  mechanical valve  Patient Measurements: Weight: 83.9 kg (184 lb 15.5 oz)  Vital Signs: Temp: 99 F (37.2 C) (01/18 0427) Temp Source: Oral (01/18 0427) BP: 140/55 (01/18 0427) Pulse Rate: 76 (01/18 0427)  Labs: Recent Labs    08/06/21 0616 08/07/21 0428 08/08/21 0630  HGB  --  8.0*  --   HCT  --  25.8*  --   PLT  --  154  --   LABPROT 30.1* 31.2* 32.5*  INR 2.9* 3.0* 3.2*  CREATININE 0.81 0.66  --      Estimated Creatinine Clearance: 49.7 mL/min (by C-G formula based on SCr of 0.66 mg/dL).   Medical History: Past Medical History:  Diagnosis Date   CHF (congestive heart failure) (HCC)    COPD (chronic obstructive pulmonary disease) (HCC)    Coronary artery disease    Dementia (HCC)    Diabetes mellitus without complication ([place])    Hyperlipidemia    Hypertension     Assessment: 88 year old female with altered mental status. Patient presented with INR 3.6 that has since trended up. Patient on warfarin for mechanical valve. Per review of Care Everywhere, it looks like she has had several supratherapeutic INRs in the past month.  Home warfarin regimen: 5 mg daily  Date INR Warfarin Dose  1/6 5.9 HELD  1/7 5.5 HELD  1/8 5.9 HELD  1/9 4.9 HELD  1/10 3.3 5 mg  1/11 2.4 6 mg  1/12 2.2 6 mg  1/13 2.1 7.5 mg   1/14 2.4 6 mg  1/15 2.8 5 mg  1/16 2.9 5 mg  1/17 3.0 5 mg  1/18 3.2 5 mg    Goal of Therapy:  INR 2.5-3.5 Monitor platelets by anticoagulation protocol: Yes   Plan:  INR therapeutic. Will order warfarin 5 mg for this evening.  Hgb stable Daily INR ordered. CBC at least every 3 days.  Darnelle Bos, PharmD Clinical Pharmacist 08/08/2021 7:39 AM

## 2021-08-08 NOTE — NC FL2 (Signed)
[place] LEVEL OF CARE SCREENING TOOL     IDENTIFICATION  Patient Name: Rhonda Saunders Birthdate: 09-Aug-1931 Sex: female Admission Date (Current Location): 07/26/2021  [facility] and [place] Number:  Engineering geologist and Address:  [facility], [address] [place], [place] [address]      Provider Number: [id number]  Attending Physician Name and Address:  Enzo Bi, MD  Relative Name and Phone Number:  [place], Dao [facility])   [phone]    Current Level of Care: Hospital Recommended Level of Care: [place] (Long-term care with outpatient palliative) Prior Approval Number:    Date Approved/Denied:   PASRR Number: [id number]  Discharge Plan: SNF (Long-term care with outpatient palliative.)    Current Diagnoses: Patient Active Problem List   Diagnosis Date Noted   Cellulitis of multiple sites of buttock    Sepsis ([place]) 07/26/2021   Decubitus ulcer, infected, unspecified pressure ulcer stage 07/26/2021   Cellulitis 07/26/2021   Functional quadriplegia ([place]) 07/26/2021   COPD (chronic obstructive pulmonary disease) ([place])    Supratherapeutic INR    Acute metabolic encephalopathy    Stage 3a chronic kidney disease ([place]) 03/23/2021   Lower limb ulcer, ankle, right, with fat layer exposed ([place]) 02/15/2020   Decubitus ulcer of sacral region, stage 2 ([place]) 02/10/2020   Lymphedema 07/13/2019   Chronic thoracic back pain (Right) 08/06/2018   Postherpetic neuralgia (Thoracic) (Right) 08/06/2018   Thoracic radicular pain (Right) 08/06/2018   Thoracic radiculitis (Right) 08/06/2018   Elevated C-reactive protein (CRP) 08/03/2018   Elevated sed rate 08/03/2018   Chronic upper back pain (Primary Area of Pain) (Right) 07/27/2018   Neurogenic pain 07/27/2018   Chronic lower extremity pain (Tertiary Area of Pain) (Left) 07/27/2018   Chronic pain syndrome 07/27/2018   Long term current use of opiate analgesic 07/27/2018    Pharmacologic therapy 07/27/2018   Disorder of skeletal system 07/27/2018   Problems influencing health status 07/27/2018   Pressure ulcer of ischium, stage 2, unspecified laterality ([place]) 05/16/2018   Altered mental status    Palliative care by specialist    Goals of care, counseling/discussion    AKI (acute kidney injury) ([place]) 05/13/2018   Pituitary tumor 02/20/2018   Senile dementia without behavioral disturbance ([place]) 01/29/2018   Moderate persistent asthma without complication 10/93/2355   Pure hypercholesterolemia 12/17/2016   BMI 35.0-35.9,adult 01/04/2016   Morbid obesity ([place]) 01/04/2016   Chronic low back pain (Bilateral) (R>L) w/o sciatica 10/06/2015   Type 2 diabetes, diet controlled ([place]) 10/06/2015   Chronic diastolic CHF (congestive heart failure) ([place]) 04/21/2014   Anticoagulated on warfarin 04/22/2012   Coronary artery disease 11/26/2011   Essential hypertension 11/26/2011   Hearing loss of right ear 11/26/2011   Hx of aortic valve replacement, mechanical 11/26/2011   Hyperlipidemia 11/26/2011    Orientation RESPIRATION BLADDER Height & Weight     Self, Place  Normal Incontinent, External catheter Weight: 184 lb 15.5 oz (83.9 kg) Height:     BEHAVIORAL SYMPTOMS/MOOD NEUROLOGICAL BOWEL NUTRITION STATUS   (None)  (Senile Dementia) Incontinent Diet (Regular)  AMBULATORY STATUS COMMUNICATION OF NEEDS Skin   Extensive Assist Verbally Bruising, Other (Comment), PU Stage and Appropriate Care (Cellulitis, Cracking. Deep tissue injury on bilateral buttocks: Santyl, moist-to-dry, foam, gauze daily. Non-pressure wound on right posterior tibial: Santyl, moist-to-dry, gauze, ABD daily). Skin tear on left posterior hand: Gauze every 3 days.)     PU Stage 3 Dressing: Daily (Right heel: Moist-to-dry, Gauze, ABD, Santyl.)  Personal Care Assistance Level of Assistance  Total care       Total Care Assistance: Maximum assistance   Functional Limitations  Info  Sight, Hearing, Speech Sight Info: Adequate Hearing Info: Adequate Speech Info: Adequate    SPECIAL CARE FACTORS FREQUENCY                       Contractures Contractures Info: Not present    Additional Factors Info  Code Status, Allergies Code Status Info: DNR Allergies Info: Pentazocine           Current Medications (08/08/2021):  This is the current hospital active medication list Current Facility-Administered Medications  Medication Dose Route Frequency Provider Last Rate Last Admin   (feeding supplement) PROSource Plus liquid 30 mL  30 mL Oral BID BM Enzo Bi, MD   30 mL at 08/08/21 1100   acetaminophen (TYLENOL) tablet 650 mg  650 mg Oral Q6H PRN Athena Masse, MD   650 mg at 08/05/21 9163   Or   acetaminophen (TYLENOL) suppository 650 mg  650 mg Rectal Q6H PRN Athena Masse, MD       albuterol (PROVENTIL) (2.5 MG/3ML) 0.083% nebulizer solution 3 mL  3 mL Nebulization Q6H PRN Enzo Bi, MD       alum & mag hydroxide-simeth (MAALOX/MYLANTA) 200-200-20 MG/5ML suspension 15 mL  15 mL Oral Q6H PRN Enzo Bi, MD   15 mL at 07/30/21 1845   atorvastatin (LIPITOR) tablet 80 mg  80 mg Oral Daily Enzo Bi, MD   80 mg at 08/08/21 1059   calcium carbonate (TUMS - dosed in mg elemental calcium) chewable tablet 200 mg of elemental calcium  1 tablet Oral TID PRN Enzo Bi, MD   200 mg of elemental calcium at 07/30/21 1953   collagenase (SANTYL) ointment   Topical Daily Enzo Bi, MD   Given at 08/08/21 1100   donepezil (ARICEPT) tablet 5 mg  5 mg Oral QHS Enzo Bi, MD   5 mg at 08/07/21 2108   feeding supplement (ENSURE ENLIVE / ENSURE PLUS) liquid 237 mL  237 mL Oral TID BM Enzo Bi, MD   237 mL at 08/08/21 1100   ferrous sulfate tablet 325 mg  325 mg Oral Q breakfast Regalado, Belkys A, MD   325 mg at 08/08/21 0819   fluticasone furoate-vilanterol (BREO ELLIPTA) 200-25 MCG/ACT 1 puff  1 puff Inhalation Daily Enzo Bi, MD   1 puff at 08/08/21 0819   gabapentin  (NEURONTIN) tablet 600 mg  600 mg Oral BID Regalado, Belkys A, MD   600 mg at 08/08/21 1100   levothyroxine (SYNTHROID) tablet 25 mcg  25 mcg Oral Q0600 Enzo Bi, MD   25 mcg at 08/08/21 0543   metoprolol succinate (TOPROL-XL) 24 hr tablet 12.5 mg  12.5 mg Oral Daily Enzo Bi, MD   12.5 mg at 08/08/21 1100   multivitamin with minerals tablet 1 tablet  1 tablet Oral Daily Enzo Bi, MD   1 tablet at 08/08/21 1059   ondansetron (ZOFRAN) tablet 4 mg  4 mg Oral Q6H PRN Athena Masse, MD       Or   ondansetron [facility]) injection 4 mg  4 mg Intravenous Q6H PRN Athena Masse, MD       oxyCODONE (Oxy IR/ROXICODONE) immediate release tablet 5 mg  5 mg Oral Q6H PRN Regalado, Belkys A, MD   5 mg at 08/05/21 1035   oxyCODONE (OXYCONTIN) 12 hr tablet 10  mg  10 mg Oral Q12H PRN Regalado, Belkys A, MD   10 mg at 08/07/21 0957   polyethylene glycol (MIRALAX / GLYCOLAX) packet 17 g  17 g Oral Daily Enzo Bi, MD   17 g at 08/08/21 1059   Warfarin - Pharmacist Dosing Inpatient   Does not apply q1600 Tawnya Crook, [facility]   Given at 08/07/21 1608     Discharge Medications: Please see discharge summary for a list of discharge medications.  Relevant Imaging Results:  Relevant Lab Results:   Additional Information SS#: [id number]  Candie Chroman, LCSW

## 2021-08-09 DIAGNOSIS — L97312 Non-pressure chronic ulcer of right ankle with fat layer exposed: Secondary | ICD-10-CM

## 2021-08-09 LAB — PROTIME-INR
INR: 3.3 — ABNORMAL HIGH (ref 0.8–1.2)
Prothrombin Time: 33.8 seconds — ABNORMAL HIGH (ref 11.4–15.2)

## 2021-08-09 MED ORDER — WARFARIN SODIUM 5 MG PO TABS
5.0000 mg | ORAL_TABLET | Freq: Once | ORAL | Status: DC
Start: 2021-08-09 — End: 2021-08-09

## 2021-08-09 MED ORDER — OXYCODONE HCL ER 10 MG PO T12A
10.0000 mg | EXTENDED_RELEASE_TABLET | Freq: Two times a day (BID) | ORAL | Status: DC | PRN
Start: 2021-08-09 — End: 2021-08-16
  Administered 2021-08-09 – 2021-08-16 (×7): 10 mg via ORAL
  Filled 2021-08-09 (×7): qty 1

## 2021-08-09 MED ORDER — WARFARIN SODIUM 3 MG PO TABS
3.0000 mg | ORAL_TABLET | Freq: Once | ORAL | Status: AC
  Administered 2021-08-09: 3 mg via ORAL
  Filled 2021-08-09: qty 1

## 2021-08-09 NOTE — Care Management Important Message (Signed)
Important Message  Patient Details  Name: Rhonda Saunders MRN: [id number] Date of Birth: 08/14/1931   Medicare Important Message Given:  Yes     [name] A [name] 08/09/2021, 11:05 AM

## 2021-08-09 NOTE — TOC Progression Note (Addendum)
Transition of Care [facility]) - Progression Note    Patient Details  Name: Rhonda Saunders MRN: [id number] Date of Birth: 09-Jan-1932  Transition of Care [facility]) CM/SW [place], LCSW Phone Number: 08/09/2021, 9:42 AM  Clinical Narrative:  CSW continues to work on LTC placement. [facility] can bring her in under Medicare for wound care needs and then work on plan to transition to LTC. [facility] said they might be able to do this but want to talk with the son first. Left messages for [facility] representative for [place] and [place], [place], [place], and [place] to see if they can do the same. If going in under LTC, [facility] requires 30 days of payment up front. $284 for private room and [phone] for a semiprivate.   10:17 am: [facility] does not have any LTC beds.  10:53 am: No family at bedside. Tried calling son. Voicemail is full. Will try again later.  1:34 pm: Called son and provided bed offers. He and his wife will research and follow up with decision.  Expected Discharge Plan: [place] Barriers to Discharge: Continued Medical Work up  Expected Discharge Plan and Services Expected Discharge Plan: [place] Choice: [place] arrangements for the past 2 months: Single Family Home                                       Social Determinants of Health (SDOH) Interventions    Readmission Risk Interventions No flowsheet data found.

## 2021-08-09 NOTE — Progress Notes (Signed)
PROGRESS NOTE    Rhonda Saunders  GYF:[id number] DOB: 04/18/1932 DOA: 07/26/2021 PCP: Adin Hector, MD   Brief Narrative: 88 year old with past medical history significant for hypertension, CAD, diastolic heart failure, aortic valve replacement on Coumadin, asthma, diabetes, dementia, chronic pain, ischial decubitus ulcer, chronic venous stasis ulcer lower extremity on Unna boot, who presents to the ED complaining of weakness, full odor from wounds, concern for UTI.  Patient was also noted to be having altered mental status.   Patient admitted with infection decubitus wound and lower extremity.  She received 10 days course of IV antibiotics.  Recommendation was for lower extremity amputation, family has decided not to proceed with surgery.  Patient  is currently awaiting skilled nursing facility for long-term care, she will require palliative care follow-up at the facility.  Assessment & Plan:   Principal Problem:   Sepsis ([place]) Active Problems:   Coronary artery disease   Chronic diastolic CHF (congestive heart failure) (HCC)   Essential hypertension   Hx of aortic valve replacement, mechanical   Anticoagulated on warfarin   Senile dementia without behavioral disturbance (HCC)   Type 2 diabetes, diet controlled (HCC)   Chronic pain syndrome   Lower limb ulcer, ankle, right, with fat layer exposed ([place])   Decubitus ulcer, infected, unspecified pressure ulcer stage   Cellulitis   COPD (chronic obstructive pulmonary disease) (HCC)   Stage 3a chronic kidney disease (HCC)   Supratherapeutic INR   Acute metabolic encephalopathy   Functional quadriplegia (HCC)   Cellulitis of multiple sites of buttock   1-Decubitus ulcer buttock, unspecified pressure ulcer stage, cellulitis of buttock: -Patient presented with leukocytosis and lactic acidosis, no fevers or tachycardia. -Treated with  vancomycin  ceftriaxone. Completed 10 days, antibiotics.  -Wound care consulted. surgery  consulted. -Continue with wound care.   2-Right heel unstageable pressure ulcer, Right lower leg venous stasis unstageable ulcer Surgery  recommend amputation. Palliative care consulted for goals of care. Family has decide not to proceed with surgery.  Plan to transfer to SNF for long term care and palliative care follow up/.   Acute metabolic encephalopathy: Patient presented with altered mental status and lethargy. Probably related to medications, opioid and gabapentin OxyContin reduced to 10 mg twice daily as needed Gabapentin reduced to home dose Alert, follows command.   History of aortic valve replacement, mechanical Supra therapeutic INR Coumadin per pharmacy INR; 2.0 --2.2--2.4--2.9--3.0  Acute on chronic anemia, from blood loss Related to multiple blood drawn, bleeding from skin tears Hb down to 6.9. received 1 unit of packed red blood cell 1/11 Started  oral iron. Hb stable.   Skin tear of her left hand bleeding Exacerbated by swelling and supratherapeutic INR Applied Surgicel and compression dressing with Ace.  Functional Quadriplegia, Obesity: Need long term care.   Chronic diastolic heart failure: Appears euvolemic. CAD: Continue with statin.  Hypertension: Continue with Toprol.  Senile dementia without behavioral disturbance continue with Aricept.  Diabetes type 2: A1c 5.8. SSI.  Chronic Pain syndrome Neuropathic pain. Continue OxyContin 10 mg twice daily as needed. Complaints of pain, Added  low dose Oxycodone PRN.   COPD: Continue with albuterol as needed. Stage IIIa CKD: Monitor renal function Sepsis was ruled out.  Pressure Injury Stage II -  Partial thickness loss of dermis presenting as a shallow open ulcer with a red, pink wound bed without slough. Patient have small stage 2 (Active)     Location: Buttocks  Location Orientation: Left  Staging: Stage II -  Partial thickness loss of dermis presenting as a shallow open ulcer with a red, pink wound  bed without slough.  Wound Description (Comments): Patient have small stage 2  Present on Admission: Yes     Pressure Injury 07/27/21 Heel Right Stage 3 -  Full thickness tissue loss. Subcutaneous fat may be visible but bone, tendon or muscle are NOT exposed. (Active)  07/27/21 0612  Location: Heel  Location Orientation: Right  Staging: Stage 3 -  Full thickness tissue loss. Subcutaneous fat may be visible but bone, tendon or muscle are NOT exposed.  Wound Description (Comments):   Present on Admission:      Pressure Injury 07/27/21 Buttocks Bilateral Deep Tissue Pressure Injury - Purple or maroon localized area of discolored intact skin or blood-filled blister due to damage of underlying soft tissue from pressure and/or shear. (Active)  07/27/21 0614  Location: Buttocks  Location Orientation: Bilateral  Staging: Deep Tissue Pressure Injury - Purple or maroon localized area of discolored intact skin or blood-filled blister due to damage of underlying soft tissue from pressure and/or shear.  Wound Description (Comments):   Present on Admission:      Pressure Injury 08/08/21 Groin Right;Left;Medial Stage 2 -  Partial thickness loss of dermis presenting as a shallow open injury with a red, pink wound bed without slough. (Active)  08/08/21 2130  Location: Groin  Location Orientation: Right;Left;Medial  Staging: Stage 2 -  Partial thickness loss of dermis presenting as a shallow open injury with a red, pink wound bed without slough.  Wound Description (Comments):   Present on Admission:      Nutrition Problem: Increased nutrient needs Etiology: wound healing    Signs/Symptoms: estimated needs    Interventions: MVI, Juven, Premier Protein  Estimated body mass index is 33.83 kg/m as calculated from the following:   Height as of this encounter: 5\' 2"  (1.575 m).   Weight as of this encounter: 83.9 kg.   DVT prophylaxis: UX:[id number] Code Status: DNR  Family Communication:   Status is: inpatient Dispo:   The patient is from: home Anticipated d/c is to: SNF Anticipated d/c date is: whenever bed available  Patient currently is medically stable to d/c.   Subjective: Pt appeared to be sleeping most of the time.   Objective: Vitals:   08/09/21 0751 08/09/21 0945 08/09/21 1142 08/09/21 1603  BP: (!) 145/97 136/66 130/64 136/67  Pulse: 72 68 72 70  Resp: 18  20 18   Temp: 98.4 F (36.9 C)  98 F (36.7 C) 97.9 F (36.6 C)  TempSrc: Oral  Oral Oral  SpO2: 97%  94% 99%  Weight:      Height:        Intake/Output Summary (Last 24 hours) at 08/09/2021 1608 Last data filed at 08/09/2021 1353 Gross per 24 hour  Intake 480 ml  Output 400 ml  Net 80 ml   Filed Weights   07/26/21 0301  Weight: 83.9 kg    Examination:  Constitutional: NAD, sleeping, arousable, but quickly fell back to sleep HEENT: conjunctivae and lids normal, EOMI CV: No cyanosis.   RESP: normal respiratory effort, on RA Extremities: left fingers swollen, left hand wrapped in ACE wrap.  Right foot wrapped. SKIN: warm, dry   Data Reviewed: I have personally reviewed following labs and imaging studies  CBC: Recent Labs  Lab 08/03/21 0816 08/04/21 0619 08/05/21 0433 08/07/21 0428  WBC 9.8 9.7 9.0 7.5  HGB 7.9* 8.1* 7.9* 8.0*  HCT 24.3* 25.0* 25.0* 25.8*  MCV 90.3 91.2 92.9 93.5  PLT 175 181 186 353   Basic Metabolic Panel: Recent Labs  Lab 08/03/21 0816 08/04/21 0619 08/05/21 0433 08/06/21 0616 08/07/21 0428  NA 135 137 136  --  138  K 3.6 3.7 3.8  --  4.5  CL 104 103 107  --  108  CO2 25 27 25   --  27  GLUCOSE 89 99 90  --  94  BUN 14 13 14   --  14  CREATININE 0.70 0.72 0.61 0.81 0.66  CALCIUM 7.6* 7.9* 7.8*  --  8.0*  MG 2.4 2.5* 2.6*  --   --    GFR: Estimated Creatinine Clearance: 49.7 mL/min (by C-G formula based on SCr of 0.66 mg/dL). Liver Function Tests: No results for input(s): AST, ALT, ALKPHOS, BILITOT, PROT, ALBUMIN in the last 168 hours. No  results for input(s): LIPASE, AMYLASE in the last 168 hours. No results for input(s): AMMONIA in the last 168 hours. Coagulation Profile: Recent Labs  Lab 08/05/21 0433 08/06/21 0616 08/07/21 0428 08/08/21 0630 08/09/21 0525  INR 2.8* 2.9* 3.0* 3.2* 3.3*   Cardiac Enzymes: No results for input(s): CKTOTAL, CKMB, CKMBINDEX, TROPONINI in the last 168 hours. BNP (last 3 results) No results for input(s): PROBNP in the last 8760 hours. HbA1C: No results for input(s): HGBA1C in the last 72 hours. CBG: No results for input(s): GLUCAP in the last 168 hours.  Lipid Profile: No results for input(s): CHOL, HDL, LDLCALC, TRIG, CHOLHDL, LDLDIRECT in the last 72 hours. Thyroid Function Tests: No results for input(s): TSH, T4TOTAL, FREET4, T3FREE, THYROIDAB in the last 72 hours. Anemia Panel: No results for input(s): VITAMINB12, FOLATE, FERRITIN, TIBC, IRON, RETICCTPCT in the last 72 hours. Sepsis Labs: No results for input(s): PROCALCITON, LATICACIDVEN in the last 168 hours.   No results found for this or any previous visit (from the past 240 hour(s)).         Radiology Studies: No results found.      Scheduled Meds:  (feeding supplement) PROSource Plus  30 mL Oral BID BM   atorvastatin  80 mg Oral Daily   collagenase   Topical Daily   donepezil  5 mg Oral QHS   feeding supplement  237 mL Oral TID BM   ferrous sulfate  325 mg Oral Q breakfast   fluticasone furoate-vilanterol  1 puff Inhalation Daily   gabapentin  600 mg Oral BID   levothyroxine  25 mcg Oral Q0600   metoprolol succinate  12.5 mg Oral Daily   multivitamin with minerals  1 tablet Oral Daily   polyethylene glycol  17 g Oral Daily   warfarin  3 mg Oral ONCE-1600   Warfarin - Pharmacist Dosing Inpatient   Does not apply q1600   Continuous Infusions:      LOS: 14 days     Enzo Bi, MD [facility]   If 7PM-7AM, please contact night-coverage [email or web address]  08/09/2021, 4:08 PM

## 2021-08-09 NOTE — Progress Notes (Addendum)
ANTICOAGULATION CONSULT NOTE  Pharmacy Consult for warfarin Indication:  mechanical valve  Patient Measurements: Height: 5\' 2"  (157.5 cm) Weight: 83.9 kg (184 lb 15.5 oz) IBW/kg (Calculated) : 50.1  Vital Signs: Temp: 98.7 F (37.1 C) (01/19 0621) Temp Source: Oral (01/19 0621) BP: 135/64 (01/19 0621) Pulse Rate: 71 (01/19 0621)  Labs: Recent Labs    08/07/21 0428 08/08/21 0630 08/09/21 0525  HGB 8.0*  --   --   HCT 25.8*  --   --   PLT 154  --   --   LABPROT 31.2* 32.5* 33.8*  INR 3.0* 3.2* 3.3*  CREATININE 0.66  --   --      Estimated Creatinine Clearance: 49.7 mL/min (by C-G formula based on SCr of 0.66 mg/dL).   Medical History: Past Medical History:  Diagnosis Date   CHF (congestive heart failure) (HCC)    COPD (chronic obstructive pulmonary disease) (HCC)    Coronary artery disease    Dementia (HCC)    Diabetes mellitus without complication ([place])    Hyperlipidemia    Hypertension     Assessment: 88 year old female with altered mental status. Patient presented with INR 3.6 that has since trended up. Patient on warfarin for mechanical valve. Per review of Care Everywhere, it looks like she has had several supratherapeutic INRs in the past month.  Home warfarin regimen: 5 mg daily  Date INR Warfarin Dose  1/6 5.9 HELD  1/7 5.5 HELD  1/8 5.9 HELD  1/9 4.9 HELD  1/10 3.3 5 mg  1/11 2.4 6 mg  1/12 2.2 6 mg  1/13 2.1 7.5 mg   1/14 2.4 6 mg  1/15 2.8 5 mg  1/16 2.9 5 mg  1/17 3.0 5 mg  1/18 3.2 5 mg  1/19 3.3 3 mg    Goal of Therapy:  INR 2.5-3.5 Monitor platelets by anticoagulation protocol: Yes   Plan:  INR therapeutic, but steady trending up.  Will order one time reduced dose of warfarin 3 mg for this evening.  Hgb stable Daily INR ordered. CBC at least every 3 days.  Dorothe Pea, PharmD, BCPS Clinical Pharmacist 08/09/2021 7:28 AM

## 2021-08-10 DIAGNOSIS — L97312 Non-pressure chronic ulcer of right ankle with fat layer exposed: Secondary | ICD-10-CM | POA: Diagnosis not present

## 2021-08-10 LAB — CBC
HCT: 28.1 % — ABNORMAL LOW (ref 36.0–46.0)
Hemoglobin: 8.9 g/dL — ABNORMAL LOW (ref 12.0–15.0)
MCH: 29.7 pg (ref 26.0–34.0)
MCHC: 31.7 g/dL (ref 30.0–36.0)
MCV: 93.7 fL (ref 80.0–100.0)
Platelets: 117 10*3/uL — ABNORMAL LOW (ref 150–400)
RBC: 3 MIL/uL — ABNORMAL LOW (ref 3.87–5.11)
RDW: 20.1 % — ABNORMAL HIGH (ref 11.5–15.5)
WBC: 6.7 10*3/uL (ref 4.0–10.5)
nRBC: 0 % (ref 0.0–0.2)

## 2021-08-10 LAB — PROTIME-INR
INR: 3.2 — ABNORMAL HIGH (ref 0.8–1.2)
Prothrombin Time: 32.7 seconds — ABNORMAL HIGH (ref 11.4–15.2)

## 2021-08-10 MED ORDER — WARFARIN SODIUM 5 MG PO TABS
5.0000 mg | ORAL_TABLET | Freq: Once | ORAL | Status: AC
Start: 2021-08-10 — End: 2021-08-10
  Administered 2021-08-10: 5 mg via ORAL
  Filled 2021-08-10: qty 1

## 2021-08-10 NOTE — Progress Notes (Signed)
PROGRESS NOTE    Rhonda Saunders  JYN:[id number] DOB: [birth year] DOA: 07/26/2021 PCP: Adin Hector, MD   Brief Narrative: 88 year old with past medical history significant for hypertension, CAD, diastolic heart failure, aortic valve replacement on Coumadin, asthma, diabetes, dementia, chronic pain, ischial decubitus ulcer, chronic venous stasis ulcer lower extremity on Unna boot, who presents to the ED complaining of weakness, full odor from wounds, concern for UTI.  Patient was also noted to be having altered mental status.   Patient admitted with infection decubitus wound and lower extremity.  She received 10 days course of IV antibiotics.  Recommendation was for lower extremity amputation, family has decided not to proceed with surgery.  Patient  is currently awaiting skilled nursing facility for long-term care, she will require palliative care follow-up at the facility.  Assessment & Plan:   Principal Problem:   Sepsis ([place]) Active Problems:   Coronary artery disease   Chronic diastolic CHF (congestive heart failure) (HCC)   Essential hypertension   Hx of aortic valve replacement, mechanical   Anticoagulated on warfarin   Senile dementia without behavioral disturbance (HCC)   Type 2 diabetes, diet controlled (HCC)   Chronic pain syndrome   Lower limb ulcer, ankle, right, with fat layer exposed ([place])   Decubitus ulcer, infected, unspecified pressure ulcer stage   Cellulitis   COPD (chronic obstructive pulmonary disease) (HCC)   Stage 3a chronic kidney disease (HCC)   Supratherapeutic INR   Acute metabolic encephalopathy   Functional quadriplegia (HCC)   Cellulitis of multiple sites of buttock   1-Decubitus ulcer buttock, unspecified pressure ulcer stage, cellulitis of buttock: -Patient presented with leukocytosis and lactic acidosis, no fevers or tachycardia. -Treated with  vancomycin  ceftriaxone. Completed 10 days, antibiotics.  -Wound care consulted. surgery  consulted. -Continue with wound care.   2-Right heel unstageable pressure ulcer, Right lower leg venous stasis unstageable ulcer Surgery  recommend amputation. Palliative care consulted for goals of care. Family has decide not to proceed with surgery.  Plan to transfer to SNF for long term care and palliative care follow up/.   Acute metabolic encephalopathy: Patient presented with altered mental status and lethargy. Probably related to medications, opioid and gabapentin OxyContin reduced to 10 mg twice daily as needed Gabapentin reduced to home dose Alert, follows command.   History of aortic valve replacement, mechanical Supra therapeutic INR Coumadin per pharmacy INR; 2.0 --2.2--2.4--2.9--3.0  Acute on chronic anemia, from blood loss Related to multiple blood drawn, bleeding from skin tears Hb down to 6.9. received 1 unit of packed red blood cell 1/11 Started  oral iron. Hb stable.   Skin tear of her left hand bleeding Exacerbated by swelling and supratherapeutic INR Applied Surgicel and compression dressing with Ace.  Functional Quadriplegia, Obesity: Need long term care.   Chronic diastolic heart failure: Appears euvolemic. CAD: Continue with statin.  Hypertension: Continue with Toprol.  Senile dementia without behavioral disturbance continue with Aricept.  Diabetes type 2: A1c 5.8. SSI.  Chronic Pain syndrome Neuropathic pain. Continue OxyContin 10 mg twice daily as needed. Complaints of pain, Added  low dose Oxycodone PRN.   COPD: Continue with albuterol as needed. Stage IIIa CKD: Monitor renal function Sepsis was ruled out.  Pressure Injury Stage II -  Partial thickness loss of dermis presenting as a shallow open ulcer with a red, pink wound bed without slough. Patient have small stage 2 (Active)     Location: Buttocks  Location Orientation: Left  Staging: Stage II -  Partial thickness loss of dermis presenting as a shallow open ulcer with a red, pink wound  bed without slough.  Wound Description (Comments): Patient have small stage 2  Present on Admission: Yes     Pressure Injury 07/27/21 Heel Right Stage 3 -  Full thickness tissue loss. Subcutaneous fat may be visible but bone, tendon or muscle are NOT exposed. (Active)  07/27/21 0612  Location: Heel  Location Orientation: Right  Staging: Stage 3 -  Full thickness tissue loss. Subcutaneous fat may be visible but bone, tendon or muscle are NOT exposed.  Wound Description (Comments):   Present on Admission:      Pressure Injury 07/27/21 Buttocks Bilateral Deep Tissue Pressure Injury - Purple or maroon localized area of discolored intact skin or blood-filled blister due to damage of underlying soft tissue from pressure and/or shear. (Active)  07/27/21 0614  Location: Buttocks  Location Orientation: Bilateral  Staging: Deep Tissue Pressure Injury - Purple or maroon localized area of discolored intact skin or blood-filled blister due to damage of underlying soft tissue from pressure and/or shear.  Wound Description (Comments):   Present on Admission:      Pressure Injury 08/08/21 Groin Right;Left;Medial Stage 2 -  Partial thickness loss of dermis presenting as a shallow open injury with a red, pink wound bed without slough. (Active)  08/08/21 2130  Location: Groin  Location Orientation: Right;Left;Medial  Staging: Stage 2 -  Partial thickness loss of dermis presenting as a shallow open injury with a red, pink wound bed without slough.  Wound Description (Comments):   Present on Admission:      Nutrition Problem: Increased nutrient needs Etiology: wound healing    Signs/Symptoms: estimated needs    Interventions: MVI, Juven, Premier Protein  Estimated body mass index is 33.83 kg/m as calculated from the following:   Height as of this encounter: 5\' 2"  (1.575 m).   Weight as of this encounter: 83.9 kg.   DVT prophylaxis: [id number] Code Status: DNR  Family Communication:   Status is: inpatient Dispo:   The patient is from: home Anticipated d/c is to: SNF Anticipated d/c date is: whenever bed available  Patient currently is medically stable to d/c.   Subjective: Pt appeared to be sleeping most of the time.  Did wake up to drink some water.  Said she was not hungry,  Denied pain.   Objective: Vitals:   08/10/21 0744 08/10/21 0949 08/10/21 1141 08/10/21 1602  BP: (!) 150/61 (!) 141/59 (!) 151/65 (!) 144/47  Pulse: 74 75 72 78  Resp: 16  16 16   Temp: 99.4 F (37.4 C)  98.3 F (36.8 C) 98.9 F (37.2 C)  TempSrc: Oral  Oral Oral  SpO2: 98%  98% 97%  Weight:      Height:        Intake/Output Summary (Last 24 hours) at 08/10/2021 1811 Last data filed at 08/10/2021 0900 Gross per 24 hour  Intake 120 ml  Output 400 ml  Net -280 ml   Filed Weights   07/26/21 0301  Weight: 83.9 kg    Examination: Constitutional: NAD, lethargic but arousable, oriented to person and place HEENT: conjunctivae and lids normal, EOMI CV: No cyanosis.   RESP: normal respiratory effort, on RA SKIN: warm, dry   Data Reviewed: I have personally reviewed following labs and imaging studies  CBC: Recent Labs  Lab 08/04/21 0619 08/05/21 0433 08/07/21 0428 08/10/21 0616  WBC 9.7 9.0 7.5 6.7  HGB 8.1* 7.9* 8.0* 8.9*  HCT 25.0* 25.0* 25.8* 28.1*  MCV 91.2 92.9 93.5 93.7  PLT 181 186 154 962*   Basic Metabolic Panel: Recent Labs  Lab 08/04/21 0619 08/05/21 0433 08/06/21 0616 08/07/21 0428  NA 137 136  --  138  K 3.7 3.8  --  4.5  CL 103 107  --  108  CO2 27 25  --  27  GLUCOSE 99 90  --  94  BUN 13 14  --  14  CREATININE 0.72 0.61 0.81 0.66  CALCIUM 7.9* 7.8*  --  8.0*  MG 2.5* 2.6*  --   --    GFR: Estimated Creatinine Clearance: 49.7 mL/min (by C-G formula based on SCr of 0.66 mg/dL). Liver Function Tests: No results for input(s): AST, ALT, ALKPHOS, BILITOT, PROT, ALBUMIN in the last 168 hours. No results for input(s): LIPASE, AMYLASE in the  last 168 hours. No results for input(s): AMMONIA in the last 168 hours. Coagulation Profile: Recent Labs  Lab 08/06/21 0616 08/07/21 0428 08/08/21 0630 08/09/21 0525 08/10/21 0616  INR 2.9* 3.0* 3.2* 3.3* 3.2*   Cardiac Enzymes: No results for input(s): CKTOTAL, CKMB, CKMBINDEX, TROPONINI in the last 168 hours. BNP (last 3 results) No results for input(s): PROBNP in the last 8760 hours. HbA1C: No results for input(s): HGBA1C in the last 72 hours. CBG: No results for input(s): GLUCAP in the last 168 hours.  Lipid Profile: No results for input(s): CHOL, HDL, LDLCALC, TRIG, CHOLHDL, LDLDIRECT in the last 72 hours. Thyroid Function Tests: No results for input(s): TSH, T4TOTAL, FREET4, T3FREE, THYROIDAB in the last 72 hours. Anemia Panel: No results for input(s): VITAMINB12, FOLATE, FERRITIN, TIBC, IRON, RETICCTPCT in the last 72 hours. Sepsis Labs: No results for input(s): PROCALCITON, LATICACIDVEN in the last 168 hours.   No results found for this or any previous visit (from the past 240 hour(s)).         Radiology Studies: No results found.      Scheduled Meds:  (feeding supplement) PROSource Plus  30 mL Oral BID BM   atorvastatin  80 mg Oral Daily   collagenase   Topical Daily   donepezil  5 mg Oral QHS   feeding supplement  237 mL Oral TID BM   ferrous sulfate  325 mg Oral Q breakfast   fluticasone furoate-vilanterol  1 puff Inhalation Daily   gabapentin  600 mg Oral BID   levothyroxine  25 mcg Oral Q0600   metoprolol succinate  12.5 mg Oral Daily   multivitamin with minerals  1 tablet Oral Daily   polyethylene glycol  17 g Oral Daily   Warfarin - Pharmacist Dosing Inpatient   Does not apply q1600   Continuous Infusions:      LOS: 15 days     Enzo Bi, MD [facility]   If 7PM-7AM, please contact night-coverage [email or web address]  08/10/2021, 6:11 PM

## 2021-08-10 NOTE — Progress Notes (Signed)
ANTICOAGULATION CONSULT NOTE  Pharmacy Consult for warfarin Indication:  mechanical valve  Patient Measurements: Height: 5\' 2"  (157.5 cm) Weight: 83.9 kg (184 lb 15.5 oz) IBW/kg (Calculated) : 50.1  Vital Signs: Temp: 99.4 F (37.4 C) (01/20 0744) Temp Source: Oral (01/20 0744) BP: 150/61 (01/20 0744) Pulse Rate: 74 (01/20 0744)  Labs: Recent Labs    08/08/21 0630 08/09/21 0525 08/10/21 0616  HGB  --   --  8.9*  HCT  --   --  28.1*  PLT  --   --  117*  LABPROT 32.5* 33.8* 32.7*  INR 3.2* 3.3* 3.2*     Estimated Creatinine Clearance: 49.7 mL/min (by C-G formula based on SCr of 0.66 mg/dL).   Medical History: Past Medical History:  Diagnosis Date   CHF (congestive heart failure) (HCC)    COPD (chronic obstructive pulmonary disease) (HCC)    Coronary artery disease    Dementia (HCC)    Diabetes mellitus without complication ([place])    Hyperlipidemia    Hypertension     Assessment: 88 year old female with altered mental status. Patient presented with INR 3.6 that has since trended up. Patient on warfarin for mechanical valve. Per review of Care Everywhere, it looks like she has had several supratherapeutic INRs in the past month.  Home warfarin regimen: 5 mg daily  Date INR Warfarin Dose  1/6 5.9 HELD  1/7 5.5 HELD  1/8 5.9 HELD  1/9 4.9 HELD  1/10 3.3 5 mg  1/11 2.4 6 mg  1/12 2.2 6 mg  1/13 2.1 7.5 mg   1/14 2.4 6 mg  1/15 2.8 5 mg  1/16 2.9 5 mg  1/17 3.0 5 mg  1/18 3.2 5 mg  1/19 3.3 3 mg  1/20 3.2 5 mg    Goal of Therapy:  INR 2.5-3.5 Monitor platelets by anticoagulation protocol: Yes   Plan:  INR therapeutic. Will order home dose of warfarin 5 mg for this evening.  Hgb stable, platelets trending down. CTM Daily INR ordered. CBC at least every 3 days.  Dorothe Pea, PharmD, BCPS Clinical Pharmacist 08/10/2021 7:57 AM

## 2021-08-10 NOTE — TOC Progression Note (Addendum)
Transition of Care [facility]) - Progression Note    Patient Details  Name: Rhonda Saunders MRN: [id number] Date of Birth: August 07, 1931  Transition of Care [facility]) CM/SW [place], LCSW Phone Number: 08/10/2021, 12:34 PM  Clinical Narrative:  [facility] is no longer able to offer a bed. Son is going to [place] to see facility and speak with staff.   4:03 pm: Left voicemail for [facility] admissions coordinator to see if they could accept patient. Son is going to tour [employer].  Expected Discharge Plan: [place] Barriers to Discharge: Continued Medical Work up  Expected Discharge Plan and Services Expected Discharge Plan: [place]: [place] arrangements for the past 2 months: Single Family Home                                       Social Determinants of Health (SDOH) Interventions    Readmission Risk Interventions No flowsheet data found.

## 2021-08-11 DIAGNOSIS — R532 Functional quadriplegia: Secondary | ICD-10-CM | POA: Diagnosis not present

## 2021-08-11 LAB — PROTIME-INR
INR: 3 — ABNORMAL HIGH (ref 0.8–1.2)
Prothrombin Time: 31.4 seconds — ABNORMAL HIGH (ref 11.4–15.2)

## 2021-08-11 MED ORDER — WARFARIN SODIUM 5 MG PO TABS
5.0000 mg | ORAL_TABLET | Freq: Once | ORAL | Status: AC
  Administered 2021-08-11: 17:00:00 5 mg via ORAL
  Filled 2021-08-11: qty 1

## 2021-08-11 NOTE — TOC Progression Note (Signed)
Transition of Care [facility]) - Progression Note    Patient Details  Name: Rhonda Saunders MRN: [id number] Date of Birth: [birth year]  Transition of Care [facility]) CM/SW Contact  Zigmund Daniel Dorian Pod, RN Phone Number:[phone] 08/11/2021, 3:14 PM  Clinical Narrative:    Followed up with Tressa Busman at [facility] however only able to leave a HIPAA approved voice message requesting a call back.   TOC will continue to follow up with facility for possible placement.    Expected Discharge Plan: [place] Barriers to Discharge: Continued Medical Work up  Expected Discharge Plan and Services Expected Discharge Plan: [place]: [place] arrangements for the past 2 months: Single Family Home                                       Social Determinants of Health (SDOH) Interventions    Readmission Risk Interventions No flowsheet data found.

## 2021-08-11 NOTE — Progress Notes (Signed)
Pt refused to have ordered RLE dressing changed. Reapproached PT - still refused.Able to change sacrum and LUE dressing.

## 2021-08-11 NOTE — Progress Notes (Signed)
PROGRESS NOTE    Rhonda Saunders  XBM:[id number] DOB: 14-Jun-1932 DOA: 07/26/2021 PCP: Adin Hector, MD   Brief Narrative: 88 year old with past medical history significant for hypertension, CAD, diastolic heart failure, aortic valve replacement on Coumadin, asthma, diabetes, dementia, chronic pain, ischial decubitus ulcer, chronic venous stasis ulcer lower extremity on Unna boot, who presents to the ED complaining of weakness, full odor from wounds, concern for UTI.  Patient was also noted to be having altered mental status.   Patient admitted with infection decubitus wound and lower extremity.  She received 10 days course of IV antibiotics.  Recommendation was for lower extremity amputation, family has decided not to proceed with surgery.  Patient  is currently awaiting skilled nursing facility for long-term care, she will require palliative care follow-up at the facility.  Assessment & Plan:   Principal Problem:   Sepsis ([place]) Active Problems:   Coronary artery disease   Chronic diastolic CHF (congestive heart failure) (HCC)   Essential hypertension   Hx of aortic valve replacement, mechanical   Anticoagulated on warfarin   Senile dementia without behavioral disturbance (HCC)   Type 2 diabetes, diet controlled (HCC)   Chronic pain syndrome   Lower limb ulcer, ankle, right, with fat layer exposed ([place])   Decubitus ulcer, infected, unspecified pressure ulcer stage   Cellulitis   COPD (chronic obstructive pulmonary disease) (HCC)   Stage 3a chronic kidney disease (HCC)   Supratherapeutic INR   Acute metabolic encephalopathy   Functional quadriplegia (HCC)   Cellulitis of multiple sites of buttock   1-Decubitus ulcer buttock, unspecified pressure ulcer stage, cellulitis of buttock: -Patient presented with leukocytosis and lactic acidosis, no fevers or tachycardia. -Treated with  vancomycin  ceftriaxone. Completed 10 days, antibiotics.  -Wound care consulted. surgery  consulted. -Continue with wound care.   2-Right heel unstageable pressure ulcer, Right lower leg venous stasis unstageable ulcer Surgery  recommend amputation. Palliative care consulted for goals of care. Family has decide not to proceed with surgery.  Plan to transfer to SNF for long term care and palliative care follow up/.   Acute metabolic encephalopathy: Patient presented with altered mental status and lethargy. Probably related to medications, opioid and gabapentin OxyContin reduced to 10 mg twice daily as needed Gabapentin reduced to home dose Alert, follows command.   History of aortic valve replacement, mechanical Supra therapeutic INR Coumadin per pharmacy INR; 2.0 --2.2--2.4--2.9--3.0  Acute on chronic anemia, from blood loss Related to multiple blood drawn, bleeding from skin tears Hb down to 6.9. received 1 unit of packed red blood cell 1/11 Started  oral iron. Hb stable.   Skin tear of her left hand bleeding Exacerbated by swelling and supratherapeutic INR Applied Surgicel and compression dressing with Ace.  Functional Quadriplegia, Obesity: Need long term care.   Chronic diastolic heart failure: Appears euvolemic. CAD: Continue with statin.  Hypertension: Continue with Toprol.  Senile dementia without behavioral disturbance continue with Aricept.  Diabetes type 2: A1c 5.8. SSI.  Chronic Pain syndrome Neuropathic pain. Continue OxyContin 10 mg twice daily as needed. Complaints of pain, Added  low dose Oxycodone PRN.   COPD: Continue with albuterol as needed. Stage IIIa CKD: Monitor renal function Sepsis was ruled out.  Pressure Injury Stage II -  Partial thickness loss of dermis presenting as a shallow open ulcer with a red, pink wound bed without slough. Patient have small stage 2 (Active)     Location: Buttocks  Location Orientation: Left  Staging: Stage II -  Partial thickness loss of dermis presenting as a shallow open ulcer with a red, pink wound  bed without slough.  Wound Description (Comments): Patient have small stage 2  Present on Admission: Yes     Pressure Injury 07/27/21 Heel Right Stage 3 -  Full thickness tissue loss. Subcutaneous fat may be visible but bone, tendon or muscle are NOT exposed. (Active)  07/27/21 0612  Location: Heel  Location Orientation: Right  Staging: Stage 3 -  Full thickness tissue loss. Subcutaneous fat may be visible but bone, tendon or muscle are NOT exposed.  Wound Description (Comments):   Present on Admission:      Pressure Injury 07/27/21 Buttocks Bilateral Deep Tissue Pressure Injury - Purple or maroon localized area of discolored intact skin or blood-filled blister due to damage of underlying soft tissue from pressure and/or shear. (Active)  07/27/21 0614  Location: Buttocks  Location Orientation: Bilateral  Staging: Deep Tissue Pressure Injury - Purple or maroon localized area of discolored intact skin or blood-filled blister due to damage of underlying soft tissue from pressure and/or shear.  Wound Description (Comments):   Present on Admission:      Pressure Injury 08/08/21 Groin Right;Left;Medial Stage 2 -  Partial thickness loss of dermis presenting as a shallow open injury with a red, pink wound bed without slough. (Active)  08/08/21 2130  Location: Groin  Location Orientation: Right;Left;Medial  Staging: Stage 2 -  Partial thickness loss of dermis presenting as a shallow open injury with a red, pink wound bed without slough.  Wound Description (Comments):   Present on Admission:      Nutrition Problem: Increased nutrient needs Etiology: wound healing    Signs/Symptoms: estimated needs    Interventions: MVI, Juven, Premier Protein  Estimated body mass index is 33.83 kg/m as calculated from the following:   Height as of this encounter: 5\' 2"  (1.575 m).   Weight as of this encounter: 83.9 kg.   DVT prophylaxis: [id number] Code Status: DNR  Family Communication: son  updated at bedside today Status is: inpatient Dispo:   The patient is from: home Anticipated d/c is to: SNF Anticipated d/c date is: whenever bed available  Patient currently is medically stable to d/c.   Subjective: Continued to have poor oral intake with reduced urine output.  Had pain with dressing change over her right foot.   Objective: Vitals:   08/11/21 0425 08/11/21 0731 08/11/21 0738 08/11/21 1145  BP: (!) 165/74  137/79 140/69  Pulse: 74 76 87 79  Resp: 18 14  16   Temp: 98.4 F (36.9 C) 99.2 F (37.3 C)  99.5 F (37.5 C)  TempSrc: Oral Oral  Oral  SpO2: 97% 99%  99%  Weight:      Height:        Intake/Output Summary (Last 24 hours) at 08/11/2021 1546 Last data filed at 08/11/2021 0500 Gross per 24 hour  Intake 0 ml  Output 350 ml  Net -350 ml   Filed Weights   07/26/21 0301  Weight: 83.9 kg    Examination:  Constitutional: NAD, lethargic HEENT: conjunctivae and lids normal, EOMI CV: No cyanosis.   RESP: normal respiratory effort, on RA SKIN: warm, dry   Data Reviewed: I have personally reviewed following labs and imaging studies  CBC: Recent Labs  Lab 08/05/21 0433 08/07/21 0428 08/10/21 0616  WBC 9.0 7.5 6.7  HGB 7.9* 8.0* 8.9*  HCT 25.0* 25.8* 28.1*  MCV 92.9 93.5 93.7  PLT 186 154 117*  Basic Metabolic Panel: Recent Labs  Lab 08/05/21 0433 08/06/21 0616 08/07/21 0428  NA 136  --  138  K 3.8  --  4.5  CL 107  --  108  CO2 25  --  27  GLUCOSE 90  --  94  BUN 14  --  14  CREATININE 0.61 0.81 0.66  CALCIUM 7.8*  --  8.0*  MG 2.6*  --   --    GFR: Estimated Creatinine Clearance: 49.7 mL/min (by C-G formula based on SCr of 0.66 mg/dL). Liver Function Tests: No results for input(s): AST, ALT, ALKPHOS, BILITOT, PROT, ALBUMIN in the last 168 hours. No results for input(s): LIPASE, AMYLASE in the last 168 hours. No results for input(s): AMMONIA in the last 168 hours. Coagulation Profile: Recent Labs  Lab 08/07/21 0428  08/08/21 0630 08/09/21 0525 08/10/21 0616 08/11/21 0644  INR 3.0* 3.2* 3.3* 3.2* 3.0*   Cardiac Enzymes: No results for input(s): CKTOTAL, CKMB, CKMBINDEX, TROPONINI in the last 168 hours. BNP (last 3 results) No results for input(s): PROBNP in the last 8760 hours. HbA1C: No results for input(s): HGBA1C in the last 72 hours. CBG: No results for input(s): GLUCAP in the last 168 hours.  Lipid Profile: No results for input(s): CHOL, HDL, LDLCALC, TRIG, CHOLHDL, LDLDIRECT in the last 72 hours. Thyroid Function Tests: No results for input(s): TSH, T4TOTAL, FREET4, T3FREE, THYROIDAB in the last 72 hours. Anemia Panel: No results for input(s): VITAMINB12, FOLATE, FERRITIN, TIBC, IRON, RETICCTPCT in the last 72 hours. Sepsis Labs: No results for input(s): PROCALCITON, LATICACIDVEN in the last 168 hours.   No results found for this or any previous visit (from the past 240 hour(s)).         Radiology Studies: No results found.      Scheduled Meds:  (feeding supplement) PROSource Plus  30 mL Oral BID BM   atorvastatin  80 mg Oral Daily   collagenase   Topical Daily   donepezil  5 mg Oral QHS   feeding supplement  237 mL Oral TID BM   ferrous sulfate  325 mg Oral Q breakfast   fluticasone furoate-vilanterol  1 puff Inhalation Daily   gabapentin  600 mg Oral BID   levothyroxine  25 mcg Oral Q0600   metoprolol succinate  12.5 mg Oral Daily   multivitamin with minerals  1 tablet Oral Daily   polyethylene glycol  17 g Oral Daily   warfarin  5 mg Oral ONCE-1600   Warfarin - Pharmacist Dosing Inpatient   Does not apply q1600   Continuous Infusions:      LOS: 16 days     Enzo Bi, MD [facility]   If 7PM-7AM, please contact night-coverage [email or web address]  08/11/2021, 3:46 PM

## 2021-08-11 NOTE — Progress Notes (Signed)
ANTICOAGULATION CONSULT NOTE  Pharmacy Consult for warfarin Indication:  mechanical valve  Patient Measurements: Height: 5\' 2"  (157.5 cm) Weight: 83.9 kg (184 lb 15.5 oz) IBW/kg (Calculated) : 50.1  Vital Signs: Temp: 99.2 F (37.3 C) (01/21 0731) Temp Source: Oral (01/21 0731) BP: 137/79 (01/21 0738) Pulse Rate: 87 (01/21 0738)  Labs: Recent Labs    08/09/21 0525 08/10/21 0616 08/11/21 0644  HGB  --  8.9*  --   HCT  --  28.1*  --   PLT  --  117*  --   LABPROT 33.8* 32.7* 31.4*  INR 3.3* 3.2* 3.0*     Estimated Creatinine Clearance: 49.7 mL/min (by C-G formula based on SCr of 0.66 mg/dL).   Medical History: Past Medical History:  Diagnosis Date   CHF (congestive heart failure) (HCC)    COPD (chronic obstructive pulmonary disease) (HCC)    Coronary artery disease    Dementia (HCC)    Diabetes mellitus without complication ([place])    Hyperlipidemia    Hypertension     Assessment: 88 year old female with altered mental status. Patient presented with INR 3.6 that has since trended up. Patient on warfarin for mechanical valve. Per review of Care Everywhere, it looks like she has had several supratherapeutic INRs in the past month.  Home warfarin regimen: 5 mg daily  Date INR Warfarin Dose  1/6 5.9 HELD  1/7 5.5 HELD  1/8 5.9 HELD  1/9 4.9 HELD  1/10 3.3 5 mg  1/11 2.4 6 mg  1/12 2.2 6 mg  1/13 2.1 7.5 mg   1/14 2.4 6 mg  1/15 2.8 5 mg  1/16 2.9 5 mg  1/17 3.0 5 mg  1/18 3.2 5 mg  1/19 3.3 3 mg  1/20 3.2 5 mg  1/21 3.0     Goal of Therapy:  INR 2.5-3.5 Monitor platelets by anticoagulation protocol: Yes   Plan:  INR therapeutic Continue warfarin 5 mg Daily INR ordered. CBC at least every 3 days.  Tawnya Crook, PharmD, BCPS Clinical Pharmacist 08/11/2021 10:50 AM

## 2021-08-12 DIAGNOSIS — L97312 Non-pressure chronic ulcer of right ankle with fat layer exposed: Secondary | ICD-10-CM | POA: Diagnosis not present

## 2021-08-12 LAB — PROTIME-INR
INR: 3.2 — ABNORMAL HIGH (ref 0.8–1.2)
Prothrombin Time: 32.4 seconds — ABNORMAL HIGH (ref 11.4–15.2)

## 2021-08-12 MED ORDER — WARFARIN SODIUM 3 MG PO TABS
3.0000 mg | ORAL_TABLET | Freq: Once | ORAL | Status: AC
Start: 2021-08-12 — End: 2021-08-12
  Administered 2021-08-12: 3 mg via ORAL
  Filled 2021-08-12: qty 1

## 2021-08-12 NOTE — Progress Notes (Signed)
PROGRESS NOTE    Rhonda Saunders  ACZ:[id number] DOB: 1932/06/02 DOA: 07/26/2021 PCP: Adin Hector, MD   Brief Narrative: 88 year old with past medical history significant for hypertension, CAD, diastolic heart failure, aortic valve replacement on Coumadin, asthma, diabetes, dementia, chronic pain, ischial decubitus ulcer, chronic venous stasis ulcer lower extremity on Unna boot, who presents to the ED complaining of weakness, full odor from wounds, concern for UTI.  Patient was also noted to be having altered mental status.   Patient admitted with infection decubitus wound and lower extremity.  She received 10 days course of IV antibiotics.  Recommendation was for lower extremity amputation, family has decided not to proceed with surgery.  Patient  is currently awaiting skilled nursing facility for long-term care, she will require palliative care follow-up at the facility.  Assessment & Plan:   Principal Problem:   Sepsis ([place]) Active Problems:   Coronary artery disease   Chronic diastolic CHF (congestive heart failure) (HCC)   Essential hypertension   Hx of aortic valve replacement, mechanical   Anticoagulated on warfarin   Senile dementia without behavioral disturbance (HCC)   Type 2 diabetes, diet controlled (HCC)   Chronic pain syndrome   Lower limb ulcer, ankle, right, with fat layer exposed ([place])   Decubitus ulcer, infected, unspecified pressure ulcer stage   Cellulitis   COPD (chronic obstructive pulmonary disease) (HCC)   Stage 3a chronic kidney disease (HCC)   Supratherapeutic INR   Acute metabolic encephalopathy   Functional quadriplegia (HCC)   Cellulitis of multiple sites of buttock   1-Decubitus ulcer buttock, unspecified pressure ulcer stage, cellulitis of buttock: -Patient presented with leukocytosis and lactic acidosis, no fevers or tachycardia. -Treated with  vancomycin  ceftriaxone. Completed 10 days, antibiotics.  -Wound care consulted. surgery  consulted. -Continue with wound care.   2-Right heel unstageable pressure ulcer, Right lower leg venous stasis unstageable ulcer Surgery  recommend amputation. Palliative care consulted for goals of care. Family has decide not to proceed with surgery.  Plan to transfer to SNF for long term care and palliative care follow up/.   Acute metabolic encephalopathy: Patient presented with altered mental status and lethargy. Probably related to medications, opioid and gabapentin OxyContin reduced to 10 mg twice daily as needed Gabapentin reduced to home dose Alert, follows command.   History of aortic valve replacement, mechanical Supra therapeutic INR Coumadin per pharmacy INR; 2.0 --2.2--2.4--2.9--3.0  Acute on chronic anemia, from blood loss Related to multiple blood drawn, bleeding from skin tears Hb down to 6.9. received 1 unit of packed red blood cell 1/11 Started  oral iron. Hb stable.   Skin tear of her left hand bleeding Exacerbated by swelling and supratherapeutic INR Applied Surgicel and compression dressing with Ace.  Functional Quadriplegia, Obesity: Need long term care.   Chronic diastolic heart failure: Appears euvolemic. CAD: Continue with statin.  Hypertension: Continue with Toprol.  Senile dementia without behavioral disturbance continue with Aricept.  Diabetes type 2: A1c 5.8. SSI.  Chronic Pain syndrome Neuropathic pain. Continue OxyContin 10 mg twice daily as needed. Complaints of pain, Added  low dose Oxycodone PRN.   COPD: Continue with albuterol as needed. Stage IIIa CKD: Monitor renal function Sepsis was ruled out.  Pressure Injury Stage II -  Partial thickness loss of dermis presenting as a shallow open ulcer with a red, pink wound bed without slough. Patient have small stage 2 (Active)     Location: Buttocks  Location Orientation: Left  Staging: Stage II -  Partial thickness loss of dermis presenting as a shallow open ulcer with a red, pink wound  bed without slough.  Wound Description (Comments): Patient have small stage 2  Present on Admission: Yes     Pressure Injury 07/27/21 Heel Right Stage 3 -  Full thickness tissue loss. Subcutaneous fat may be visible but bone, tendon or muscle are NOT exposed. (Active)  07/27/21 0612  Location: Heel  Location Orientation: Right  Staging: Stage 3 -  Full thickness tissue loss. Subcutaneous fat may be visible but bone, tendon or muscle are NOT exposed.  Wound Description (Comments):   Present on Admission:      Pressure Injury 07/27/21 Buttocks Bilateral Deep Tissue Pressure Injury - Purple or maroon localized area of discolored intact skin or blood-filled blister due to damage of underlying soft tissue from pressure and/or shear. (Active)  07/27/21 0614  Location: Buttocks  Location Orientation: Bilateral  Staging: Deep Tissue Pressure Injury - Purple or maroon localized area of discolored intact skin or blood-filled blister due to damage of underlying soft tissue from pressure and/or shear.  Wound Description (Comments):   Present on Admission:      Pressure Injury 08/08/21 Groin Right;Left;Medial Stage 2 -  Partial thickness loss of dermis presenting as a shallow open injury with a red, pink wound bed without slough. (Active)  08/08/21 2130  Location: Groin  Location Orientation: Right;Left;Medial  Staging: Stage 2 -  Partial thickness loss of dermis presenting as a shallow open injury with a red, pink wound bed without slough.  Wound Description (Comments):   Present on Admission:      Nutrition Problem: Increased nutrient needs Etiology: wound healing    Signs/Symptoms: estimated needs    Interventions: MVI, Juven, Premier Protein  Estimated body mass index is 33.83 kg/m as calculated from the following:   Height as of this encounter: 5\' 2"  (1.575 m).   Weight as of this encounter: 83.9 kg.   DVT prophylaxis: OA:[id number] Code Status: DNR  Family Communication:   Status is: inpatient Dispo:   The patient is from: home Anticipated d/c is to: SNF Anticipated d/c date is: whenever bed available  Patient currently is medically stable to d/c.   Subjective: Pt became distressed when being turned and getting wound dressing changed.  Not eating much.   Objective: Vitals:   08/12/21 0608 08/12/21 0736 08/12/21 1224 08/12/21 1621  BP: (!) 156/59 (!) 133/56 93/76 135/71  Pulse: 76 77 71 74  Resp: 18 16 14 16   Temp: 16.0 F (10.9 C) 99.5 F (37.5 C) 98.8 F (37.1 C) 98.7 F (37.1 C)  TempSrc: Oral Oral Oral Oral  SpO2: 94% 94% 96% 98%  Weight:      Height:        Intake/Output Summary (Last 24 hours) at 08/12/2021 1715 Last data filed at 08/12/2021 1700 Gross per 24 hour  Intake 0 ml  Output 750 ml  Net -750 ml   Filed Weights   07/26/21 0301  Weight: 83.9 kg    Examination:  Constitutional: in mild distress due to being turned and getting changed, oriented to self and place HEENT: conjunctivae and lids normal, EOMI CV: No cyanosis.   RESP: normal respiratory effort, on RA   Data Reviewed: I have personally reviewed following labs and imaging studies  CBC: Recent Labs  Lab 08/07/21 0428 08/10/21 0616  WBC 7.5 6.7  HGB 8.0* 8.9*  HCT 25.8* 28.1*  MCV 93.5 93.7  PLT 154 117*   Basic  Metabolic Panel: Recent Labs  Lab 08/06/21 0616 08/07/21 0428  NA  --  138  K  --  4.5  CL  --  108  CO2  --  27  GLUCOSE  --  94  BUN  --  14  CREATININE 0.81 0.66  CALCIUM  --  8.0*   GFR: Estimated Creatinine Clearance: 49.7 mL/min (by C-G formula based on SCr of 0.66 mg/dL). Liver Function Tests: No results for input(s): AST, ALT, ALKPHOS, BILITOT, PROT, ALBUMIN in the last 168 hours. No results for input(s): LIPASE, AMYLASE in the last 168 hours. No results for input(s): AMMONIA in the last 168 hours. Coagulation Profile: Recent Labs  Lab 08/08/21 0630 08/09/21 0525 08/10/21 0616 08/11/21 0644 08/12/21 0616  INR  3.2* 3.3* 3.2* 3.0* 3.2*   Cardiac Enzymes: No results for input(s): CKTOTAL, CKMB, CKMBINDEX, TROPONINI in the last 168 hours. BNP (last 3 results) No results for input(s): PROBNP in the last 8760 hours. HbA1C: No results for input(s): HGBA1C in the last 72 hours. CBG: No results for input(s): GLUCAP in the last 168 hours.  Lipid Profile: No results for input(s): CHOL, HDL, LDLCALC, TRIG, CHOLHDL, LDLDIRECT in the last 72 hours. Thyroid Function Tests: No results for input(s): TSH, T4TOTAL, FREET4, T3FREE, THYROIDAB in the last 72 hours. Anemia Panel: No results for input(s): VITAMINB12, FOLATE, FERRITIN, TIBC, IRON, RETICCTPCT in the last 72 hours. Sepsis Labs: No results for input(s): PROCALCITON, LATICACIDVEN in the last 168 hours.   No results found for this or any previous visit (from the past 240 hour(s)).         Radiology Studies: No results found.      Scheduled Meds:  (feeding supplement) PROSource Plus  30 mL Oral BID BM   atorvastatin  80 mg Oral Daily   collagenase   Topical Daily   donepezil  5 mg Oral QHS   feeding supplement  237 mL Oral TID BM   ferrous sulfate  325 mg Oral Q breakfast   fluticasone furoate-vilanterol  1 puff Inhalation Daily   gabapentin  600 mg Oral BID   levothyroxine  25 mcg Oral Q0600   metoprolol succinate  12.5 mg Oral Daily   multivitamin with minerals  1 tablet Oral Daily   polyethylene glycol  17 g Oral Daily   Warfarin - Pharmacist Dosing Inpatient   Does not apply q1600   Continuous Infusions:      LOS: 17 days     Enzo Bi, MD [facility]   If 7PM-7AM, please contact night-coverage [email or web address]  08/12/2021, 5:15 PM

## 2021-08-12 NOTE — Progress Notes (Signed)
ANTICOAGULATION CONSULT NOTE  Pharmacy Consult for warfarin Indication:  mechanical valve  Patient Measurements: Height: 5\' 2"  (157.5 cm) Weight: 83.9 kg (184 lb 15.5 oz) IBW/kg (Calculated) : 50.1  Vital Signs: Temp: 99.5 F (37.5 C) (01/22 0736) Temp Source: Oral (01/22 0736) BP: 133/56 (01/22 0736) Pulse Rate: 77 (01/22 0736)  Labs: Recent Labs    08/10/21 0616 08/11/21 0644 08/12/21 0616  HGB 8.9*  --   --   HCT 28.1*  --   --   PLT 117*  --   --   LABPROT 32.7* 31.4* 32.4*  INR 3.2* 3.0* 3.2*     Estimated Creatinine Clearance: 49.7 mL/min (by C-G formula based on SCr of 0.66 mg/dL).   Medical History: Past Medical History:  Diagnosis Date   CHF (congestive heart failure) (HCC)    COPD (chronic obstructive pulmonary disease) (HCC)    Coronary artery disease    Dementia (HCC)    Diabetes mellitus without complication ([place])    Hyperlipidemia    Hypertension     Assessment: 88 year old female with altered mental status. Patient presented with INR 3.6 that has since trended up. Patient on warfarin for mechanical valve. Per review of Care Everywhere, it looks like she has had several supratherapeutic INRs in the past month.  Home warfarin regimen: 5 mg daily  Date INR Warfarin Dose  1/6 5.9 HELD  1/7 5.5 HELD  1/8 5.9 HELD  1/9 4.9 HELD  1/10 3.3 5 mg  1/11 2.4 6 mg  1/12 2.2 6 mg  1/13 2.1 7.5 mg   1/14 2.4 6 mg  1/15 2.8 5 mg  1/16 2.9 5 mg  1/17 3.0 5 mg  1/18 3.2 5 mg  1/19 3.3 3 mg  1/20 3.2 5 mg  1/21 3.0 5 mg  1/22 3.2 3 mg    Goal of Therapy:  INR 2.5-3.5 Monitor platelets by anticoagulation protocol: Yes   Plan:  INR therapeutic, trending up Warfarin 3 mg today Daily INR ordered. CBC at least every 3 days.  Tawnya Crook, PharmD, BCPS Clinical Pharmacist 08/12/2021 11:45 AM

## 2021-08-13 DIAGNOSIS — L97312 Non-pressure chronic ulcer of right ankle with fat layer exposed: Secondary | ICD-10-CM | POA: Diagnosis not present

## 2021-08-13 LAB — CBC
HCT: 26.8 % — ABNORMAL LOW (ref 36.0–46.0)
Hemoglobin: 8.4 g/dL — ABNORMAL LOW (ref 12.0–15.0)
MCH: 29.2 pg (ref 26.0–34.0)
MCHC: 31.3 g/dL (ref 30.0–36.0)
MCV: 93.1 fL (ref 80.0–100.0)
Platelets: 104 10*3/uL — ABNORMAL LOW (ref 150–400)
RBC: 2.88 MIL/uL — ABNORMAL LOW (ref 3.87–5.11)
RDW: 19.6 % — ABNORMAL HIGH (ref 11.5–15.5)
WBC: 7.6 10*3/uL (ref 4.0–10.5)
nRBC: 0 % (ref 0.0–0.2)

## 2021-08-13 LAB — PROTIME-INR
INR: 3.4 — ABNORMAL HIGH (ref 0.8–1.2)
Prothrombin Time: 34.6 seconds — ABNORMAL HIGH (ref 11.4–15.2)

## 2021-08-13 NOTE — Progress Notes (Signed)
PROGRESS NOTE    Rhonda Saunders  DZH:[id number] DOB: 03-Jan-1932 DOA: 07/26/2021 PCP: Adin Hector, MD   Brief Narrative: 88 year old with past medical history significant for hypertension, CAD, diastolic heart failure, aortic valve replacement on Coumadin, asthma, diabetes, dementia, chronic pain, ischial decubitus ulcer, chronic venous stasis ulcer lower extremity on Unna boot, who presents to the ED complaining of weakness, full odor from wounds, concern for UTI.  Patient was also noted to be having altered mental status.   Patient admitted with infection decubitus wound and lower extremity.  She received 10 days course of IV antibiotics.  Recommendation was for lower extremity amputation, family has decided not to proceed with surgery.  Patient  is currently awaiting skilled nursing facility for long-term care, she will require palliative care follow-up at the facility.  Assessment & Plan:   Principal Problem:   Sepsis ([employer]) Active Problems:   Coronary artery disease   Chronic diastolic CHF (congestive heart failure) (HCC)   Essential hypertension   Hx of aortic valve replacement, mechanical   Anticoagulated on warfarin   Senile dementia without behavioral disturbance (HCC)   Type 2 diabetes, diet controlled (HCC)   Chronic pain syndrome   Lower limb ulcer, ankle, right, with fat layer exposed ([place])   Decubitus ulcer, infected, unspecified pressure ulcer stage   Cellulitis   COPD (chronic obstructive pulmonary disease) (HCC)   Stage 3a chronic kidney disease (HCC)   Supratherapeutic INR   Acute metabolic encephalopathy   Functional quadriplegia (HCC)   Cellulitis of multiple sites of buttock   1-Decubitus ulcer buttock, unspecified pressure ulcer stage, cellulitis of buttock: -Patient presented with leukocytosis and lactic acidosis, no fevers or tachycardia. -Treated with  vancomycin  ceftriaxone. Completed 10 days, antibiotics.  -Wound care consulted. surgery  consulted. -Continue with wound care.   2-Right heel unstageable pressure ulcer, Right lower leg venous stasis unstageable ulcer Surgery  recommend amputation. Palliative care consulted for goals of care. Family has decide not to proceed with surgery.  Plan to transfer to SNF for long term care and palliative care follow up/.   Acute metabolic encephalopathy: Patient presented with altered mental status and lethargy. Probably related to medications, opioid and gabapentin OxyContin reduced to 10 mg twice daily as needed Gabapentin reduced to home dose Alert, follows command.   History of aortic valve replacement, mechanical Supra therapeutic INR Coumadin per pharmacy INR; 2.0 --2.2--2.4--2.9--3.0  Acute on chronic anemia, from blood loss Related to multiple blood drawn, bleeding from skin tears Hb down to 6.9. received 1 unit of packed red blood cell 1/11 Started  oral iron. Hb stable.   Skin tear of her left hand bleeding Exacerbated by swelling and supratherapeutic INR Applied Surgicel and compression dressing with Ace.  Functional Quadriplegia, Obesity: Need long term care.   Chronic diastolic heart failure: Appears euvolemic. CAD: Continue with statin.  Hypertension: Continue with Toprol.  Senile dementia without behavioral disturbance continue with Aricept.  Diabetes type 2: A1c 5.8. SSI.  Chronic Pain syndrome Neuropathic pain. Continue OxyContin 10 mg twice daily as needed. Complaints of pain, Added  low dose Oxycodone PRN.   COPD: Continue with albuterol as needed. Stage IIIa CKD: Monitor renal function Sepsis was ruled out.  Pressure Injury Stage II -  Partial thickness loss of dermis presenting as a shallow open ulcer with a red, pink wound bed without slough. Patient have small stage 2 (Active)     Location: Buttocks  Location Orientation: Left  Staging: Stage II -  Partial thickness loss of dermis presenting as a shallow open ulcer with a red, pink wound  bed without slough.  Wound Description (Comments): Patient have small stage 2  Present on Admission: Yes     Pressure Injury 07/27/21 Heel Right Stage 3 -  Full thickness tissue loss. Subcutaneous fat may be visible but bone, tendon or muscle are NOT exposed. (Active)  07/27/21 0612  Location: Heel  Location Orientation: Right  Staging: Stage 3 -  Full thickness tissue loss. Subcutaneous fat may be visible but bone, tendon or muscle are NOT exposed.  Wound Description (Comments):   Present on Admission:      Pressure Injury 07/27/21 Buttocks Bilateral Deep Tissue Pressure Injury - Purple or maroon localized area of discolored intact skin or blood-filled blister due to damage of underlying soft tissue from pressure and/or shear. (Active)  07/27/21 0614  Location: Buttocks  Location Orientation: Bilateral  Staging: Deep Tissue Pressure Injury - Purple or maroon localized area of discolored intact skin or blood-filled blister due to damage of underlying soft tissue from pressure and/or shear.  Wound Description (Comments):   Present on Admission:      Pressure Injury 08/08/21 Groin Right;Left;Medial Stage 2 -  Partial thickness loss of dermis presenting as a shallow open injury with a red, pink wound bed without slough. (Active)  08/08/21 2130  Location: Groin  Location Orientation: Right;Left;Medial  Staging: Stage 2 -  Partial thickness loss of dermis presenting as a shallow open injury with a red, pink wound bed without slough.  Wound Description (Comments):   Present on Admission:      Nutrition Problem: Increased nutrient needs Etiology: wound healing    Signs/Symptoms: estimated needs    Interventions: MVI, Juven, Premier Protein  Estimated body mass index is 33.83 kg/m as calculated from the following:   Height as of this encounter: 5\' 2"  (1.575 m).   Weight as of this encounter: 83.9 kg.   DVT prophylaxis: [id number] Code Status: DNR  Family Communication:   Status is: inpatient Dispo:   The patient is from: home Anticipated d/c is to: SNF Anticipated d/c date is: whenever bed available  Patient currently is medically stable to d/c.   Subjective: No acute event.    Son requested meeting in person today, however, was not able to come in the end.     Objective: Vitals:   08/12/21 1621 08/12/21 2100 08/13/21 0417 08/13/21 0820  BP: 135/71 (!) 148/64 (!) 145/68 (!) 141/64  Pulse: 74 80 76 82  Resp: 16 18 16 20   Temp: 98.7 F (37.1 C) 99 F (37.2 C) 99.1 F (37.3 C) 98.2 F (36.8 C)  TempSrc: Oral Oral Oral Oral  SpO2: 98% 95% 95% 96%  Weight:      Height:        Intake/Output Summary (Last 24 hours) at 08/13/2021 1459 Last data filed at 08/13/2021 1413 Gross per 24 hour  Intake 0 ml  Output 750 ml  Net -750 ml   Filed Weights   07/26/21 0301  Weight: 83.9 kg    Examination:  Constitutional: NAD CV: No cyanosis.   RESP: normal respiratory effort, on RA Extremities: RLE wrapped.   Data Reviewed: I have personally reviewed following labs and imaging studies  CBC: Recent Labs  Lab 08/07/21 0428 08/10/21 0616 08/13/21 0526  WBC 7.5 6.7 7.6  HGB 8.0* 8.9* 8.4*  HCT 25.8* 28.1* 26.8*  MCV 93.5 93.7 93.1  PLT 154 117* 097*   Basic Metabolic  Panel: Recent Labs  Lab 08/07/21 0428  NA 138  K 4.5  CL 108  CO2 27  GLUCOSE 94  BUN 14  CREATININE 0.66  CALCIUM 8.0*   GFR: Estimated Creatinine Clearance: 49.7 mL/min (by C-G formula based on SCr of 0.66 mg/dL). Liver Function Tests: No results for input(s): AST, ALT, ALKPHOS, BILITOT, PROT, ALBUMIN in the last 168 hours. No results for input(s): LIPASE, AMYLASE in the last 168 hours. No results for input(s): AMMONIA in the last 168 hours. Coagulation Profile: Recent Labs  Lab 08/09/21 0525 08/10/21 0616 08/11/21 0644 08/12/21 0616 08/13/21 0526  INR 3.3* 3.2* 3.0* 3.2* 3.4*   Cardiac Enzymes: No results for input(s): CKTOTAL, CKMB, CKMBINDEX,  TROPONINI in the last 168 hours. BNP (last 3 results) No results for input(s): PROBNP in the last 8760 hours. HbA1C: No results for input(s): HGBA1C in the last 72 hours. CBG: No results for input(s): GLUCAP in the last 168 hours.  Lipid Profile: No results for input(s): CHOL, HDL, LDLCALC, TRIG, CHOLHDL, LDLDIRECT in the last 72 hours. Thyroid Function Tests: No results for input(s): TSH, T4TOTAL, FREET4, T3FREE, THYROIDAB in the last 72 hours. Anemia Panel: No results for input(s): VITAMINB12, FOLATE, FERRITIN, TIBC, IRON, RETICCTPCT in the last 72 hours. Sepsis Labs: No results for input(s): PROCALCITON, LATICACIDVEN in the last 168 hours.   No results found for this or any previous visit (from the past 240 hour(s)).         Radiology Studies: No results found.      Scheduled Meds:  (feeding supplement) PROSource Plus  30 mL Oral BID BM   atorvastatin  80 mg Oral Daily   collagenase   Topical Daily   donepezil  5 mg Oral QHS   feeding supplement  237 mL Oral TID BM   ferrous sulfate  325 mg Oral Q breakfast   fluticasone furoate-vilanterol  1 puff Inhalation Daily   gabapentin  600 mg Oral BID   levothyroxine  25 mcg Oral Q0600   metoprolol succinate  12.5 mg Oral Daily   multivitamin with minerals  1 tablet Oral Daily   polyethylene glycol  17 g Oral Daily   Warfarin - Pharmacist Dosing Inpatient   Does not apply q1600   Continuous Infusions:      LOS: 18 days     Enzo Bi, MD [facility]   If 7PM-7AM, please contact night-coverage [email or web address]  08/13/2021, 2:59 PM

## 2021-08-13 NOTE — Progress Notes (Signed)
ANTICOAGULATION CONSULT NOTE  Pharmacy Consult for warfarin Indication:  mechanical valve  Patient Measurements: Height: 5\' 2"  (157.5 cm) Weight: 83.9 kg (184 lb 15.5 oz) IBW/kg (Calculated) : 50.1  Vital Signs: Temp: 99.1 F (37.3 C) (01/23 0417) Temp Source: Oral (01/23 0417) BP: 145/68 (01/23 0417) Pulse Rate: 76 (01/23 0417)  Labs: Recent Labs    08/11/21 0644 08/12/21 0616 08/13/21 0526  HGB  --   --  8.4*  HCT  --   --  26.8*  PLT  --   --  104*  LABPROT 31.4* 32.4* 34.6*  INR 3.0* 3.2* 3.4*     Estimated Creatinine Clearance: 49.7 mL/min (by C-G formula based on SCr of 0.66 mg/dL).   Medical History: Past Medical History:  Diagnosis Date   CHF (congestive heart failure) (HCC)    COPD (chronic obstructive pulmonary disease) (HCC)    Coronary artery disease    Dementia (HCC)    Diabetes mellitus without complication ([place])    Hyperlipidemia    Hypertension     Assessment: 87 year old female with altered mental status. Patient on warfarin for mechanical valve. Per review of Care Everywhere, it looks like she has had several supratherapeutic INRs in the past month.  Home warfarin regimen: 5 mg daily  Date INR Warfarin Dose  1/6 5.9 HELD  1/7 5.5 HELD  1/8 5.9 HELD  1/9 4.9 HELD  1/10 3.3 5 mg  1/11 2.4 6 mg  1/12 2.2 6 mg  1/13 2.1 7.5 mg   1/14 2.4 6 mg  1/15 2.8 5 mg  1/16 2.9 5 mg  1/17 3.0 5 mg  1/18 3.2 5 mg  1/19 3.3 3 mg  1/20 3.2 5 mg  1/21 3.0 5 mg  1/22 3.2 3 mg  1/23 3.4 1 mg    Goal of Therapy:  INR 2.5-3.5 Monitor platelets by anticoagulation protocol: Yes   Plan:  INR therapeutic since January/15th and now trending up. Noted patient received 31mg  in the last 7 days.Will drop dose to 1mg  today. Noted patient received 1 units of PRBCs on 1/11. Hemoglobin remains stable since with Hgb =8.4 today. Continue daily INR as ordered by MD.  Pharmacy will follow morning labs and adjust warfarin as needed.  Tyller Bowlby Rodriguez-Guzman  PharmD, BCPS 08/13/2021 7:41 AM

## 2021-08-13 NOTE — TOC Progression Note (Signed)
Transition of Care [facility]) - Progression Note    Patient Details  Name: DAYSHIA BALLINAS MRN: [id number] Date of Birth: 01-Oct-1931  Transition of Care [facility]) CM/SW [place], LCSW Phone Number: 08/13/2021, 10:35 AM  Clinical Narrative:  [facility] admissions coordinator is having nursing review referral to see if they can bring her in under her Medicare. If so they will also complete asset search to determine if she'll qualify for LTC Medicaid.  Expected Discharge Plan: [place] Barriers to Discharge: Continued Medical Work up  Expected Discharge Plan and Services Expected Discharge Plan: [place] Choice: [place] arrangements for the past 2 months: Single Family Home                                       Social Determinants of Health (SDOH) Interventions    Readmission Risk Interventions No flowsheet data found.

## 2021-08-14 DIAGNOSIS — R627 Adult failure to thrive: Secondary | ICD-10-CM

## 2021-08-14 DIAGNOSIS — Z7189 Other specified counseling: Secondary | ICD-10-CM | POA: Diagnosis not present

## 2021-08-14 DIAGNOSIS — Z515 Encounter for palliative care: Secondary | ICD-10-CM | POA: Diagnosis not present

## 2021-08-14 DIAGNOSIS — T148XXA Other injury of unspecified body region, initial encounter: Secondary | ICD-10-CM | POA: Diagnosis not present

## 2021-08-14 DIAGNOSIS — L97312 Non-pressure chronic ulcer of right ankle with fat layer exposed: Secondary | ICD-10-CM | POA: Diagnosis not present

## 2021-08-14 LAB — BASIC METABOLIC PANEL
Anion gap: 7 (ref 5–15)
BUN: 16 mg/dL (ref 8–23)
CO2: 25 mmol/L (ref 22–32)
Calcium: 8.1 mg/dL — ABNORMAL LOW (ref 8.9–10.3)
Chloride: 105 mmol/L (ref 98–111)
Creatinine, Ser: 0.73 mg/dL (ref 0.44–1.00)
GFR, Estimated: >60 mL/min (ref >60)
Glucose, Bld: 132 mg/dL — ABNORMAL HIGH (ref 70–99)
Potassium: 3.9 mmol/L (ref 3.5–5.1)
Sodium: 137 mmol/L (ref 135–145)

## 2021-08-14 LAB — MAGNESIUM: Magnesium: 2.4 mg/dL (ref 1.7–2.4)

## 2021-08-14 LAB — ALBUMIN: Albumin: 2.2 g/dL — ABNORMAL LOW (ref 3.5–5.0)

## 2021-08-14 LAB — PROTIME-INR
INR: 3.7 — ABNORMAL HIGH (ref 0.8–1.2)
Prothrombin Time: 36.7 seconds — ABNORMAL HIGH (ref 11.4–15.2)

## 2021-08-14 MED ORDER — ASCORBIC ACID 500 MG PO TABS
500.0000 mg | ORAL_TABLET | Freq: Two times a day (BID) | ORAL | Status: DC
  Administered 2021-08-14 – 2021-08-16 (×4): 500 mg via ORAL
  Filled 2021-08-14 (×4): qty 1

## 2021-08-14 NOTE — Progress Notes (Signed)
Nutrition Follow-up  DOCUMENTATION CODES:   Obesity unspecified  INTERVENTION:   Ensure Enlive po TID, each supplement provides 350 kcal and 20 grams of protein  Pro-Source Plus 59ml PO BID- Each supplement provides 100kcal and 15g protein   MVI po daily   Vitamin C 500mg  po BID   Pt at high refeed risk; recommend monitor potassium, magnesium and phosphorus labs daily until stable  NUTRITION DIAGNOSIS:   Increased nutrient needs related to wound healing as evidenced by estimated needs.  GOAL:   Patient will meet greater than or equal to 90% of their needs -progressing   MONITOR:   PO intake, Supplement acceptance, Labs, Weight trends, Skin, I & O's  ASSESSMENT:   88 y/o female with h/o CAD, CHF, HTN, DM, dementia, COPD and CKD III who is admitted with AMS and sepsis.  Pt with poor appetite and oral intake in hospital; pt eating <50% of meals. Spoke with with RN, pt is taking both her ProStat Plus daily and is doing fair with her Ensure supplements. RD will add vitamin C to support wound healing. There is no new weight since admission; will request weekly weights. Plan is for SNF at discharge.   Medications reviewed and include: ferrous sulfate, synthroid, MVI, miralax, warfarin   Labs reviewed: K 3.9 wnl, Mg 2.4 wnl Hgb 8.4(L), Hct 26.8(L)  Diet Order:   Diet Order             Diet regular Room service appropriate? Yes; Fluid consistency: Thin  Diet effective now                  EDUCATION NEEDS:   Education needs have been addressed  Skin:  Skin Assessment: Skin Integrity Issues: Skin Integrity Issues:: DTI, Stage III, Other (Comment) DTI: buttocks Stage III: rt heel Other: lt hand skin tear, non-pressure wound to rt pretibial  Last BM:  1/21- type 5  Height:   Ht Readings from Last 1 Encounters:  08/08/21 5\' 2"  (1.575 m)    Weight:   Wt Readings from Last 1 Encounters:  07/26/21 83.9 kg    Ideal Body Weight:  50 kg  BMI:  Body mass  index is 33.83 kg/m.  Estimated Nutritional Needs:   Kcal:  3143-8887  Protein:  85-100 grams  Fluid:  1.3-1.5L/day  Koleen Distance MS, RD, LDN Please refer to [facility] for RD and/or RD on-call/weekend/after hours pager

## 2021-08-14 NOTE — Progress Notes (Signed)
PROGRESS NOTE    Rhonda Saunders  YQM:[id number] DOB: 1932-04-12 DOA: 07/26/2021 PCP: Adin Hector, MD   Brief Narrative: 88 year old with past medical history significant for hypertension, CAD, diastolic heart failure, aortic valve replacement on Coumadin, asthma, diabetes, dementia, chronic pain, ischial decubitus ulcer, chronic venous stasis ulcer lower extremity on Unna boot, who presented to the ED complaining of weakness, full odor from wounds, concern for UTI.  Patient was also noted to be having altered mental status.   Patient admitted with infection decubitus wound and lower extremity.  She received 10 days course of IV antibiotics.  Definitive treatment was for lower extremity amputation, but given comorbidities and predicted bleeding complication due to need to be on anticoagulation, family has decided not to proceed with surgery.  Patient is currently awaiting skilled nursing facility for long-term care, she will require palliative care follow-up at the facility.  Assessment & Plan:   Principal Problem:   Sepsis ([place]) Active Problems:   Coronary artery disease   Chronic diastolic CHF (congestive heart failure) (HCC)   Essential hypertension   Hx of aortic valve replacement, mechanical   Anticoagulated on warfarin   Senile dementia without behavioral disturbance (HCC)   Type 2 diabetes, diet controlled (HCC)   Chronic pain syndrome   Lower limb ulcer, ankle, right, with fat layer exposed ([place])   Decubitus ulcer, infected, unspecified pressure ulcer stage   Cellulitis   COPD (chronic obstructive pulmonary disease) (HCC)   Stage 3a chronic kidney disease (HCC)   Supratherapeutic INR   Acute metabolic encephalopathy   Functional quadriplegia (HCC)   Cellulitis of multiple sites of buttock   1-Decubitus ulcer buttock, unspecified pressure ulcer stage, cellulitis of buttock: -Patient presented with leukocytosis and lactic acidosis, no fevers or tachycardia. -Treated  with vancomycin  ceftriaxone. Completed 10 days, antibiotics.  -Wound care consulted. surgery consulted. -Continue with wound care.   2-Right heel unstageable pressure ulcer, Right lower leg venous stasis unstageable ulcer Surgery  recommend amputation. Palliative care consulted for goals of care. Family has decide not to proceed with surgery.  Plan to transfer to SNF for long term care and palliative care follow up/.   Acute metabolic encephalopathy: Patient presented with altered mental status and lethargy. Probably related to medications, opioid and gabapentin OxyContin reduced to 10 mg twice daily as needed Gabapentin reduced to home dose Alert, follows command.   History of aortic valve replacement, mechanical Supra therapeutic INR, POA Coumadin per pharmacy  Acute on chronic anemia, from blood loss Related to multiple blood drawn, bleeding from skin tears Hb down to 6.9. received 1 unit of packed red blood cell 1/11 Started  oral iron. Hb stable.   Skin tear of her left hand bleeding Exacerbated by swelling and supratherapeutic INR Applied Surgicel and compression dressing with Ace.  Functional Quadriplegia, Obesity: Need long term care.   Chronic diastolic heart failure: Appears euvolemic. CAD: Continue with statin.  Hypertension: Continue with Toprol.  Senile dementia without behavioral disturbance  continue with Aricept.  Diabetes type 2:  A1c 5.8.  No need for BG checks.  Chronic Pain syndrome Neuropathic pain. Continue OxyContin 10 mg twice daily as needed. Complaints of pain, Added low dose Oxycodone PRN.   COPD: Continue with albuterol as needed. Stage IIIa CKD: Monitor renal function Sepsis was ruled out.  Pressure Injury Stage II -  Partial thickness loss of dermis presenting as a shallow open ulcer with a red, pink wound bed without slough. Patient have small  stage 2 (Active)     Location: Buttocks  Location Orientation: Left  Staging: Stage II -   Partial thickness loss of dermis presenting as a shallow open ulcer with a red, pink wound bed without slough.  Wound Description (Comments): Patient have small stage 2  Present on Admission: Yes     Pressure Injury 07/27/21 Heel Right Stage 3 -  Full thickness tissue loss. Subcutaneous fat may be visible but bone, tendon or muscle are NOT exposed. (Active)  07/27/21 0612  Location: Heel  Location Orientation: Right  Staging: Stage 3 -  Full thickness tissue loss. Subcutaneous fat may be visible but bone, tendon or muscle are NOT exposed.  Wound Description (Comments):   Present on Admission:      Pressure Injury 07/27/21 Buttocks Bilateral Deep Tissue Pressure Injury - Purple or maroon localized area of discolored intact skin or blood-filled blister due to damage of underlying soft tissue from pressure and/or shear. (Active)  07/27/21 0614  Location: Buttocks  Location Orientation: Bilateral  Staging: Deep Tissue Pressure Injury - Purple or maroon localized area of discolored intact skin or blood-filled blister due to damage of underlying soft tissue from pressure and/or shear.  Wound Description (Comments):   Present on Admission:      Pressure Injury 08/08/21 Groin Right;Left;Medial Stage 2 -  Partial thickness loss of dermis presenting as a shallow open injury with a red, pink wound bed without slough. (Active)  08/08/21 2130  Location: Groin  Location Orientation: Right;Left;Medial  Staging: Stage 2 -  Partial thickness loss of dermis presenting as a shallow open injury with a red, pink wound bed without slough.  Wound Description (Comments):   Present on Admission:      Nutrition Problem: Increased nutrient needs Etiology: wound healing    Signs/Symptoms: estimated needs    Interventions: MVI, Juven, Premier Protein  Estimated body mass index is 33.83 kg/m as calculated from the following:   Height as of this encounter: 5\' 2"  (1.575 m).   Weight as of this encounter:  83.9 kg.   DVT prophylaxis: CH:ENIDPOEU Code Status: DNR  Family Communication:  Status is: inpatient Dispo:   The patient is from: home Anticipated d/c is to: LTC SNF Anticipated d/c date is: whenever bed available  Patient currently is medically stable to d/c.   Subjective: Talked with son and his wife today about prognosis.  Pt is in general decline with wound that can not be definitively treated.  Pt has poor oral intake, however, family was able to get pt to eat and drink enough so that pt is maintaining hydration and we can not predict how long she has left to live.  Palliative care provider also talked with family and helped them fill out MOST form.   Objective: Vitals:   08/14/21 0631 08/14/21 0809 08/14/21 1100 08/14/21 1554  BP: (!) 136/55 (!) 149/64 128/67 126/74  Pulse: 73 79 69 72  Resp: 16 19 19 18   Temp: 99.3 F (37.4 C) 98.8 F (37.1 C) 98.8 F (37.1 C) 98.7 F (37.1 C)  TempSrc: Oral Oral Oral Oral  SpO2: 99% 96% 98% 98%  Weight:      Height:        Intake/Output Summary (Last 24 hours) at 08/14/2021 1641 Last data filed at 08/14/2021 1555 Gross per 24 hour  Intake 360 ml  Output 150 ml  Net 210 ml   Filed Weights   07/26/21 0301  Weight: 83.9 kg    Examination:  Constitutional: NAD, sleeping CV: No cyanosis.   RESP: normal respiratory effort, on RA Extremities: RLE wrapped   Data Reviewed: I have personally reviewed following labs and imaging studies  CBC: Recent Labs  Lab 08/10/21 0616 08/13/21 0526  WBC 6.7 7.6  HGB 8.9* 8.4*  HCT 28.1* 26.8*  MCV 93.7 93.1  PLT 117* 703*   Basic Metabolic Panel: Recent Labs  Lab 08/14/21 1022  NA 137  K 3.9  CL 105  CO2 25  GLUCOSE 132*  BUN 16  CREATININE 0.73  CALCIUM 8.1*  MG 2.4   GFR: Estimated Creatinine Clearance: 49.7 mL/min (by C-G formula based on SCr of 0.73 mg/dL). Liver Function Tests: Recent Labs  Lab 08/14/21 1022  ALBUMIN 2.2*   No results for input(s):  LIPASE, AMYLASE in the last 168 hours. No results for input(s): AMMONIA in the last 168 hours. Coagulation Profile: Recent Labs  Lab 08/10/21 0616 08/11/21 0644 08/12/21 0616 08/13/21 0526 08/14/21 0524  INR 3.2* 3.0* 3.2* 3.4* 3.7*   Cardiac Enzymes: No results for input(s): CKTOTAL, CKMB, CKMBINDEX, TROPONINI in the last 168 hours. BNP (last 3 results) No results for input(s): PROBNP in the last 8760 hours. HbA1C: No results for input(s): HGBA1C in the last 72 hours. CBG: No results for input(s): GLUCAP in the last 168 hours.  Lipid Profile: No results for input(s): CHOL, HDL, LDLCALC, TRIG, CHOLHDL, LDLDIRECT in the last 72 hours. Thyroid Function Tests: No results for input(s): TSH, T4TOTAL, FREET4, T3FREE, THYROIDAB in the last 72 hours. Anemia Panel: No results for input(s): VITAMINB12, FOLATE, FERRITIN, TIBC, IRON, RETICCTPCT in the last 72 hours. Sepsis Labs: No results for input(s): PROCALCITON, LATICACIDVEN in the last 168 hours.   No results found for this or any previous visit (from the past 240 hour(s)).         Radiology Studies: No results found.      Scheduled Meds:  (feeding supplement) PROSource Plus  30 mL Oral BID BM   vitamin C  500 mg Oral BID   atorvastatin  80 mg Oral Daily   donepezil  5 mg Oral QHS   feeding supplement  237 mL Oral TID BM   ferrous sulfate  325 mg Oral Q breakfast   fluticasone furoate-vilanterol  1 puff Inhalation Daily   gabapentin  600 mg Oral BID   levothyroxine  25 mcg Oral Q0600   metoprolol succinate  12.5 mg Oral Daily   multivitamin with minerals  1 tablet Oral Daily   polyethylene glycol  17 g Oral Daily   Warfarin - Pharmacist Dosing Inpatient   Does not apply q1600   Continuous Infusions:      LOS: 19 days     Enzo Bi, MD [facility]   If 7PM-7AM, please contact night-coverage [email or web address]  08/14/2021, 4:41 PM

## 2021-08-14 NOTE — Progress Notes (Signed)
ANTICOAGULATION CONSULT NOTE  Pharmacy Consult for warfarin Indication:  mechanical valve  Patient Measurements: Height: 5\' 2"  (157.5 cm) Weight: 83.9 kg (184 lb 15.5 oz) IBW/kg (Calculated) : 50.1  Vital Signs: Temp: 99.3 F (37.4 C) (01/24 0631) Temp Source: Oral (01/24 0631) BP: 136/55 (01/24 0631) Pulse Rate: 73 (01/24 0631)  Labs: Recent Labs    08/12/21 0616 08/13/21 0526 08/14/21 0524  HGB  --  8.4*  --   HCT  --  26.8*  --   PLT  --  104*  --   LABPROT 32.4* 34.6* 36.7*  INR 3.2* 3.4* 3.7*     Estimated Creatinine Clearance: 49.7 mL/min (by C-G formula based on SCr of 0.66 mg/dL).   Medical History: Past Medical History:  Diagnosis Date   CHF (congestive heart failure) (HCC)    COPD (chronic obstructive pulmonary disease) (HCC)    Coronary artery disease    Dementia (HCC)    Diabetes mellitus without complication ([place])    Hyperlipidemia    Hypertension     Assessment: 88 year old female with altered mental status. Patient on warfarin for mechanical valve. Per review of Care Everywhere, it looks like she has had several supratherapeutic INRs in the past month.  Home warfarin regimen: 5 mg daily  Date INR Warfarin Dose  1/6 5.9 HELD  1/7 5.5 HELD  1/8 5.9 HELD  1/9 4.9 HELD  1/10 3.3 5 mg  1/11 2.4 6 mg  1/12 2.2 6 mg  1/13 2.1 7.5 mg   1/14 2.4 6 mg  1/15 2.8 5 mg  1/16 2.9 5 mg  1/17 3.0 5 mg  1/18 3.2 5 mg  1/19 3.3 3 mg  1/20 3.2 5 mg  1/21 3.0 5 mg  1/22 3.2 3 mg  1/23 3.4 1 mg  1/24 3.7         Goal of Therapy:  INR 2.5-3.5 Monitor platelets by anticoagulation protocol: Yes   Plan:  INR therapeutic since January 15th and now trending up.  Will hold dose today 1/24 Noted patient received 1 units of PRBCs on 1/11. Hemoglobin remains stable Hgb =8.4 on 1/23. Continue daily INR as ordered by MD.  Pharmacy will follow morning labs and adjust warfarin as needed.  Chinita Greenland PharmD Clinical  Pharmacist 08/14/2021

## 2021-08-14 NOTE — Progress Notes (Addendum)
Palliative:  HPI: 88 y.o. female  with past medical history of HTN, CAD, diastolic CHF, AVR (Coumadin), asthma, diabetes type 2, dementia (Aricept), chronic pain, decubitus ulcers, chronic venous stasis ulcers (Unna boots), functional quadriplegia, COPD, CKD (stage IIIa), and pituitary adenoma admitted on 07/26/2021 with AMS, weakness, and foul odor from buttocks with suspected UTI. RLE amputation declined by patient and family. Overall failure to thrive.   I met today at Rhonda Saunders' bedside after reviewing records and receiving update from RN. I spoke further with Rhonda Saunders' [name] and daughter-in-law. Family express that they see signs of decline as they witnessed in other family members. They want more information on prognosis and expectations. We discussed Rhonda Saunders' fluctuating status and that she has overall poor intake but sometimes eats fairly well (she ate 50% of her breakfast) but other times she refuses completely. I explained that Rhonda Saunders is able to maintain for now although it is unclear how long this may last. We do know that we anticipate her to have further decline but it is difficult to say when this may happen. I explained that I do not feel that she would be a candidate for hospice facility placement (this would mean prognosis < 2 weeks) but I do feel she would be a candidate for hospice at home or at a facility (meaning prognosis < 6 months likely). However, the barrier to having hospice at this time is insurance and plans for Medicare to cover her stay to rehab her wound. We discussed palliative to follow to help them transition when she declines further. We discussed adding hospice care when Medicare no longer covering rehab/facility costs. They express understanding.   I further encouraged them to consider how we handle Rhonda Saunders health when she declines further. I provided them with MOST form and encouraged that this would be helpful to complete prior to transfer to facility as this  provides clarity to the care team how to best treat Rhonda Saunders. We discussed readmission to the hospital vs utilizing services such as hospice to provide comfort. They seem to be leaning more towards comfort focused care at time of decline. I left them with MOST form to review and discuss further. I provided them with my contact info for any further questions/concerns.   All questions/concerns addressed. Emotional support provided. Updated Dr. Billie Ruddy.   Exam: Thin, frail, lying in bed. No distress. Breathing regular, unlabored. Ate ~50% breakfast. Abd flat.   Plan: - DNR in place.  - MOST provided to family for review with hopes of completion prior to d/c.  - TOC working on facility placement and recommend outpatient palliative to follow and assist with transition to hospice as indicated.   [place], NP Palliative Medicine Team Pager [phone] (Please see [email or web address] for schedule) Team Phone [phone]    Greater than 50%  of this time was spent counseling and coordinating care related to the above assessment and plan

## 2021-08-14 NOTE — TOC Progression Note (Addendum)
Transition of Care [facility]) - Progression Note    Patient Details  Name: Rhonda Saunders MRN: [id number] Date of Birth: November 09, 1931  Transition of Care [facility]) CM/SW [place], LCSW Phone Number: 08/14/2021, 9:00 AM  Clinical Narrative: Left message for [facility] admissions coordinator to see if there were any updates. Yesterday he said they could bring her in under her Medicare for wound care needs. He was completing asset check to confirm she could qualify for long-term Medicaid.    10:24 am: According to asset check, patient has multiple properties but son said this is not accurate. Asked [facility] admissions coordinator to call son and discuss.  Expected Discharge Plan: [place] Barriers to Discharge: Continued Medical Work up  Expected Discharge Plan and Services Expected Discharge Plan: [place] Choice: [place] arrangements for the past 2 months: Single Family Home                                       Social Determinants of Health (SDOH) Interventions    Readmission Risk Interventions No flowsheet data found.

## 2021-08-15 DIAGNOSIS — A419 Sepsis, unspecified organism: Secondary | ICD-10-CM | POA: Diagnosis not present

## 2021-08-15 DIAGNOSIS — L03317 Cellulitis of buttock: Secondary | ICD-10-CM | POA: Diagnosis not present

## 2021-08-15 DIAGNOSIS — G894 Chronic pain syndrome: Secondary | ICD-10-CM | POA: Diagnosis not present

## 2021-08-15 DIAGNOSIS — L8990 Pressure ulcer of unspecified site, unspecified stage: Secondary | ICD-10-CM | POA: Diagnosis not present

## 2021-08-15 LAB — CBC
HCT: 28.6 % — ABNORMAL LOW (ref 36.0–46.0)
Hemoglobin: 8.9 g/dL — ABNORMAL LOW (ref 12.0–15.0)
MCH: 29.4 pg (ref 26.0–34.0)
MCHC: 31.1 g/dL (ref 30.0–36.0)
MCV: 94.4 fL (ref 80.0–100.0)
Platelets: 121 10*3/uL — ABNORMAL LOW (ref 150–400)
RBC: 3.03 MIL/uL — ABNORMAL LOW (ref 3.87–5.11)
RDW: 19.2 % — ABNORMAL HIGH (ref 11.5–15.5)
WBC: 8.5 10*3/uL (ref 4.0–10.5)
nRBC: 0 % (ref 0.0–0.2)

## 2021-08-15 LAB — RESP PANEL BY RT-PCR (FLU A&B, COVID) ARPGX2
Influenza A by PCR: NEGATIVE
Influenza B by PCR: NEGATIVE
SARS Coronavirus 2 by RT PCR: NEGATIVE

## 2021-08-15 LAB — PROTIME-INR
INR: 3.6 — ABNORMAL HIGH (ref 0.8–1.2)
Prothrombin Time: 36.2 seconds — ABNORMAL HIGH (ref 11.4–15.2)

## 2021-08-15 MED ORDER — WARFARIN SODIUM 1 MG PO TABS
1.0000 mg | ORAL_TABLET | Freq: Once | ORAL | Status: AC
  Administered 2021-08-15: 16:00:00 1 mg via ORAL
  Filled 2021-08-15: qty 1

## 2021-08-15 NOTE — TOC Progression Note (Addendum)
Transition of Care [facility]) - Progression Note    Patient Details  Name: Rhonda Saunders MRN: [id number] Date of Birth: [birth year]  Transition of Care [facility]) CM/SW [place], LCSW Phone Number: 08/15/2021, 1:01 PM  Clinical Narrative:  Received message during progression rounds from [facility] admissions coordinator stating he had spoken to son and son had accepted their bed offer. CSW left message for admissions coordinator to see when they would be able to accept her.   2:47 pm: [facility] can [facility] patient tomorrow. Son is aware. Rapid COVID test pending.  Expected Discharge Plan: [place] Barriers to Discharge: Continued Medical Work up  Expected Discharge Plan and Services Expected Discharge Plan: [place] Choice: [place] arrangements for the past 2 months: Single Family Home                                       Social Determinants of Health (SDOH) Interventions    Readmission Risk Interventions No flowsheet data found.

## 2021-08-15 NOTE — Progress Notes (Signed)
[facility] [place] NAME: Rhonda Saunders    MR#:  [id number]  DATE OF BIRTH:  [birth year]  SUBJECTIVE:   no new complaints. No family at bedside. REVIEW OF SYSTEMS:   Review of Systems  Unable to perform ROS: Dementia  Tolerating Diet:yes Tolerating PT:   DRUG ALLERGIES:   Allergies  Allergen Reactions   Pentazocine Other (See Comments)    Other Reaction: Not Assessed     VITALS:  Blood pressure (!) 148/62, pulse 70, temperature 97.8 F (36.6 C), temperature source Oral, resp. rate 17, height 5\' 2"  (1.575 m), weight 83.9 kg, SpO2 97 %.  PHYSICAL EXAMINATION:   Physical Exam  GENERAL:  88 y.o.-year-old patient lying in the bed with no acute distress.  LUNGS: Normal breath sounds bilaterally, no wheezing, rales, rhonchi.  CARDIOVASCULAR: S1, S2 normal. No murmurs, rubs, or gallops.  ABDOMEN: Soft, nontender, nondistended. Bowel sounds present.  EXTREMITIES:LE dressing + NEUROLOGIC: nonfocal SKIN:  Pressure Injury Stage II -  Partial thickness loss of dermis presenting as a shallow open ulcer with a red, pink wound bed without slough. Patient have small stage 2 (Active)     Location: Buttocks  Location Orientation: Left  Staging: Stage II -  Partial thickness loss of dermis presenting as a shallow open ulcer with a red, pink wound bed without slough.  Wound Description (Comments): Patient have small stage 2  Present on Admission: Yes     Pressure Injury 07/27/21 Heel Right Stage 3 -  Full thickness tissue loss. Subcutaneous fat may be visible but bone, tendon or muscle are NOT exposed. (Active)  07/27/21 0612  Location: Heel  Location Orientation: Right  Staging: Stage 3 -  Full thickness tissue loss. Subcutaneous fat may be visible but bone, tendon or muscle are NOT exposed.  Wound Description (Comments):   Present on Admission:      Pressure Injury 07/27/21 Buttocks Bilateral Deep Tissue Pressure Injury - Purple or maroon  localized area of discolored intact skin or blood-filled blister due to damage of underlying soft tissue from pressure and/or shear. (Active)  07/27/21 0614  Location: Buttocks  Location Orientation: Bilateral  Staging: Deep Tissue Pressure Injury - Purple or maroon localized area of discolored intact skin or blood-filled blister due to damage of underlying soft tissue from pressure and/or shear.  Wound Description (Comments):   Present on Admission:      Pressure Injury 08/08/21 Groin Right;Left;Medial Stage 2 -  Partial thickness loss of dermis presenting as a shallow open injury with a red, pink wound bed without slough. (Active)  08/08/21 2130  Location: Groin  Location Orientation: Right;Left;Medial  Staging: Stage 2 -  Partial thickness loss of dermis presenting as a shallow open injury with a red, pink wound bed without slough.  Wound Description (Comments):   Present on Admission:         LABORATORY PANEL:  CBC Recent Labs  Lab 08/15/21 0541  WBC 8.5  HGB 8.9*  HCT 28.6*  PLT 121*    Chemistries  Recent Labs  Lab 08/14/21 1022  NA 137  K 3.9  CL 105  CO2 25  GLUCOSE 132*  BUN 16  CREATININE 0.73  CALCIUM 8.1*  MG 2.4   Cardiac Enzymes No results for input(s): TROPONINI in the last 168 hours. RADIOLOGY:  No results found. ASSESSMENT AND PLAN:  88 year old with past medical history significant for hypertension, CAD, diastolic heart failure, aortic valve replacement on Coumadin, asthma,  diabetes, dementia, chronic pain, ischial decubitus ulcer, chronic venous stasis ulcer lower extremity on Unna boot, who presented to the ED complaining of weakness, full odor from wounds, concern for UTI.  Patient was also noted to be having altered mental status.   Decubitus ulcer buttock, unspecified pressure ulcer stage, cellulitis of buttock: -Patient presented with leukocytosis and lactic acidosis, no fevers or tachycardia. -Treated with vancomycin  ceftriaxone.  Completed 10 days, antibiotics.  -Wound care consulted. surgery consulted. -Continue with wound care.    Right heel unstageable pressure ulcer, Right lower leg venous stasis unstageable ulcer --Surgery Dr Lysle Pearl  recommended amputation. --Palliative care consulted for goals of care. Family has decide not to proceed with surgery.  ---Plan to transfer to SNF for long term care and palliative care follow up   Acute metabolic encephalopathy: Patient presented with altered mental status and lethargy. --Probably related to medications, opioid and gabapentin --OxyContin reduced to 10 mg twice daily as needed --Gabapentin reduced to home dos   History of aortic valve replacement, mechanical Supra therapeutic INR, POA --Coumadin per pharmacy   Acute on chronic anemia, from blood loss --Hb down to 6.9. received 1 unit of packed red blood cell 1/11 --Started  oral iron. Hb stable.    Functional Quadriplegia, Obesity: Need long term care.    Chronic diastolic heart failure: Appears euvolemic. CAD: Continue with statin.   Hypertension: Continue with Toprol.   Senile dementia without behavioral disturbance  continue with Aricept.   Diabetes type 2:  A1c 5.8.  No need for BG checks.   Chronic Pain syndrome Neuropathic pain. Continue OxyContin 10 mg twice daily as needed. Complaints of pain, Added low dose Oxycodone PRN.    COPD: Continue with albuterol as needed. Stage IIIa CKD: Monitor renal function Sepsis was ruled out.   Pressure Injury Stage II -  Partial thickness loss of dermis presenting as a shallow open ulcer with a red, pink wound bed without slough. Patient have small stage 2 (Active)     Location: Buttocks  Location Orientation: Left  Staging: Stage II -  Partial thickness loss of dermis presenting as a shallow open ulcer with a red, pink wound bed without slough.  Wound Description (Comments): Patient have small stage 2  Present on Admission: Yes     Pressure Injury  07/27/21 Heel Right Stage 3 -  Full thickness tissue loss. Subcutaneous fat may be visible but bone, tendon or muscle are NOT exposed. (Active)  07/27/21 0612  Location: Heel  Location Orientation: Right  Staging: Stage 3 -  Full thickness tissue loss. Subcutaneous fat may be visible but bone, tendon or muscle are NOT exposed.  Wound Description (Comments):   Present on Admission:      Pressure Injury 07/27/21 Buttocks Bilateral Deep Tissue Pressure Injury - Purple or maroon localized area of discolored intact skin or blood-filled blister due to damage of underlying soft tissue from pressure and/or shear. (Active)  07/27/21 0614  Location: Buttocks  Location Orientation: Bilateral  Staging: Deep Tissue Pressure Injury - Purple or maroon localized area of discolored intact skin or blood-filled blister due to damage of underlying soft tissue from pressure and/or shear.  Wound Description (Comments):   Present on Admission:      Pressure Injury 08/08/21 Groin Right;Left;Medial Stage 2 -  Partial thickness loss of dermis presenting as a shallow open injury with a red, pink wound bed without slough. (Active)  08/08/21 2130  Location: Groin  Location Orientation: Right;Left;Medial  Staging: Stage  2 -  Partial thickness loss of dermis presenting as a shallow open injury with a red, pink wound bed without slough.  Wound Description (Comments):   Present on Admission:         Procedures: Family communication :none today Consults : CODE STATUS: DNR DVT Prophylaxis : warfarin Level of care: Med-Surg Status is: Inpatient  Remains inpatient appropriate because: patient will discharged to rehab tomorrow according to [facility]. Repeat COVID ordered        TOTAL TIME TAKING CARE OF THIS PATIENT: 20 minutes.  >50% time spent on counselling and coordination of care  Note: This dictation was prepared with Dragon dictation along with smaller phrase technology. Any transcriptional errors that  result from this process are unintentional.  Fritzi Mandes M.D    [facility]   CC: Primary care physician; Adin Hector, MD Patient ID: [name], female   DOB: Mar 02, 1932, 89 y.o.   MRN: [id number]

## 2021-08-15 NOTE — Progress Notes (Signed)
ANTICOAGULATION CONSULT NOTE  Pharmacy Consult for warfarin Indication:  mechanical valve  Patient Measurements: Height: 5\' 2"  (157.5 cm) Weight: 83.9 kg (184 lb 15.5 oz) IBW/kg (Calculated) : 50.1  Vital Signs: Temp: 98.1 F (36.7 C) (01/25 0522) Temp Source: Oral (01/25 0522) BP: 139/57 (01/25 0522) Pulse Rate: 77 (01/25 0522)  Labs: Recent Labs    08/13/21 0526 08/14/21 0524 08/14/21 1022 08/15/21 0541  HGB 8.4*  --   --  8.9*  HCT 26.8*  --   --  28.6*  PLT 104*  --   --  121*  LABPROT 34.6* 36.7*  --  36.2*  INR 3.4* 3.7*  --  3.6*  CREATININE  --   --  0.73  --      Estimated Creatinine Clearance: 49.7 mL/min (by C-G formula based on SCr of 0.73 mg/dL).   Medical History: Past Medical History:  Diagnosis Date   CHF (congestive heart failure) (HCC)    COPD (chronic obstructive pulmonary disease) (HCC)    Coronary artery disease    Dementia (HCC)    Diabetes mellitus without complication ([place])    Hyperlipidemia    Hypertension     Assessment: 88 year old female with altered mental status. Patient on warfarin for mechanical valve. Per review of Care Everywhere, it looks like she has had several supratherapeutic INRs in the past month.  Home warfarin regimen: 5 mg daily  Date INR Warfarin Dose  1/6 5.9 HELD  1/7 5.5 HELD  1/8 5.9 HELD  1/9 4.9 HELD  1/10 3.3 5 mg  1/11 2.4 6 mg  1/12 2.2 6 mg  1/13 2.1 7.5 mg   1/14 2.4 6 mg  1/15 2.8 5 mg  1/16 2.9 5 mg  1/17 3.0 5 mg  1/18 3.2 5 mg  1/19 3.3 3 mg  1/20 3.2 5 mg  1/21 3.0 5 mg  1/22 3.2 3 mg  1/23 3.4 1 mg  1/24 3.7 HELD  1/25 3.6     Goal of Therapy:  INR 2.5-3.5 Monitor platelets by anticoagulation protocol: Yes   Plan:  INR therapeutic since January 15th. Now just slightly out of range on upper end. Will order 1 mg today. Hemoglobin remains stable Hgb =8.9, plt 131 Continue daily INR as ordered by MD.  Pharmacy will follow morning labs and adjust warfarin as needed.  Chinita Greenland PharmD Clinical Pharmacist 08/15/2021

## 2021-08-16 DIAGNOSIS — F0390 Unspecified dementia without behavioral disturbance: Secondary | ICD-10-CM | POA: Diagnosis not present

## 2021-08-16 DIAGNOSIS — T148XXA Other injury of unspecified body region, initial encounter: Secondary | ICD-10-CM

## 2021-08-16 DIAGNOSIS — A419 Sepsis, unspecified organism: Secondary | ICD-10-CM | POA: Diagnosis not present

## 2021-08-16 DIAGNOSIS — N1831 Chronic kidney disease, stage 3a: Secondary | ICD-10-CM | POA: Diagnosis not present

## 2021-08-16 LAB — PROTIME-INR
INR: 3.1 — ABNORMAL HIGH (ref 0.8–1.2)
Prothrombin Time: 31.8 seconds — ABNORMAL HIGH (ref 11.4–15.2)

## 2021-08-16 MED ORDER — ENSURE ENLIVE PO LIQD: 237.0000 mL | Freq: Three times a day (TID) | ORAL | 12 refills | Status: DC

## 2021-08-16 MED ORDER — FERROUS SULFATE 325 (65 FE) MG PO TABS: 325.0000 mg | ORAL_TABLET | Freq: Every day | ORAL | 3 refills | Status: DC

## 2021-08-16 MED ORDER — ADULT MULTIVITAMIN W/MINERALS CH: 1.0000 | ORAL_TABLET | Freq: Every day | ORAL | 0 refills | Status: DC

## 2021-08-16 MED ORDER — WARFARIN SODIUM 5 MG PO TABS: 2.5000 mg | ORAL_TABLET | Freq: Every day | ORAL | 0 refills | Status: DC

## 2021-08-16 MED ORDER — ASCORBIC ACID 500 MG PO TABS: 500.0000 mg | ORAL_TABLET | Freq: Two times a day (BID) | ORAL | 0 refills | Status: DC

## 2021-08-16 MED ORDER — OXYCODONE HCL ER 10 MG PO T12A: 10.0000 mg | EXTENDED_RELEASE_TABLET | Freq: Two times a day (BID) | ORAL | 0 refills | Status: DC

## 2021-08-16 MED ORDER — PROSOURCE PLUS PO LIQD: 30.0000 mL | Freq: Two times a day (BID) | ORAL | 0 refills | Status: DC

## 2021-08-16 MED ORDER — FUROSEMIDE 40 MG PO TABS: 20.0000 mg | ORAL_TABLET | Freq: Every day | ORAL | 0 refills | Status: DC

## 2021-08-16 NOTE — Progress Notes (Signed)
ANTICOAGULATION CONSULT NOTE  Pharmacy Consult for warfarin Indication:  mechanical valve  Patient Measurements: Height: 5\' 2"  (157.5 cm) Weight: 83.9 kg (184 lb 15.5 oz) IBW/kg (Calculated) : 50.1  Vital Signs: Temp: 99.1 F (37.3 C) (01/26 0758) Temp Source: Oral (01/26 0758) BP: 141/48 (01/26 0758) Pulse Rate: 72 (01/26 0758)  Labs: Recent Labs    08/14/21 0524 08/14/21 1022 08/15/21 0541 08/16/21 0554  HGB  --   --  8.9*  --   HCT  --   --  28.6*  --   PLT  --   --  121*  --   LABPROT 36.7*  --  36.2* 31.8*  INR 3.7*  --  3.6* 3.1*  CREATININE  --  0.73  --   --      Estimated Creatinine Clearance: 49.7 mL/min (by C-G formula based on SCr of 0.73 mg/dL).   Medical History: Past Medical History:  Diagnosis Date   CHF (congestive heart failure) (HCC)    COPD (chronic obstructive pulmonary disease) (HCC)    Coronary artery disease    Dementia (HCC)    Diabetes mellitus without complication ([place])    Hyperlipidemia    Hypertension     Assessment: 88 year old female with altered mental status. Patient on warfarin for mechanical valve. Per review of Care Everywhere, it looks like she has had several supratherapeutic INRs in the past month.  Home warfarin regimen: 5 mg daily  Date INR Warfarin Dose  1/6 5.9 HELD  1/7 5.5 HELD  1/8 5.9 HELD  1/9 4.9 HELD  1/10 3.3 5 mg  1/11 2.4 6 mg  1/12 2.2 6 mg  1/13 2.1 7.5 mg   1/14 2.4 6 mg  1/15 2.8 5 mg  1/16 2.9 5 mg  1/17 3.0 5 mg  1/18 3.2 5 mg  1/19 3.3 3 mg  1/20 3.2 5 mg  1/21 3.0 5 mg  1/22 3.2 3 mg  1/23 3.4 1 mg  1/24 3.7 HELD  1/25 3.6 1mg   1/26 3.1     Goal of Therapy:  INR 2.5-3.5 Monitor platelets by anticoagulation protocol: Yes   Plan:  INR at goal today and significantly lower since yesterday (reflecting HELD dose 2 days ago). Will resume warfarin at 2.5mg  daily. Continue daily INR as ordered by MD.  Pharmacy will follow morning labs and adjust warfarin as needed.  Ivanka Kirshner  Rodriguez-Guzman PharmD, BCPS 08/16/2021 8:17 AM

## 2021-08-16 NOTE — TOC Transition Note (Signed)
Transition of Care [facility]) - CM/SW Discharge Note   Patient Details  Name: Rhonda Saunders MRN: [id number] Date of Birth: [birth year]  [facility]) CM/SW Contact:  Candie Chroman, LCSW Phone Number: 08/16/2021, 11:17 AM   Clinical Narrative:   Patient has orders to discharge to [facility] today. RN will call report to [phone] (Room 211A). EMS transport has been arranged. No further concerns. CSW signing off.  Final next level of care: Skilled Nursing Facility Barriers to Discharge: Barriers Resolved   Patient Goals and CMS Choice     Choice offered to / list presented to : Adult Children  Discharge Placement   Existing PASRR number confirmed : 08/08/21          Patient chooses bed at: [facility] Patient to be transferred to facility by: EMS Name of family member notified: [name] Patient and family notified of of transfer: 08/16/21  Discharge Plan and Services     Post Acute Care Choice: [place]                               Social Determinants of Health (SDOH) Interventions     Readmission Risk Interventions No flowsheet data found.

## 2021-08-16 NOTE — Discharge Summary (Signed)
[facility] [place] NAME: Rhonda Saunders    MR#:  [id number]  DATE OF BIRTH:  08/27/1931  DATE OF ADMISSION:  07/26/2021 ADMITTING PHYSICIAN: Athena Masse, MD  DATE OF DISCHARGE: 08/16/2021  PRIMARY CARE PHYSICIAN: Adin Hector, MD    ADMISSION DIAGNOSIS:  Cellulitis of multiple sites of buttock [L03.317] Supratherapeutic INR [R79.1] Sepsis ([place]) [A41.9] Multiple wounds of skin [T14.8XXA]  DISCHARGE DIAGNOSIS:    SECONDARY DIAGNOSIS:   Past Medical History:  Diagnosis Date   CHF (congestive heart failure) (HCC)    COPD (chronic obstructive pulmonary disease) ([place])    Coronary artery disease    Dementia (HCC)    Diabetes mellitus without complication ([place])    Hyperlipidemia    Hypertension     HOSPITAL COURSE:   88 year old with past medical history significant for hypertension, CAD, diastolic heart failure, aortic valve replacement on Coumadin, asthma, diabetes, dementia, chronic pain, ischial decubitus ulcer, chronic venous stasis ulcer lower extremity on Unna boot, who presented to the ED complaining of weakness, full odor from wounds, concern for UTI.  Patient was also noted to be having altered mental status.    Decubitus ulcer buttock, unspecified pressure ulcer stage, cellulitis of buttock: -Patient presented with leukocytosis and lactic acidosis, no fevers or tachycardia. -Treated with vancomycin  ceftriaxone. Completed 10 days, antibiotics.  -Wound care consulted. surgery consulted. -Continue with wound care.  --Sepsis was ruled out.   Right heel unstageable pressure ulcer, Right lower leg venous stasis unstageable ulcer --Surgery Dr Lysle Pearl  recommended amputation. --Palliative care consulted for goals of care. Family has decide not to proceed with surgery.  ---Plan to transfer to SNF for long term care and palliative care follow up   Acute metabolic encephalopathy: Patient presented with altered mental status and  lethargy. --Probably related to medications, opioid and gabapentin --OxyContin reduced to 10 mg twice daily (home med) --Gabapentin reduced to home dos   History of aortic valve replacement, mechanical Supra therapeutic INR, POA --Coumadin per pharmacy --coumadin 2.5 mg qpm and adjust according to INR   Acute on chronic anemia, from blood loss --Hb down to 6.9. received 1 unit of packed red blood cell 1/11 --Started  oral iron. Hgb stable.    Functional Quadriplegia, Obesity: Need long term care.    Chronic diastolic heart failure: Appears euvolemic.resumed lasix 20 mg qd and ramipril    Hypertension:  --on Toprol and Ramipril   Senile dementia without behavioral disturbance  continue with Aricept.   Diabetes type 2:  A1c 5.8.  No need for BG checks.   Chronic Pain syndrome Neuropathic pain. Continue OxyContin 10 mg twice daily    COPD: Continue with albuterol as needed.  Stage IIIa CKD: Monitor renal function  Poor long term prognosis         Family communication :Son Dellis Filbert Consults :Surgery, Palliatvie CODE STATUS: DNR DVT Prophylaxis : warfarin Level of care: Med-Surg Status is: Inpatient   Repeat COVID negative D/c to [facility] with Palliative care  CONSULTS OBTAINED:    DRUG ALLERGIES:   Allergies  Allergen Reactions   Pentazocine Other (See Comments)    Other Reaction: Not Assessed     DISCHARGE MEDICATIONS:   Allergies as of 08/16/2021       Reactions   Pentazocine Other (See Comments)   Other Reaction: Not Assessed        Medication List     STOP taking these medications  potassium chloride SA 20 MEQ tablet Commonly known as: KLOR-CON M       TAKE these medications    albuterol 108 (90 Base) MCG/ACT inhaler Commonly known as: VENTOLIN HFA Inhale 2 puffs into the lungs every 6 (six) hours as needed.   ascorbic acid 500 MG tablet Commonly known as: VITAMIN C Take 1 tablet (500 mg total) by mouth 2 (two) times  daily.   atorvastatin 80 MG tablet Commonly known as: LIPITOR Take 1 tablet by mouth daily.   donepezil 5 MG tablet Commonly known as: ARICEPT Take 5 mg by mouth at bedtime.   feeding supplement Liqd Take 237 mLs by mouth 3 (three) times daily between meals.   (feeding supplement) PROSource Plus liquid Take 30 mLs by mouth 2 (two) times daily between meals.   ferrous sulfate 325 (65 FE) MG tablet Take 1 tablet (325 mg total) by mouth daily with breakfast.   Fluticasone-Salmeterol 250-50 MCG/DOSE Aepb Commonly known as: ADVAIR INHALE 1 PUFF INTO THE LUNGS ONCE DAILY AS DIRECTED   furosemide 40 MG tablet Commonly known as: LASIX Take 0.5 tablets (20 mg total) by mouth daily. What changed:  how much to take when to take this   gabapentin 600 MG tablet Commonly known as: NEURONTIN Take 600 mg by mouth 2 (two) times daily.   levothyroxine 25 MCG tablet Commonly known as: SYNTHROID Take 1 tablet by mouth daily.   metoprolol succinate 25 MG 24 hr tablet Commonly known as: TOPROL-XL Take 1 tablet (25 mg total) by mouth daily. What changed: how much to take   multivitamin with minerals Tabs tablet Take 1 tablet by mouth daily.   nitroGLYCERIN 0.4 MG SL tablet Commonly known as: NITROSTAT Place 0.4 mg under the tongue every 5 (five) minutes as needed for chest pain.   oxyCODONE 10 mg 12 hr tablet Commonly known as: OXYCONTIN Take 1 tablet (10 mg total) by mouth every 12 (twelve) hours.   polyethylene glycol 17 g packet Commonly known as: MIRALAX / GLYCOLAX Take 17 g by mouth daily as needed for mild constipation.   ramipril 10 MG capsule Commonly known as: ALTACE Take 10 mg by mouth daily.   tiZANidine 2 MG tablet Commonly known as: ZANAFLEX Take 2 mg by mouth at bedtime.   warfarin 5 MG tablet Commonly known as: Coumadin Take 0.5 tablets (2.5 mg total) by mouth daily at 4 PM. Adjust according to INR What changed:  how much to take when to take  this additional instructions               Discharge Care Instructions  (From admission, onward)           Start     Ordered   08/16/21 0000  Discharge wound care:       Comments: Wound care  Daily      Comments: Apply Santyl to the right heel, RLE wound, and coccyx/sacral area in a nickel thick layer. Cover with a saline moistened gauze, then dry gauze or ABD pad, secure with kerlex on the right foot and leg. Top with a sacral foam dressing on the buttocks wounds.  Change daily  07/31/21 1245      07/31/21 1116    Wound care  Every 3 days     Comments: Place a small piece of calcium alginate Kellie Simmering # [id number]) over skin tear, cover with vaseline gauze  left dorsal hand over with a foam dressing. Change every 3 days.  07/31/21 1115  07/31/21 1114    Wound care  Every shift       07/31/21 1115   08/16/21 0819            If you experience worsening of your admission symptoms, develop shortness of breath, life threatening emergency, suicidal or homicidal thoughts you must seek medical attention immediately by calling 911 or calling your MD immediately  if symptoms less severe.  You Must read complete instructions/literature along with all the possible adverse reactions/side effects for all the Medicines you take and that have been prescribed to you. Take any new Medicines after you have completely understood and accept all the possible adverse reactions/side effects.   Please note  You were cared for by a hospitalist during your hospital stay. If you have any questions about your discharge medications or the care you received while you were in the hospital after you are discharged, you can call the unit and asked to speak with the hospitalist on call if the hospitalist that took care of you is not available. Once you are discharged, your primary care physician will handle any further medical issues. Please note that NO REFILLS for any discharge medications will be authorized  once you are discharged, as it is imperative that you return to your primary care physician (or establish a relationship with a primary care physician if you do not have one) for your aftercare needs so that they can reassess your need for medications and monitor your lab values. Today   SUBJECTIVE  No new complaints   VITAL SIGNS:  Blood pressure (!) 141/48, pulse 72, temperature 99.1 F (37.3 C), temperature source Oral, resp. rate 18, height 5\' 2"  (1.575 m), weight 83.9 kg, SpO2 98 %.  I/O:   Intake/Output Summary (Last 24 hours) at 08/16/2021 5956 Last data filed at 08/16/2021 0526 Gross per 24 hour  Intake 358 ml  Output 150 ml  Net 208 ml    PHYSICAL EXAMINATION:  GENERAL:  88 y.o.-year-old patient lying in the bed with no acute distress. weak LUNGS: Normal breath sounds bilaterally, no wheezing, rales, rhonchi.  CARDIOVASCULAR: S1, S2 normal. No murmurs, rubs, or gallops.  ABDOMEN: Soft, nontender, nondistended. Bowel sounds present.  EXTREMITIES:LE dressing + NEUROLOGIC: nonfocal SKIN:  Pressure Injury Stage II -  Partial thickness loss of dermis presenting as a shallow open ulcer with a red, pink wound bed without slough. Patient have small stage 2 (Active)     Location: Buttocks  Location Orientation: Left  Staging: Stage II -  Partial thickness loss of dermis presenting as a shallow open ulcer with a red, pink wound bed without slough.  Wound Description (Comments): Patient have small stage 2  Present on Admission: Yes     Pressure Injury 07/27/21 Heel Right Stage 3 -  Full thickness tissue loss. Subcutaneous fat may be visible but bone, tendon or muscle are NOT exposed. (Active)  07/27/21 0612  Location: Heel  Location Orientation: Right  Staging: Stage 3 -  Full thickness tissue loss. Subcutaneous fat may be visible but bone, tendon or muscle are NOT exposed.  Wound Description (Comments):   Present on Admission:      Pressure Injury 07/27/21 Buttocks Bilateral  Deep Tissue Pressure Injury - Purple or maroon localized area of discolored intact skin or blood-filled blister due to damage of underlying soft tissue from pressure and/or shear. (Active)  07/27/21 3875  Location: Buttocks  Location Orientation: Bilateral  Staging: Deep Tissue Pressure Injury - Purple or maroon localized  area of discolored intact skin or blood-filled blister due to damage of underlying soft tissue from pressure and/or shear.  Wound Description (Comments):   Present on Admission:      Pressure Injury 08/08/21 Groin Right;Left;Medial Stage 2 -  Partial thickness loss of dermis presenting as a shallow open injury with a red, pink wound bed without slough. (Active)  08/08/21 2130  Location: Groin  Location Orientation: Right;Left;Medial  Staging: Stage 2 -  Partial thickness loss of dermis presenting as a shallow open injury with a red, pink wound bed without slough.  Wound Description (Comments):   Present on Admission:             DATA REVIEW:   CBC  Recent Labs  Lab 08/15/21 0541  WBC 8.5  HGB 8.9*  HCT 28.6*  PLT 121*    Chemistries  Recent Labs  Lab 08/14/21 1022  NA 137  K 3.9  CL 105  CO2 25  GLUCOSE 132*  BUN 16  CREATININE 0.73  CALCIUM 8.1*  MG 2.4    Microbiology Results   Recent Results (from the past 240 hour(s))  Resp Panel by RT-PCR (Flu A&B, Covid) Nasopharyngeal Swab     Status: None   Collection Time: 08/15/21  3:00 PM   Specimen: Nasopharyngeal Swab; Nasopharyngeal(NP) swabs in vial transport medium  Result Value Ref Range Status   SARS Coronavirus 2 by RT PCR NEGATIVE NEGATIVE Final    Comment: (NOTE) SARS-CoV-2 target nucleic acids are NOT DETECTED.  The SARS-CoV-2 RNA is generally detectable in upper respiratory specimens during the acute phase of infection. The lowest concentration of SARS-CoV-2 viral copies this assay can detect is 138 copies/mL. A negative result does not preclude SARS-Cov-2 infection and should not  be used as the sole basis for treatment or other patient management decisions. A negative result may occur with  improper specimen collection/handling, submission of specimen other than nasopharyngeal swab, presence of viral mutation(s) within the areas targeted by this assay, and inadequate number of viral copies(<138 copies/mL). A negative result must be combined with clinical observations, patient history, and epidemiological information. The expected result is Negative.  Fact Sheet for Patients:  EntrepreneurPulse.com.au  Fact Sheet for Healthcare Providers:  IncredibleEmployment.be  This test is no t yet approved or cleared by the [place] FDA and  has been authorized for detection and/or diagnosis of SARS-CoV-2 by FDA under an Emergency Use Authorization (EUA). This EUA will remain  in effect (meaning this test can be used) for the duration of the COVID-19 declaration under Section 564(b)(1) of the Act, 21 U.S.C.section 360bbb-3(b)(1), unless the authorization is terminated  or revoked sooner.       Influenza A by PCR NEGATIVE NEGATIVE Final   Influenza B by PCR NEGATIVE NEGATIVE Final    Comment: (NOTE) The Xpert Xpress SARS-CoV-2/FLU/RSV plus assay is intended as an aid in the diagnosis of influenza from Nasopharyngeal swab specimens and should not be used as a sole basis for treatment. Nasal washings and aspirates are unacceptable for Xpert Xpress SARS-CoV-2/FLU/RSV testing.  Fact Sheet for Patients: EntrepreneurPulse.com.au  Fact Sheet for Healthcare Providers: IncredibleEmployment.be  This test is not yet approved or cleared by the [place] FDA and has been authorized for detection and/or diagnosis of SARS-CoV-2 by FDA under an Emergency Use Authorization (EUA). This EUA will remain in effect (meaning this test can be used) for the duration of the COVID-19 declaration under Section  564(b)(1) of the Act, 21 U.S.C. section 360bbb-3(b)(1), unless  the authorization is terminated or revoked.  Performed at [facility], [address], [place],  [address]     RADIOLOGY:  No results found.   CODE STATUS:     Code Status Orders  (From admission, onward)           Start     Ordered   08/02/21 1343  Do not attempt resuscitation (DNR)  Continuous       Question Answer Comment  In the event of cardiac or respiratory ARREST Do not call a code blue   In the event of cardiac or respiratory ARREST Do not perform Intubation, CPR, defibrillation or ACLS   In the event of cardiac or respiratory ARREST Use medication by any route, position, wound care, and other measures to relive pain and suffering. May use oxygen, suction and manual treatment of airway obstruction as needed for comfort.      08/02/21 1342           Code Status History     Date Active Date Inactive Code Status Order ID Comments User Context   07/26/2021 0322 08/02/2021 1342 Full Code [id number]  Athena Masse, MD ED   05/13/2018 2051 05/19/2018 1859 Full Code [id number]  Hillary Bow, MD ED      Advance Directive Documentation    Flowsheet Row Most Recent Value  Type of Advance Directive Healthcare Power of Attorney  Pre-existing out of facility DNR order (yellow form or pink MOST form) --  "MOST" Form in Place? --        TOTAL TIME TAKING CARE OF THIS PATIENT: 40 minutes.    Fritzi Mandes M.D  [facility]    CC: Primary care physician; Adin Hector, MD

## 2021-08-16 NOTE — Progress Notes (Signed)
Patient is being discharged to [facility]. Report called to [facility] has been placed in discharge packet for EMS. Portable DNR is present. Ivs have been removed. Son called and updated. Daughter in law at bedside when EMS picked her up. Patient has left facility.

## 2021-08-16 NOTE — Care Management Important Message (Signed)
Important Message  Patient Details  Name: Rhonda Saunders MRN: [id number] Date of Birth: 03-03-1932   Medicare Important Message Given:  Yes     [name] A Voncille Simm 08/16/2021, 9:53 AM

## 2021-08-16 NOTE — Progress Notes (Signed)
[facility] Hospital Liaison Note  Notified by [facility] manager of patient/family request for [facility] Palliative services at facility after discharge.  [facility] hospital liaison will follow patient for discharge disposition.  Please call with any hospice or outpatient palliative care related questions.  Thank you for the opportunity to participate in this patient's care.  Nadene Rubins, RN, BSN [place] [phone]

## 2021-08-16 NOTE — Plan of Care (Signed)
  Problem: Clinical Measurements: Goal: Will remain free from infection Outcome: Progressing   Problem: Pain Managment: Goal: General experience of comfort will improve Outcome: Progressing   Problem: Safety: Goal: Ability to remain free from injury will improve Outcome: Progressing   

## 2021-09-19 DEATH — deceased
